# Patient Record
Sex: Female | Born: 1955 | Race: White | Hispanic: No | Marital: Married | Smoking: Former smoker
Health system: Southern US, Community
[De-identification: ages and names within clinical notes are randomized; demographics above are authoritative.]

## PROBLEM LIST (undated history)

## (undated) DIAGNOSIS — N184 Chronic kidney disease, stage 4 (severe): Secondary | ICD-10-CM

## (undated) DIAGNOSIS — J449 Chronic obstructive pulmonary disease, unspecified: Secondary | ICD-10-CM

## (undated) DIAGNOSIS — K7460 Unspecified cirrhosis of liver: Secondary | ICD-10-CM

## (undated) DIAGNOSIS — F32A Depression, unspecified: Secondary | ICD-10-CM

## (undated) DIAGNOSIS — K7581 Nonalcoholic steatohepatitis (NASH): Secondary | ICD-10-CM

## (undated) DIAGNOSIS — I10 Essential (primary) hypertension: Secondary | ICD-10-CM

## (undated) DIAGNOSIS — F329 Major depressive disorder, single episode, unspecified: Secondary | ICD-10-CM

## (undated) DIAGNOSIS — D696 Thrombocytopenia, unspecified: Secondary | ICD-10-CM

## (undated) DIAGNOSIS — N200 Calculus of kidney: Secondary | ICD-10-CM

## (undated) HISTORY — PX: CHOLECYSTECTOMY: SHX55

## (undated) HISTORY — PX: GASTRIC BYPASS: SHX52

---

## 2004-09-15 ENCOUNTER — Ambulatory Visit: Payer: Self-pay | Admitting: Oncology

## 2015-02-23 DIAGNOSIS — R918 Other nonspecific abnormal finding of lung field: Secondary | ICD-10-CM | POA: Diagnosis not present

## 2015-02-23 DIAGNOSIS — R252 Cramp and spasm: Secondary | ICD-10-CM | POA: Diagnosis not present

## 2015-02-23 DIAGNOSIS — R05 Cough: Secondary | ICD-10-CM | POA: Diagnosis not present

## 2015-02-23 DIAGNOSIS — K766 Portal hypertension: Secondary | ICD-10-CM | POA: Diagnosis not present

## 2015-02-23 DIAGNOSIS — R0789 Other chest pain: Secondary | ICD-10-CM | POA: Diagnosis not present

## 2015-02-23 DIAGNOSIS — K769 Liver disease, unspecified: Secondary | ICD-10-CM | POA: Diagnosis not present

## 2015-02-23 DIAGNOSIS — K7290 Hepatic failure, unspecified without coma: Secondary | ICD-10-CM | POA: Diagnosis not present

## 2015-02-23 DIAGNOSIS — Z23 Encounter for immunization: Secondary | ICD-10-CM | POA: Diagnosis not present

## 2015-02-23 DIAGNOSIS — D508 Other iron deficiency anemias: Secondary | ICD-10-CM | POA: Diagnosis not present

## 2015-02-23 DIAGNOSIS — Z87891 Personal history of nicotine dependence: Secondary | ICD-10-CM | POA: Diagnosis not present

## 2015-02-23 DIAGNOSIS — K7460 Unspecified cirrhosis of liver: Secondary | ICD-10-CM | POA: Diagnosis not present

## 2015-02-23 DIAGNOSIS — G4733 Obstructive sleep apnea (adult) (pediatric): Secondary | ICD-10-CM | POA: Diagnosis not present

## 2015-02-23 DIAGNOSIS — K7581 Nonalcoholic steatohepatitis (NASH): Secondary | ICD-10-CM | POA: Diagnosis not present

## 2015-02-23 DIAGNOSIS — R042 Hemoptysis: Secondary | ICD-10-CM | POA: Diagnosis not present

## 2015-02-23 DIAGNOSIS — I8510 Secondary esophageal varices without bleeding: Secondary | ICD-10-CM | POA: Diagnosis not present

## 2015-02-23 DIAGNOSIS — E8881 Metabolic syndrome: Secondary | ICD-10-CM | POA: Diagnosis not present

## 2015-03-07 DIAGNOSIS — D500 Iron deficiency anemia secondary to blood loss (chronic): Secondary | ICD-10-CM | POA: Diagnosis not present

## 2015-03-11 DIAGNOSIS — R938 Abnormal findings on diagnostic imaging of other specified body structures: Secondary | ICD-10-CM | POA: Diagnosis not present

## 2015-03-11 DIAGNOSIS — R05 Cough: Secondary | ICD-10-CM | POA: Diagnosis not present

## 2015-03-11 DIAGNOSIS — R042 Hemoptysis: Secondary | ICD-10-CM | POA: Diagnosis not present

## 2015-04-07 DIAGNOSIS — N3000 Acute cystitis without hematuria: Secondary | ICD-10-CM | POA: Diagnosis not present

## 2015-04-07 DIAGNOSIS — N3001 Acute cystitis with hematuria: Secondary | ICD-10-CM | POA: Diagnosis not present

## 2015-04-07 DIAGNOSIS — M159 Polyosteoarthritis, unspecified: Secondary | ICD-10-CM | POA: Diagnosis not present

## 2015-04-11 DIAGNOSIS — K7460 Unspecified cirrhosis of liver: Secondary | ICD-10-CM | POA: Diagnosis not present

## 2015-04-11 DIAGNOSIS — K7581 Nonalcoholic steatohepatitis (NASH): Secondary | ICD-10-CM | POA: Diagnosis not present

## 2015-04-12 DIAGNOSIS — K7460 Unspecified cirrhosis of liver: Secondary | ICD-10-CM | POA: Diagnosis not present

## 2015-04-12 DIAGNOSIS — R3129 Other microscopic hematuria: Secondary | ICD-10-CM | POA: Diagnosis not present

## 2015-04-12 DIAGNOSIS — M159 Polyosteoarthritis, unspecified: Secondary | ICD-10-CM | POA: Diagnosis not present

## 2015-04-12 DIAGNOSIS — E8881 Metabolic syndrome: Secondary | ICD-10-CM | POA: Diagnosis not present

## 2015-04-13 DIAGNOSIS — M5136 Other intervertebral disc degeneration, lumbar region: Secondary | ICD-10-CM | POA: Diagnosis not present

## 2015-04-13 DIAGNOSIS — M5442 Lumbago with sciatica, left side: Secondary | ICD-10-CM | POA: Diagnosis not present

## 2015-04-13 DIAGNOSIS — M5441 Lumbago with sciatica, right side: Secondary | ICD-10-CM | POA: Diagnosis not present

## 2015-04-13 DIAGNOSIS — G8929 Other chronic pain: Secondary | ICD-10-CM | POA: Diagnosis not present

## 2015-04-18 DIAGNOSIS — R079 Chest pain, unspecified: Secondary | ICD-10-CM | POA: Diagnosis not present

## 2015-04-18 DIAGNOSIS — R531 Weakness: Secondary | ICD-10-CM | POA: Diagnosis not present

## 2015-04-20 DIAGNOSIS — M5442 Lumbago with sciatica, left side: Secondary | ICD-10-CM | POA: Diagnosis not present

## 2015-04-20 DIAGNOSIS — M6281 Muscle weakness (generalized): Secondary | ICD-10-CM | POA: Diagnosis not present

## 2015-04-20 DIAGNOSIS — M5136 Other intervertebral disc degeneration, lumbar region: Secondary | ICD-10-CM | POA: Diagnosis not present

## 2015-05-10 DIAGNOSIS — M5442 Lumbago with sciatica, left side: Secondary | ICD-10-CM | POA: Diagnosis not present

## 2015-05-10 DIAGNOSIS — M5136 Other intervertebral disc degeneration, lumbar region: Secondary | ICD-10-CM | POA: Diagnosis not present

## 2015-05-11 DIAGNOSIS — M5442 Lumbago with sciatica, left side: Secondary | ICD-10-CM | POA: Diagnosis not present

## 2015-05-11 DIAGNOSIS — G8929 Other chronic pain: Secondary | ICD-10-CM | POA: Diagnosis not present

## 2015-05-11 DIAGNOSIS — M5441 Lumbago with sciatica, right side: Secondary | ICD-10-CM | POA: Diagnosis not present

## 2015-05-11 DIAGNOSIS — M5136 Other intervertebral disc degeneration, lumbar region: Secondary | ICD-10-CM | POA: Diagnosis not present

## 2015-05-13 DIAGNOSIS — M5136 Other intervertebral disc degeneration, lumbar region: Secondary | ICD-10-CM | POA: Diagnosis not present

## 2015-05-13 DIAGNOSIS — M5442 Lumbago with sciatica, left side: Secondary | ICD-10-CM | POA: Diagnosis not present

## 2015-05-23 DIAGNOSIS — R531 Weakness: Secondary | ICD-10-CM | POA: Diagnosis not present

## 2015-05-23 DIAGNOSIS — K7581 Nonalcoholic steatohepatitis (NASH): Secondary | ICD-10-CM | POA: Diagnosis not present

## 2015-05-23 DIAGNOSIS — E7220 Disorder of urea cycle metabolism, unspecified: Secondary | ICD-10-CM | POA: Diagnosis not present

## 2015-05-31 DIAGNOSIS — I10 Essential (primary) hypertension: Secondary | ICD-10-CM | POA: Diagnosis not present

## 2015-05-31 DIAGNOSIS — G4733 Obstructive sleep apnea (adult) (pediatric): Secondary | ICD-10-CM | POA: Diagnosis not present

## 2015-05-31 DIAGNOSIS — K7469 Other cirrhosis of liver: Secondary | ICD-10-CM | POA: Diagnosis not present

## 2015-05-31 DIAGNOSIS — K7581 Nonalcoholic steatohepatitis (NASH): Secondary | ICD-10-CM | POA: Diagnosis not present

## 2015-05-31 DIAGNOSIS — E1069 Type 1 diabetes mellitus with other specified complication: Secondary | ICD-10-CM | POA: Diagnosis not present

## 2015-05-31 DIAGNOSIS — Z87891 Personal history of nicotine dependence: Secondary | ICD-10-CM | POA: Diagnosis not present

## 2015-05-31 DIAGNOSIS — Z8639 Personal history of other endocrine, nutritional and metabolic disease: Secondary | ICD-10-CM | POA: Diagnosis not present

## 2015-05-31 DIAGNOSIS — K219 Gastro-esophageal reflux disease without esophagitis: Secondary | ICD-10-CM | POA: Diagnosis not present

## 2015-05-31 DIAGNOSIS — K7460 Unspecified cirrhosis of liver: Secondary | ICD-10-CM | POA: Diagnosis not present

## 2015-05-31 DIAGNOSIS — G2581 Restless legs syndrome: Secondary | ICD-10-CM | POA: Diagnosis not present

## 2015-06-26 DIAGNOSIS — D696 Thrombocytopenia, unspecified: Secondary | ICD-10-CM | POA: Diagnosis not present

## 2015-06-26 DIAGNOSIS — N179 Acute kidney failure, unspecified: Secondary | ICD-10-CM | POA: Diagnosis not present

## 2015-06-26 DIAGNOSIS — M1990 Unspecified osteoarthritis, unspecified site: Secondary | ICD-10-CM | POA: Diagnosis not present

## 2015-06-26 DIAGNOSIS — K7581 Nonalcoholic steatohepatitis (NASH): Secondary | ICD-10-CM | POA: Diagnosis not present

## 2015-06-26 DIAGNOSIS — G9341 Metabolic encephalopathy: Secondary | ICD-10-CM | POA: Diagnosis not present

## 2015-06-26 DIAGNOSIS — Z79899 Other long term (current) drug therapy: Secondary | ICD-10-CM | POA: Diagnosis not present

## 2015-06-26 DIAGNOSIS — I10 Essential (primary) hypertension: Secondary | ICD-10-CM | POA: Diagnosis not present

## 2015-06-26 DIAGNOSIS — K219 Gastro-esophageal reflux disease without esophagitis: Secondary | ICD-10-CM | POA: Diagnosis not present

## 2015-06-26 DIAGNOSIS — R079 Chest pain, unspecified: Secondary | ICD-10-CM | POA: Diagnosis not present

## 2015-06-26 DIAGNOSIS — K7290 Hepatic failure, unspecified without coma: Secondary | ICD-10-CM | POA: Diagnosis not present

## 2015-06-26 DIAGNOSIS — Z881 Allergy status to other antibiotic agents status: Secondary | ICD-10-CM | POA: Diagnosis not present

## 2015-06-26 DIAGNOSIS — Z888 Allergy status to other drugs, medicaments and biological substances status: Secondary | ICD-10-CM | POA: Diagnosis not present

## 2015-06-27 DIAGNOSIS — K7290 Hepatic failure, unspecified without coma: Secondary | ICD-10-CM | POA: Diagnosis not present

## 2015-06-27 DIAGNOSIS — K7581 Nonalcoholic steatohepatitis (NASH): Secondary | ICD-10-CM | POA: Diagnosis not present

## 2015-06-27 DIAGNOSIS — I10 Essential (primary) hypertension: Secondary | ICD-10-CM | POA: Diagnosis not present

## 2015-06-27 DIAGNOSIS — D696 Thrombocytopenia, unspecified: Secondary | ICD-10-CM | POA: Diagnosis not present

## 2015-06-28 DIAGNOSIS — K7581 Nonalcoholic steatohepatitis (NASH): Secondary | ICD-10-CM | POA: Diagnosis not present

## 2015-06-28 DIAGNOSIS — K7290 Hepatic failure, unspecified without coma: Secondary | ICD-10-CM | POA: Diagnosis not present

## 2015-06-28 DIAGNOSIS — I10 Essential (primary) hypertension: Secondary | ICD-10-CM | POA: Diagnosis not present

## 2015-06-28 DIAGNOSIS — D696 Thrombocytopenia, unspecified: Secondary | ICD-10-CM | POA: Diagnosis not present

## 2015-07-05 DIAGNOSIS — G4733 Obstructive sleep apnea (adult) (pediatric): Secondary | ICD-10-CM | POA: Diagnosis not present

## 2015-07-05 DIAGNOSIS — G2581 Restless legs syndrome: Secondary | ICD-10-CM | POA: Diagnosis not present

## 2015-07-05 DIAGNOSIS — R69 Illness, unspecified: Secondary | ICD-10-CM | POA: Diagnosis not present

## 2015-07-06 DIAGNOSIS — M159 Polyosteoarthritis, unspecified: Secondary | ICD-10-CM | POA: Diagnosis not present

## 2015-07-06 DIAGNOSIS — J301 Allergic rhinitis due to pollen: Secondary | ICD-10-CM | POA: Diagnosis not present

## 2015-07-06 DIAGNOSIS — E8881 Metabolic syndrome: Secondary | ICD-10-CM | POA: Diagnosis not present

## 2015-07-06 DIAGNOSIS — K7290 Hepatic failure, unspecified without coma: Secondary | ICD-10-CM | POA: Diagnosis not present

## 2015-07-06 DIAGNOSIS — M5136 Other intervertebral disc degeneration, lumbar region: Secondary | ICD-10-CM | POA: Diagnosis not present

## 2015-07-06 DIAGNOSIS — K7460 Unspecified cirrhosis of liver: Secondary | ICD-10-CM | POA: Diagnosis not present

## 2015-07-06 DIAGNOSIS — M5442 Lumbago with sciatica, left side: Secondary | ICD-10-CM | POA: Diagnosis not present

## 2015-07-06 DIAGNOSIS — G8929 Other chronic pain: Secondary | ICD-10-CM | POA: Diagnosis not present

## 2015-07-06 DIAGNOSIS — I119 Hypertensive heart disease without heart failure: Secondary | ICD-10-CM | POA: Diagnosis not present

## 2015-07-06 DIAGNOSIS — M5441 Lumbago with sciatica, right side: Secondary | ICD-10-CM | POA: Diagnosis not present

## 2015-07-06 DIAGNOSIS — K760 Fatty (change of) liver, not elsewhere classified: Secondary | ICD-10-CM | POA: Diagnosis not present

## 2015-07-06 DIAGNOSIS — N179 Acute kidney failure, unspecified: Secondary | ICD-10-CM | POA: Diagnosis not present

## 2015-07-15 DIAGNOSIS — M545 Low back pain: Secondary | ICD-10-CM | POA: Diagnosis not present

## 2015-07-19 DIAGNOSIS — K625 Hemorrhage of anus and rectum: Secondary | ICD-10-CM | POA: Diagnosis not present

## 2015-07-22 DIAGNOSIS — M5442 Lumbago with sciatica, left side: Secondary | ICD-10-CM | POA: Diagnosis not present

## 2015-07-22 DIAGNOSIS — M5441 Lumbago with sciatica, right side: Secondary | ICD-10-CM | POA: Diagnosis not present

## 2015-07-22 DIAGNOSIS — G8929 Other chronic pain: Secondary | ICD-10-CM | POA: Diagnosis not present

## 2015-07-22 DIAGNOSIS — M5136 Other intervertebral disc degeneration, lumbar region: Secondary | ICD-10-CM | POA: Diagnosis not present

## 2015-08-12 DIAGNOSIS — M4726 Other spondylosis with radiculopathy, lumbar region: Secondary | ICD-10-CM | POA: Diagnosis not present

## 2015-08-12 DIAGNOSIS — M5136 Other intervertebral disc degeneration, lumbar region: Secondary | ICD-10-CM | POA: Diagnosis not present

## 2015-08-12 DIAGNOSIS — M5116 Intervertebral disc disorders with radiculopathy, lumbar region: Secondary | ICD-10-CM | POA: Diagnosis not present

## 2015-08-25 DIAGNOSIS — G4761 Periodic limb movement disorder: Secondary | ICD-10-CM | POA: Diagnosis not present

## 2015-09-05 DIAGNOSIS — M5441 Lumbago with sciatica, right side: Secondary | ICD-10-CM | POA: Diagnosis not present

## 2015-09-05 DIAGNOSIS — M5136 Other intervertebral disc degeneration, lumbar region: Secondary | ICD-10-CM | POA: Diagnosis not present

## 2015-09-05 DIAGNOSIS — G8929 Other chronic pain: Secondary | ICD-10-CM | POA: Diagnosis not present

## 2015-09-05 DIAGNOSIS — M5442 Lumbago with sciatica, left side: Secondary | ICD-10-CM | POA: Diagnosis not present

## 2015-09-12 DIAGNOSIS — E7220 Disorder of urea cycle metabolism, unspecified: Secondary | ICD-10-CM | POA: Diagnosis not present

## 2015-09-12 DIAGNOSIS — R0600 Dyspnea, unspecified: Secondary | ICD-10-CM | POA: Diagnosis not present

## 2015-09-12 DIAGNOSIS — R0602 Shortness of breath: Secondary | ICD-10-CM | POA: Diagnosis not present

## 2015-09-13 DIAGNOSIS — R0602 Shortness of breath: Secondary | ICD-10-CM | POA: Diagnosis not present

## 2015-09-13 DIAGNOSIS — R0600 Dyspnea, unspecified: Secondary | ICD-10-CM | POA: Diagnosis not present

## 2015-09-28 DIAGNOSIS — R0789 Other chest pain: Secondary | ICD-10-CM | POA: Diagnosis not present

## 2015-09-28 DIAGNOSIS — K7581 Nonalcoholic steatohepatitis (NASH): Secondary | ICD-10-CM | POA: Diagnosis not present

## 2015-09-28 DIAGNOSIS — K7460 Unspecified cirrhosis of liver: Secondary | ICD-10-CM | POA: Diagnosis not present

## 2015-09-28 DIAGNOSIS — Z8639 Personal history of other endocrine, nutritional and metabolic disease: Secondary | ICD-10-CM | POA: Diagnosis not present

## 2015-09-28 DIAGNOSIS — R5383 Other fatigue: Secondary | ICD-10-CM | POA: Diagnosis not present

## 2015-09-28 DIAGNOSIS — R0609 Other forms of dyspnea: Secondary | ICD-10-CM | POA: Diagnosis not present

## 2015-09-28 DIAGNOSIS — R600 Localized edema: Secondary | ICD-10-CM | POA: Diagnosis not present

## 2015-10-06 DIAGNOSIS — G8929 Other chronic pain: Secondary | ICD-10-CM | POA: Diagnosis not present

## 2015-10-06 DIAGNOSIS — M5441 Lumbago with sciatica, right side: Secondary | ICD-10-CM | POA: Diagnosis not present

## 2015-10-06 DIAGNOSIS — M5136 Other intervertebral disc degeneration, lumbar region: Secondary | ICD-10-CM | POA: Diagnosis not present

## 2015-10-06 DIAGNOSIS — M5442 Lumbago with sciatica, left side: Secondary | ICD-10-CM | POA: Diagnosis not present

## 2015-10-07 DIAGNOSIS — R0789 Other chest pain: Secondary | ICD-10-CM | POA: Diagnosis not present

## 2015-10-07 DIAGNOSIS — Z6841 Body Mass Index (BMI) 40.0 and over, adult: Secondary | ICD-10-CM | POA: Diagnosis not present

## 2015-10-07 DIAGNOSIS — R0602 Shortness of breath: Secondary | ICD-10-CM | POA: Diagnosis not present

## 2015-10-07 DIAGNOSIS — R079 Chest pain, unspecified: Secondary | ICD-10-CM | POA: Diagnosis not present

## 2015-10-12 DIAGNOSIS — Z0100 Encounter for examination of eyes and vision without abnormal findings: Secondary | ICD-10-CM | POA: Diagnosis not present

## 2015-10-12 DIAGNOSIS — H5213 Myopia, bilateral: Secondary | ICD-10-CM | POA: Diagnosis not present

## 2015-10-14 DIAGNOSIS — K766 Portal hypertension: Secondary | ICD-10-CM | POA: Diagnosis not present

## 2015-10-14 DIAGNOSIS — K769 Liver disease, unspecified: Secondary | ICD-10-CM | POA: Diagnosis not present

## 2015-10-14 DIAGNOSIS — K7460 Unspecified cirrhosis of liver: Secondary | ICD-10-CM | POA: Diagnosis not present

## 2015-10-14 DIAGNOSIS — R0609 Other forms of dyspnea: Secondary | ICD-10-CM | POA: Diagnosis not present

## 2015-10-14 DIAGNOSIS — L299 Pruritus, unspecified: Secondary | ICD-10-CM | POA: Diagnosis not present

## 2015-10-14 DIAGNOSIS — G2581 Restless legs syndrome: Secondary | ICD-10-CM | POA: Diagnosis not present

## 2015-10-14 DIAGNOSIS — H579 Unspecified disorder of eye and adnexa: Secondary | ICD-10-CM | POA: Diagnosis not present

## 2015-10-14 DIAGNOSIS — K7469 Other cirrhosis of liver: Secondary | ICD-10-CM | POA: Diagnosis not present

## 2015-10-14 DIAGNOSIS — R600 Localized edema: Secondary | ICD-10-CM | POA: Diagnosis not present

## 2015-10-14 DIAGNOSIS — K7290 Hepatic failure, unspecified without coma: Secondary | ICD-10-CM | POA: Diagnosis not present

## 2015-11-02 DIAGNOSIS — G4719 Other hypersomnia: Secondary | ICD-10-CM | POA: Diagnosis not present

## 2015-11-02 DIAGNOSIS — Z72821 Inadequate sleep hygiene: Secondary | ICD-10-CM | POA: Diagnosis not present

## 2015-11-02 DIAGNOSIS — G2581 Restless legs syndrome: Secondary | ICD-10-CM | POA: Diagnosis not present

## 2015-11-02 DIAGNOSIS — R69 Illness, unspecified: Secondary | ICD-10-CM | POA: Diagnosis not present

## 2015-11-08 DIAGNOSIS — E8770 Fluid overload, unspecified: Secondary | ICD-10-CM | POA: Diagnosis not present

## 2015-11-08 DIAGNOSIS — N3000 Acute cystitis without hematuria: Secondary | ICD-10-CM | POA: Diagnosis not present

## 2015-11-08 DIAGNOSIS — N390 Urinary tract infection, site not specified: Secondary | ICD-10-CM | POA: Diagnosis not present

## 2015-11-08 DIAGNOSIS — I10 Essential (primary) hypertension: Secondary | ICD-10-CM | POA: Diagnosis not present

## 2015-11-08 DIAGNOSIS — A419 Sepsis, unspecified organism: Secondary | ICD-10-CM | POA: Diagnosis not present

## 2015-11-08 DIAGNOSIS — N179 Acute kidney failure, unspecified: Secondary | ICD-10-CM | POA: Diagnosis not present

## 2015-11-08 DIAGNOSIS — R531 Weakness: Secondary | ICD-10-CM | POA: Diagnosis not present

## 2015-11-08 DIAGNOSIS — K7581 Nonalcoholic steatohepatitis (NASH): Secondary | ICD-10-CM | POA: Diagnosis not present

## 2015-11-08 DIAGNOSIS — D696 Thrombocytopenia, unspecified: Secondary | ICD-10-CM | POA: Diagnosis not present

## 2015-11-08 DIAGNOSIS — R404 Transient alteration of awareness: Secondary | ICD-10-CM | POA: Diagnosis not present

## 2015-11-08 DIAGNOSIS — I8500 Esophageal varices without bleeding: Secondary | ICD-10-CM | POA: Diagnosis not present

## 2015-11-08 DIAGNOSIS — B9620 Unspecified Escherichia coli [E. coli] as the cause of diseases classified elsewhere: Secondary | ICD-10-CM | POA: Diagnosis not present

## 2015-11-08 DIAGNOSIS — I361 Nonrheumatic tricuspid (valve) insufficiency: Secondary | ICD-10-CM | POA: Diagnosis not present

## 2015-11-08 DIAGNOSIS — E860 Dehydration: Secondary | ICD-10-CM | POA: Diagnosis not present

## 2015-11-08 DIAGNOSIS — Z6841 Body Mass Index (BMI) 40.0 and over, adult: Secondary | ICD-10-CM | POA: Diagnosis not present

## 2015-11-08 DIAGNOSIS — K7290 Hepatic failure, unspecified without coma: Secondary | ICD-10-CM | POA: Diagnosis not present

## 2015-11-09 DIAGNOSIS — I361 Nonrheumatic tricuspid (valve) insufficiency: Secondary | ICD-10-CM | POA: Diagnosis not present

## 2015-11-09 DIAGNOSIS — N179 Acute kidney failure, unspecified: Secondary | ICD-10-CM | POA: Diagnosis not present

## 2015-11-09 DIAGNOSIS — D696 Thrombocytopenia, unspecified: Secondary | ICD-10-CM

## 2015-11-09 DIAGNOSIS — N390 Urinary tract infection, site not specified: Secondary | ICD-10-CM | POA: Diagnosis not present

## 2015-11-09 DIAGNOSIS — A419 Sepsis, unspecified organism: Secondary | ICD-10-CM | POA: Diagnosis not present

## 2015-11-09 DIAGNOSIS — K7581 Nonalcoholic steatohepatitis (NASH): Secondary | ICD-10-CM

## 2015-11-09 DIAGNOSIS — K7290 Hepatic failure, unspecified without coma: Secondary | ICD-10-CM | POA: Diagnosis not present

## 2015-11-10 DIAGNOSIS — K7290 Hepatic failure, unspecified without coma: Secondary | ICD-10-CM | POA: Diagnosis not present

## 2015-11-10 DIAGNOSIS — A419 Sepsis, unspecified organism: Secondary | ICD-10-CM | POA: Diagnosis not present

## 2015-11-10 DIAGNOSIS — N179 Acute kidney failure, unspecified: Secondary | ICD-10-CM | POA: Diagnosis not present

## 2015-11-10 DIAGNOSIS — N390 Urinary tract infection, site not specified: Secondary | ICD-10-CM | POA: Diagnosis not present

## 2015-11-11 DIAGNOSIS — N179 Acute kidney failure, unspecified: Secondary | ICD-10-CM | POA: Diagnosis not present

## 2015-11-11 DIAGNOSIS — K7290 Hepatic failure, unspecified without coma: Secondary | ICD-10-CM | POA: Diagnosis not present

## 2015-11-11 DIAGNOSIS — A419 Sepsis, unspecified organism: Secondary | ICD-10-CM | POA: Diagnosis not present

## 2015-11-11 DIAGNOSIS — N390 Urinary tract infection, site not specified: Secondary | ICD-10-CM | POA: Diagnosis not present

## 2015-11-18 DIAGNOSIS — K644 Residual hemorrhoidal skin tags: Secondary | ICD-10-CM | POA: Diagnosis not present

## 2015-11-18 DIAGNOSIS — R69 Illness, unspecified: Secondary | ICD-10-CM | POA: Diagnosis not present

## 2015-11-18 DIAGNOSIS — K760 Fatty (change of) liver, not elsewhere classified: Secondary | ICD-10-CM | POA: Diagnosis not present

## 2015-11-18 DIAGNOSIS — G9341 Metabolic encephalopathy: Secondary | ICD-10-CM | POA: Diagnosis not present

## 2015-11-18 DIAGNOSIS — N3001 Acute cystitis with hematuria: Secondary | ICD-10-CM | POA: Diagnosis not present

## 2015-11-18 DIAGNOSIS — A4151 Sepsis due to Escherichia coli [E. coli]: Secondary | ICD-10-CM | POA: Diagnosis not present

## 2015-11-18 DIAGNOSIS — G2581 Restless legs syndrome: Secondary | ICD-10-CM | POA: Diagnosis not present

## 2015-11-18 DIAGNOSIS — I119 Hypertensive heart disease without heart failure: Secondary | ICD-10-CM | POA: Diagnosis not present

## 2015-11-18 DIAGNOSIS — K7200 Acute and subacute hepatic failure without coma: Secondary | ICD-10-CM | POA: Diagnosis not present

## 2015-11-18 DIAGNOSIS — K7460 Unspecified cirrhosis of liver: Secondary | ICD-10-CM | POA: Diagnosis not present

## 2015-12-06 DIAGNOSIS — G894 Chronic pain syndrome: Secondary | ICD-10-CM | POA: Diagnosis not present

## 2015-12-06 DIAGNOSIS — M5136 Other intervertebral disc degeneration, lumbar region: Secondary | ICD-10-CM | POA: Diagnosis not present

## 2015-12-06 DIAGNOSIS — M5442 Lumbago with sciatica, left side: Secondary | ICD-10-CM | POA: Diagnosis not present

## 2015-12-19 DIAGNOSIS — I10 Essential (primary) hypertension: Secondary | ICD-10-CM

## 2015-12-19 DIAGNOSIS — R0602 Shortness of breath: Secondary | ICD-10-CM | POA: Diagnosis not present

## 2015-12-19 DIAGNOSIS — K7581 Nonalcoholic steatohepatitis (NASH): Secondary | ICD-10-CM | POA: Diagnosis not present

## 2015-12-19 DIAGNOSIS — G2581 Restless legs syndrome: Secondary | ICD-10-CM | POA: Diagnosis not present

## 2015-12-19 DIAGNOSIS — Z79899 Other long term (current) drug therapy: Secondary | ICD-10-CM | POA: Diagnosis not present

## 2015-12-19 DIAGNOSIS — M1990 Unspecified osteoarthritis, unspecified site: Secondary | ICD-10-CM | POA: Diagnosis not present

## 2015-12-19 DIAGNOSIS — N183 Chronic kidney disease, stage 3 (moderate): Secondary | ICD-10-CM | POA: Diagnosis not present

## 2015-12-19 DIAGNOSIS — I129 Hypertensive chronic kidney disease with stage 1 through stage 4 chronic kidney disease, or unspecified chronic kidney disease: Secondary | ICD-10-CM | POA: Diagnosis not present

## 2015-12-19 DIAGNOSIS — K7290 Hepatic failure, unspecified without coma: Secondary | ICD-10-CM | POA: Diagnosis not present

## 2015-12-19 DIAGNOSIS — Z87891 Personal history of nicotine dependence: Secondary | ICD-10-CM | POA: Diagnosis not present

## 2015-12-19 DIAGNOSIS — K766 Portal hypertension: Secondary | ICD-10-CM | POA: Diagnosis not present

## 2015-12-19 DIAGNOSIS — D696 Thrombocytopenia, unspecified: Secondary | ICD-10-CM | POA: Diagnosis not present

## 2015-12-20 DIAGNOSIS — N183 Chronic kidney disease, stage 3 (moderate): Secondary | ICD-10-CM | POA: Diagnosis not present

## 2015-12-20 DIAGNOSIS — M1990 Unspecified osteoarthritis, unspecified site: Secondary | ICD-10-CM | POA: Diagnosis not present

## 2015-12-20 DIAGNOSIS — K7290 Hepatic failure, unspecified without coma: Secondary | ICD-10-CM | POA: Diagnosis not present

## 2015-12-28 DIAGNOSIS — Z79899 Other long term (current) drug therapy: Secondary | ICD-10-CM | POA: Diagnosis not present

## 2015-12-28 DIAGNOSIS — R600 Localized edema: Secondary | ICD-10-CM | POA: Diagnosis not present

## 2015-12-28 DIAGNOSIS — K7460 Unspecified cirrhosis of liver: Secondary | ICD-10-CM | POA: Diagnosis not present

## 2015-12-28 DIAGNOSIS — R350 Frequency of micturition: Secondary | ICD-10-CM | POA: Diagnosis not present

## 2015-12-28 DIAGNOSIS — E119 Type 2 diabetes mellitus without complications: Secondary | ICD-10-CM | POA: Diagnosis not present

## 2015-12-28 DIAGNOSIS — K7469 Other cirrhosis of liver: Secondary | ICD-10-CM | POA: Diagnosis not present

## 2015-12-28 DIAGNOSIS — W101XXA Fall (on)(from) sidewalk curb, initial encounter: Secondary | ICD-10-CM | POA: Diagnosis not present

## 2015-12-28 DIAGNOSIS — S8011XA Contusion of right lower leg, initial encounter: Secondary | ICD-10-CM | POA: Diagnosis not present

## 2015-12-28 DIAGNOSIS — I8510 Secondary esophageal varices without bleeding: Secondary | ICD-10-CM | POA: Diagnosis not present

## 2015-12-28 DIAGNOSIS — K7581 Nonalcoholic steatohepatitis (NASH): Secondary | ICD-10-CM | POA: Diagnosis not present

## 2015-12-28 DIAGNOSIS — K766 Portal hypertension: Secondary | ICD-10-CM | POA: Diagnosis not present

## 2016-01-02 DIAGNOSIS — M25561 Pain in right knee: Secondary | ICD-10-CM | POA: Diagnosis not present

## 2016-01-02 DIAGNOSIS — K7200 Acute and subacute hepatic failure without coma: Secondary | ICD-10-CM | POA: Diagnosis not present

## 2016-01-02 DIAGNOSIS — S80811A Abrasion, right lower leg, initial encounter: Secondary | ICD-10-CM | POA: Diagnosis not present

## 2016-01-02 DIAGNOSIS — S8011XA Contusion of right lower leg, initial encounter: Secondary | ICD-10-CM | POA: Diagnosis not present

## 2016-01-02 DIAGNOSIS — G9341 Metabolic encephalopathy: Secondary | ICD-10-CM | POA: Diagnosis not present

## 2016-01-06 DIAGNOSIS — N184 Chronic kidney disease, stage 4 (severe): Secondary | ICD-10-CM | POA: Diagnosis present

## 2016-01-06 HISTORY — DX: Chronic kidney disease, stage 4 (severe): N18.4

## 2016-01-15 ENCOUNTER — Inpatient Hospital Stay (HOSPITAL_COMMUNITY)
Admission: AD | Admit: 2016-01-15 | Discharge: 2016-01-21 | DRG: 377 | Disposition: A | Discharge status: Home / Self Care | Payer: Medicare HMO | Source: Other Acute Inpatient Hospital | Attending: Internal Medicine | Admitting: Internal Medicine

## 2016-01-15 DIAGNOSIS — E43 Unspecified severe protein-calorie malnutrition: Secondary | ICD-10-CM | POA: Diagnosis not present

## 2016-01-15 DIAGNOSIS — Z452 Encounter for adjustment and management of vascular access device: Secondary | ICD-10-CM

## 2016-01-15 DIAGNOSIS — E875 Hyperkalemia: Secondary | ICD-10-CM | POA: Diagnosis present

## 2016-01-15 DIAGNOSIS — Z79899 Other long term (current) drug therapy: Secondary | ICD-10-CM | POA: Diagnosis not present

## 2016-01-15 DIAGNOSIS — K2970 Gastritis, unspecified, without bleeding: Secondary | ICD-10-CM | POA: Diagnosis not present

## 2016-01-15 DIAGNOSIS — G9340 Encephalopathy, unspecified: Secondary | ICD-10-CM | POA: Diagnosis not present

## 2016-01-15 DIAGNOSIS — I129 Hypertensive chronic kidney disease with stage 1 through stage 4 chronic kidney disease, or unspecified chronic kidney disease: Secondary | ICD-10-CM | POA: Diagnosis present

## 2016-01-15 DIAGNOSIS — K625 Hemorrhage of anus and rectum: Secondary | ICD-10-CM | POA: Diagnosis not present

## 2016-01-15 DIAGNOSIS — R197 Diarrhea, unspecified: Secondary | ICD-10-CM | POA: Diagnosis not present

## 2016-01-15 DIAGNOSIS — N170 Acute kidney failure with tubular necrosis: Secondary | ICD-10-CM | POA: Diagnosis not present

## 2016-01-15 DIAGNOSIS — G2581 Restless legs syndrome: Secondary | ICD-10-CM | POA: Diagnosis present

## 2016-01-15 DIAGNOSIS — E872 Acidosis: Secondary | ICD-10-CM | POA: Diagnosis not present

## 2016-01-15 DIAGNOSIS — Z6841 Body Mass Index (BMI) 40.0 and over, adult: Secondary | ICD-10-CM

## 2016-01-15 DIAGNOSIS — R112 Nausea with vomiting, unspecified: Secondary | ICD-10-CM | POA: Diagnosis not present

## 2016-01-15 DIAGNOSIS — D689 Coagulation defect, unspecified: Secondary | ICD-10-CM | POA: Diagnosis not present

## 2016-01-15 DIAGNOSIS — D649 Anemia, unspecified: Secondary | ICD-10-CM | POA: Diagnosis not present

## 2016-01-15 DIAGNOSIS — Z9884 Bariatric surgery status: Secondary | ICD-10-CM

## 2016-01-15 DIAGNOSIS — R571 Hypovolemic shock: Secondary | ICD-10-CM | POA: Diagnosis present

## 2016-01-15 DIAGNOSIS — K922 Gastrointestinal hemorrhage, unspecified: Secondary | ICD-10-CM | POA: Diagnosis not present

## 2016-01-15 DIAGNOSIS — N179 Acute kidney failure, unspecified: Secondary | ICD-10-CM | POA: Diagnosis present

## 2016-01-15 DIAGNOSIS — F329 Major depressive disorder, single episode, unspecified: Secondary | ICD-10-CM | POA: Diagnosis present

## 2016-01-15 DIAGNOSIS — K284 Chronic or unspecified gastrojejunal ulcer with hemorrhage: Secondary | ICD-10-CM | POA: Diagnosis present

## 2016-01-15 DIAGNOSIS — Z87891 Personal history of nicotine dependence: Secondary | ICD-10-CM

## 2016-01-15 DIAGNOSIS — K7581 Nonalcoholic steatohepatitis (NASH): Secondary | ICD-10-CM | POA: Diagnosis not present

## 2016-01-15 DIAGNOSIS — D500 Iron deficiency anemia secondary to blood loss (chronic): Secondary | ICD-10-CM | POA: Diagnosis not present

## 2016-01-15 DIAGNOSIS — G9341 Metabolic encephalopathy: Secondary | ICD-10-CM | POA: Diagnosis not present

## 2016-01-15 DIAGNOSIS — N189 Chronic kidney disease, unspecified: Secondary | ICD-10-CM | POA: Diagnosis present

## 2016-01-15 DIAGNOSIS — D62 Acute posthemorrhagic anemia: Secondary | ICD-10-CM | POA: Diagnosis present

## 2016-01-15 DIAGNOSIS — K2961 Other gastritis with bleeding: Secondary | ICD-10-CM | POA: Diagnosis not present

## 2016-01-15 DIAGNOSIS — K254 Chronic or unspecified gastric ulcer with hemorrhage: Principal | ICD-10-CM | POA: Diagnosis present

## 2016-01-15 DIAGNOSIS — N289 Disorder of kidney and ureter, unspecified: Secondary | ICD-10-CM | POA: Diagnosis not present

## 2016-01-15 DIAGNOSIS — G4733 Obstructive sleep apnea (adult) (pediatric): Secondary | ICD-10-CM | POA: Diagnosis present

## 2016-01-15 DIAGNOSIS — R578 Other shock: Secondary | ICD-10-CM | POA: Diagnosis present

## 2016-01-15 DIAGNOSIS — E669 Obesity, unspecified: Secondary | ICD-10-CM | POA: Diagnosis present

## 2016-01-15 DIAGNOSIS — K7469 Other cirrhosis of liver: Secondary | ICD-10-CM | POA: Diagnosis present

## 2016-01-15 DIAGNOSIS — D6959 Other secondary thrombocytopenia: Secondary | ICD-10-CM | POA: Diagnosis present

## 2016-01-15 DIAGNOSIS — K289 Gastrojejunal ulcer, unspecified as acute or chronic, without hemorrhage or perforation: Secondary | ICD-10-CM | POA: Diagnosis not present

## 2016-01-15 DIAGNOSIS — R579 Shock, unspecified: Secondary | ICD-10-CM | POA: Diagnosis not present

## 2016-01-15 DIAGNOSIS — K259 Gastric ulcer, unspecified as acute or chronic, without hemorrhage or perforation: Secondary | ICD-10-CM | POA: Diagnosis not present

## 2016-01-15 DIAGNOSIS — I8510 Secondary esophageal varices without bleeding: Secondary | ICD-10-CM | POA: Diagnosis present

## 2016-01-15 DIAGNOSIS — K921 Melena: Secondary | ICD-10-CM | POA: Diagnosis not present

## 2016-01-15 DIAGNOSIS — K7460 Unspecified cirrhosis of liver: Secondary | ICD-10-CM | POA: Diagnosis not present

## 2016-01-15 HISTORY — DX: Unspecified cirrhosis of liver: K74.60

## 2016-01-15 HISTORY — DX: Unspecified cirrhosis of liver: K75.81

## 2016-01-15 LAB — GLUCOSE, CAPILLARY: Glucose-Capillary: 100 mg/dL — ABNORMAL HIGH (ref 65–99)

## 2016-01-15 MED ORDER — ALBUMIN HUMAN 25 % IV SOLN
12.5000 g | Freq: Once | INTRAVENOUS | Status: DC
  Filled 2016-01-15: qty 50

## 2016-01-15 MED ORDER — SODIUM CHLORIDE 0.9 % IV BOLUS (SEPSIS)
1000.0000 mL | Freq: Once | INTRAVENOUS | Status: AC
  Administered 2016-01-15: 1000 mL via INTRAVENOUS

## 2016-01-15 MED ORDER — SODIUM CHLORIDE 0.9 % IV SOLN
Freq: Once | INTRAVENOUS | Status: AC
  Administered 2016-01-16: 01:00:00 via INTRAVENOUS

## 2016-01-15 MED ORDER — HYDROCORTISONE NA SUCCINATE PF 100 MG IJ SOLR
100.0000 mg | Freq: Once | INTRAMUSCULAR | Status: AC
  Administered 2016-01-15: 100 mg via INTRAVENOUS
  Filled 2016-01-15: qty 2

## 2016-01-15 MED ORDER — ONDANSETRON HCL 4 MG/2ML IJ SOLN
4.0000 mg | Freq: Once | INTRAMUSCULAR | Status: AC
  Administered 2016-01-15: 4 mg via INTRAVENOUS
  Filled 2016-01-15: qty 2

## 2016-01-16 ENCOUNTER — Encounter (HOSPITAL_COMMUNITY): Payer: Self-pay | Admitting: Internal Medicine

## 2016-01-16 ENCOUNTER — Inpatient Hospital Stay (HOSPITAL_COMMUNITY): Payer: Medicare HMO

## 2016-01-16 ENCOUNTER — Encounter (HOSPITAL_COMMUNITY)
Admission: AD | Disposition: A | Discharge status: Home / Self Care | Payer: Self-pay | Source: Other Acute Inpatient Hospital | Attending: Pulmonary Disease

## 2016-01-16 DIAGNOSIS — E875 Hyperkalemia: Secondary | ICD-10-CM

## 2016-01-16 DIAGNOSIS — K922 Gastrointestinal hemorrhage, unspecified: Secondary | ICD-10-CM

## 2016-01-16 DIAGNOSIS — Z452 Encounter for adjustment and management of vascular access device: Secondary | ICD-10-CM

## 2016-01-16 DIAGNOSIS — R571 Hypovolemic shock: Secondary | ICD-10-CM

## 2016-01-16 DIAGNOSIS — I8510 Secondary esophageal varices without bleeding: Secondary | ICD-10-CM

## 2016-01-16 DIAGNOSIS — D62 Acute posthemorrhagic anemia: Secondary | ICD-10-CM

## 2016-01-16 DIAGNOSIS — G9340 Encephalopathy, unspecified: Secondary | ICD-10-CM

## 2016-01-16 DIAGNOSIS — K7581 Nonalcoholic steatohepatitis (NASH): Secondary | ICD-10-CM

## 2016-01-16 DIAGNOSIS — K2961 Other gastritis with bleeding: Secondary | ICD-10-CM | POA: Diagnosis present

## 2016-01-16 DIAGNOSIS — R579 Shock, unspecified: Secondary | ICD-10-CM

## 2016-01-16 DIAGNOSIS — K7460 Unspecified cirrhosis of liver: Secondary | ICD-10-CM

## 2016-01-16 HISTORY — PX: ESOPHAGOGASTRODUODENOSCOPY: SHX5428

## 2016-01-16 LAB — POCT I-STAT 3, ART BLOOD GAS (G3+)
BICARBONATE: 23.2 mmol/L (ref 20.0–28.0)
O2 Saturation: 100 %
PCO2 ART: 31.2 mmHg — AB (ref 32.0–48.0)
PH ART: 7.479 — AB (ref 7.350–7.450)
PO2 ART: 210 mmHg — AB (ref 83.0–108.0)
Patient temperature: 98.6
TCO2: 24 mmol/L (ref 0–100)

## 2016-01-16 LAB — BASIC METABOLIC PANEL
ANION GAP: 9 (ref 5–15)
ANION GAP: 9 (ref 5–15)
ANION GAP: 7 (ref 5–15)
BUN: 39 mg/dL — AB (ref 6–20)
BUN: 40 mg/dL — AB (ref 6–20)
BUN: 39 mg/dL — AB (ref 6–20)
CALCIUM: 8.7 mg/dL — AB (ref 8.9–10.3)
CALCIUM: 8.5 mg/dL — AB (ref 8.9–10.3)
CALCIUM: 8.4 mg/dL — AB (ref 8.9–10.3)
CO2: 20 mmol/L — AB (ref 22–32)
CO2: 22 mmol/L (ref 22–32)
CO2: 23 mmol/L (ref 22–32)
CREATININE: 2.64 mg/dL — AB (ref 0.44–1.00)
Chloride: 115 mmol/L — ABNORMAL HIGH (ref 101–111)
Chloride: 111 mmol/L (ref 101–111)
Chloride: 111 mmol/L (ref 101–111)
Creatinine, Ser: 2.7 mg/dL — ABNORMAL HIGH (ref 0.44–1.00)
Creatinine, Ser: 2.76 mg/dL — ABNORMAL HIGH (ref 0.44–1.00)
GFR calc Af Amer: 22 mL/min — ABNORMAL LOW (ref >60)
GFR calc Af Amer: 21 mL/min — ABNORMAL LOW (ref >60)
GFR calc Af Amer: 21 mL/min — ABNORMAL LOW (ref >60)
GFR calc non Af Amer: 18 mL/min — ABNORMAL LOW (ref >60)
GFR calc non Af Amer: 18 mL/min — ABNORMAL LOW (ref >60)
GFR, EST NON AFRICAN AMERICAN: 19 mL/min — AB (ref >60)
GLUCOSE: 174 mg/dL — AB (ref 65–99)
GLUCOSE: 162 mg/dL — AB (ref 65–99)
GLUCOSE: 187 mg/dL — AB (ref 65–99)
Potassium: 5.7 mmol/L — ABNORMAL HIGH (ref 3.5–5.1)
Potassium: 5.1 mmol/L (ref 3.5–5.1)
Potassium: 5 mmol/L (ref 3.5–5.1)
Sodium: 144 mmol/L (ref 135–145)
Sodium: 142 mmol/L (ref 135–145)
Sodium: 141 mmol/L (ref 135–145)

## 2016-01-16 LAB — CBC
HCT: 30.3 % — ABNORMAL LOW (ref 36.0–46.0)
HEMATOCRIT: 29.7 % — AB (ref 36.0–46.0)
HEMATOCRIT: 31.5 % — AB (ref 36.0–46.0)
HEMOGLOBIN: 10.3 g/dL — AB (ref 12.0–15.0)
HEMOGLOBIN: 10.3 g/dL — AB (ref 12.0–15.0)
HEMOGLOBIN: 10.7 g/dL — AB (ref 12.0–15.0)
MCH: 30.3 pg (ref 26.0–34.0)
MCH: 30.4 pg (ref 26.0–34.0)
MCH: 30.2 pg (ref 26.0–34.0)
MCHC: 34.7 g/dL (ref 30.0–36.0)
MCHC: 34 g/dL (ref 30.0–36.0)
MCHC: 34 g/dL (ref 30.0–36.0)
MCV: 87.6 fL (ref 78.0–100.0)
MCV: 89 fL (ref 78.0–100.0)
MCV: 89.1 fL (ref 78.0–100.0)
PLATELETS: 68 10*3/uL — AB (ref 150–400)
Platelets: 61 10*3/uL — ABNORMAL LOW (ref 150–400)
Platelets: 68 10*3/uL — ABNORMAL LOW (ref 150–400)
RBC: 3.39 MIL/uL — AB (ref 3.87–5.11)
RBC: 3.4 MIL/uL — AB (ref 3.87–5.11)
RBC: 3.54 MIL/uL — ABNORMAL LOW (ref 3.87–5.11)
RDW: 15.5 % (ref 11.5–15.5)
RDW: 15.6 % — ABNORMAL HIGH (ref 11.5–15.5)
RDW: 15.8 % — ABNORMAL HIGH (ref 11.5–15.5)
WBC: 13.4 10*3/uL — AB (ref 4.0–10.5)
WBC: 14.6 10*3/uL — AB (ref 4.0–10.5)
WBC: 14.9 10*3/uL — ABNORMAL HIGH (ref 4.0–10.5)

## 2016-01-16 LAB — CBC WITH DIFFERENTIAL/PLATELET
Basophils Absolute: 0.1 10*3/uL (ref 0.0–0.1)
Basophils Relative: 1 %
EOS ABS: 0.1 10*3/uL (ref 0.0–0.7)
EOS PCT: 1 %
HCT: 17.7 % — ABNORMAL LOW (ref 36.0–46.0)
Hemoglobin: 6 g/dL — CL (ref 12.0–15.0)
LYMPHS ABS: 2 10*3/uL (ref 0.7–4.0)
Lymphocytes Relative: 20 %
MCH: 33 pg (ref 26.0–34.0)
MCHC: 33.9 g/dL (ref 30.0–36.0)
MCV: 97.3 fL (ref 78.0–100.0)
MONO ABS: 0.4 10*3/uL (ref 0.1–1.0)
Monocytes Relative: 4 %
Neutro Abs: 7.3 10*3/uL (ref 1.7–7.7)
Neutrophils Relative %: 74 %
PLATELETS: 120 10*3/uL — AB (ref 150–400)
RBC: 1.82 MIL/uL — AB (ref 3.87–5.11)
RDW: 16.1 % — AB (ref 11.5–15.5)
WBC: 9.9 10*3/uL (ref 4.0–10.5)

## 2016-01-16 LAB — COMPREHENSIVE METABOLIC PANEL
ALT: 21 U/L (ref 14–54)
ANION GAP: 4 — AB (ref 5–15)
AST: 39 U/L (ref 15–41)
Albumin: 1.7 g/dL — ABNORMAL LOW (ref 3.5–5.0)
Alkaline Phosphatase: 69 U/L (ref 38–126)
BUN: 35 mg/dL — ABNORMAL HIGH (ref 6–20)
CHLORIDE: 116 mmol/L — AB (ref 101–111)
CO2: 19 mmol/L — AB (ref 22–32)
CREATININE: 2.55 mg/dL — AB (ref 0.44–1.00)
Calcium: 7.4 mg/dL — ABNORMAL LOW (ref 8.9–10.3)
GFR, EST AFRICAN AMERICAN: 23 mL/min — AB (ref >60)
GFR, EST NON AFRICAN AMERICAN: 20 mL/min — AB (ref >60)
Glucose, Bld: 107 mg/dL — ABNORMAL HIGH (ref 65–99)
SODIUM: 139 mmol/L (ref 135–145)
Total Bilirubin: 1.2 mg/dL (ref 0.3–1.2)
Total Protein: 3.7 g/dL — ABNORMAL LOW (ref 6.5–8.1)

## 2016-01-16 LAB — POCT I-STAT 4, (NA,K, GLUC, HGB,HCT)
GLUCOSE: 300 mg/dL — AB (ref 65–99)
HEMATOCRIT: 19 % — AB (ref 36.0–46.0)
HEMOGLOBIN: 6.5 g/dL — AB (ref 12.0–15.0)
POTASSIUM: 6.3 mmol/L — AB (ref 3.5–5.1)
SODIUM: 141 mmol/L (ref 135–145)

## 2016-01-16 LAB — BLOOD GAS, ARTERIAL
Acid-base deficit: 6.8 mmol/L — ABNORMAL HIGH (ref 0.0–2.0)
Bicarbonate: 17.2 mmol/L — ABNORMAL LOW (ref 20.0–28.0)
DRAWN BY: 418751
FIO2: 0.21
O2 Saturation: 98.4 %
PATIENT TEMPERATURE: 97.4
PH ART: 7.396 (ref 7.350–7.450)
pCO2 arterial: 28.4 mmHg — ABNORMAL LOW (ref 32.0–48.0)
pO2, Arterial: 101 mmHg (ref 83.0–108.0)

## 2016-01-16 LAB — POTASSIUM: Potassium: 7.1 mmol/L (ref 3.5–5.1)

## 2016-01-16 LAB — GLUCOSE, CAPILLARY
GLUCOSE-CAPILLARY: 110 mg/dL — AB (ref 65–99)
GLUCOSE-CAPILLARY: 153 mg/dL — AB (ref 65–99)
GLUCOSE-CAPILLARY: 123 mg/dL — AB (ref 65–99)

## 2016-01-16 LAB — MRSA PCR SCREENING: MRSA BY PCR: NEGATIVE

## 2016-01-16 LAB — LACTIC ACID, PLASMA
LACTIC ACID, VENOUS: 3.1 mmol/L — AB (ref 0.5–1.9)
LACTIC ACID, VENOUS: 2.6 mmol/L — AB (ref 0.5–1.9)
Lactic Acid, Venous: 3.4 mmol/L (ref 0.5–1.9)

## 2016-01-16 LAB — PROTIME-INR
INR: 1.74
INR: 2.02
PROTHROMBIN TIME: 23.1 s — AB (ref 11.4–15.2)
Prothrombin Time: 20.6 seconds — ABNORMAL HIGH (ref 11.4–15.2)

## 2016-01-16 LAB — AMMONIA: AMMONIA: 50 umol/L — AB (ref 9–35)

## 2016-01-16 LAB — TROPONIN I
TROPONIN I: 0.05 ng/mL — AB (ref <0.03)
Troponin I: 0.05 ng/mL (ref <0.03)
Troponin I: 0.04 ng/mL (ref <0.03)

## 2016-01-16 LAB — PREPARE RBC (CROSSMATCH)

## 2016-01-16 LAB — BLOOD PRODUCT ORDER (VERBAL) VERIFICATION

## 2016-01-16 LAB — ABO/RH: ABO/RH(D): AB POS

## 2016-01-16 LAB — APTT: aPTT: 35 seconds (ref 24–36)

## 2016-01-16 SURGERY — EGD (ESOPHAGOGASTRODUODENOSCOPY)
Anesthesia: Moderate Sedation

## 2016-01-16 MED ORDER — MORPHINE SULFATE (PF) 2 MG/ML IV SOLN
2.0000 mg | INTRAVENOUS | Status: DC | PRN
  Administered 2016-01-16 – 2016-01-18 (×2): 2 mg via INTRAVENOUS
  Filled 2016-01-16 (×2): qty 1

## 2016-01-16 MED ORDER — FUROSEMIDE 10 MG/ML IJ SOLN
60.0000 mg | Freq: Once | INTRAMUSCULAR | Status: AC
  Administered 2016-01-16: 60 mg via INTRAVENOUS
  Filled 2016-01-16: qty 6

## 2016-01-16 MED ORDER — SODIUM CHLORIDE 0.9 % IV SOLN: Freq: Once | INTRAVENOUS | Status: DC

## 2016-01-16 MED ORDER — SODIUM BICARBONATE 8.4 % IV SOLN: INTRAVENOUS | Status: DC

## 2016-01-16 MED ORDER — SODIUM CHLORIDE 0.9 % IV SOLN: INTRAVENOUS | Status: DC

## 2016-01-16 MED ORDER — INSULIN ASPART 100 UNIT/ML ~~LOC~~ SOLN
10.0000 [IU] | Freq: Once | SUBCUTANEOUS | Status: AC
  Administered 2016-01-16: 10 [IU] via INTRAVENOUS

## 2016-01-16 MED ORDER — NOREPINEPHRINE BITARTRATE 1 MG/ML IV SOLN
2.0000 ug/min | INTRAVENOUS | Status: DC
  Filled 2016-01-16: qty 4

## 2016-01-16 MED ORDER — SODIUM CHLORIDE 0.9 % IV SOLN
INTRAVENOUS | Status: DC
  Administered 2016-01-16: via INTRAVENOUS

## 2016-01-16 MED ORDER — DEXTROSE 50 % IV SOLN
50.0000 mL | Freq: Once | INTRAVENOUS | Status: AC
  Administered 2016-01-16: 50 mL via INTRAVENOUS
  Filled 2016-01-16: qty 50

## 2016-01-16 MED ORDER — FENTANYL CITRATE (PF) 100 MCG/2ML IJ SOLN
INTRAMUSCULAR | Status: DC | PRN
  Administered 2016-01-16: 25 ug via INTRAVENOUS

## 2016-01-16 MED ORDER — DIPHENHYDRAMINE HCL 50 MG/ML IJ SOLN
INTRAMUSCULAR | Status: DC | PRN
  Administered 2016-01-16: 25 mg via INTRAVENOUS

## 2016-01-16 MED ORDER — SODIUM CHLORIDE 0.9 % IV SOLN
Freq: Once | INTRAVENOUS | Status: AC
  Administered 2016-01-16: 01:00:00 via INTRAVENOUS

## 2016-01-16 MED ORDER — EPINEPHRINE HCL 0.1 MG/ML IJ SOSY
PREFILLED_SYRINGE | INTRAMUSCULAR | Status: AC
  Filled 2016-01-16: qty 10

## 2016-01-16 MED ORDER — SODIUM BICARBONATE 8.4 % IV SOLN
50.0000 meq | Freq: Once | INTRAVENOUS | Status: AC
  Administered 2016-01-16: 50 meq via INTRAVENOUS
  Filled 2016-01-16: qty 50

## 2016-01-16 MED ORDER — ALBUTEROL SULFATE (2.5 MG/3ML) 0.083% IN NEBU: 10.0000 mg | INHALATION_SOLUTION | Freq: Once | RESPIRATORY_TRACT | Status: DC

## 2016-01-16 MED ORDER — ONDANSETRON HCL 4 MG/2ML IJ SOLN
4.0000 mg | Freq: Four times a day (QID) | INTRAMUSCULAR | Status: DC | PRN
  Administered 2016-01-16: 4 mg via INTRAVENOUS
  Filled 2016-01-16: qty 2

## 2016-01-16 MED ORDER — CALCIUM CHLORIDE 10 % IV SOLN
INTRAVENOUS | Status: AC
  Administered 2016-01-16: 1000 mg
  Filled 2016-01-16: qty 10

## 2016-01-16 MED ORDER — VITAMIN K1 10 MG/ML IJ SOLN
10.0000 mg | Freq: Once | INTRAVENOUS | Status: AC
  Administered 2016-01-16: 10 mg via INTRAVENOUS
  Filled 2016-01-16: qty 1

## 2016-01-16 MED ORDER — DIPHENHYDRAMINE HCL 50 MG/ML IJ SOLN
INTRAMUSCULAR | Status: AC
Start: 2016-01-16 — End: 2016-01-16
  Filled 2016-01-16: qty 1

## 2016-01-16 MED ORDER — MIDAZOLAM HCL 10 MG/2ML IJ SOLN
INTRAMUSCULAR | Status: DC | PRN
Start: 2016-01-16 — End: 2016-01-16
  Administered 2016-01-16: 1 mg via INTRAVENOUS
  Administered 2016-01-16: 2 mg via INTRAVENOUS

## 2016-01-16 MED ORDER — BUTAMBEN-TETRACAINE-BENZOCAINE 2-2-14 % EX AERO
INHALATION_SPRAY | CUTANEOUS | Status: DC | PRN
Start: 2016-01-16 — End: 2016-01-16
  Administered 2016-01-16: 2 via TOPICAL

## 2016-01-16 MED ORDER — INSULIN ASPART 100 UNIT/ML IV SOLN: 10.0000 [IU] | Freq: Once | INTRAVENOUS | Status: DC

## 2016-01-16 MED ORDER — DEXTROSE 50 % IV SOLN
INTRAVENOUS | Status: AC
  Administered 2016-01-16: 50 mL
  Filled 2016-01-16: qty 50

## 2016-01-16 MED ORDER — DEXTROSE 10 % IV SOLN: Freq: Once | INTRAVENOUS | Status: DC

## 2016-01-16 MED ORDER — MIDAZOLAM HCL 5 MG/ML IJ SOLN
INTRAMUSCULAR | Status: AC
  Filled 2016-01-16: qty 2

## 2016-01-16 MED ORDER — SODIUM CHLORIDE 0.9 % IV SOLN
1.0000 g | Freq: Once | INTRAVENOUS | Status: DC
  Filled 2016-01-16: qty 10

## 2016-01-16 MED ORDER — SODIUM CHLORIDE 0.9 % IV SOLN
1.0000 g | Freq: Once | INTRAVENOUS | Status: AC
  Administered 2016-01-16: 1 g via INTRAVENOUS
  Filled 2016-01-16: qty 10

## 2016-01-16 MED ORDER — DEXTROSE 50 % IV SOLN: 1.0000 | Freq: Once | INTRAVENOUS | Status: DC

## 2016-01-16 MED ORDER — SODIUM POLYSTYRENE SULFONATE 15 GM/60ML PO SUSP
30.0000 g | Freq: Once | ORAL | Status: AC
  Administered 2016-01-16: 30 g via RECTAL
  Filled 2016-01-16: qty 120

## 2016-01-16 MED ORDER — SODIUM BICARBONATE 8.4 % IV SOLN
50.0000 meq | Freq: Once | INTRAVENOUS | Status: AC
  Administered 2016-01-16: 50 meq via INTRAVENOUS

## 2016-01-16 MED ORDER — SODIUM CHLORIDE 0.9 % IV SOLN
8.0000 mg/h | INTRAVENOUS | Status: DC
  Administered 2016-01-16 (×3): 8 mg/h via INTRAVENOUS
  Filled 2016-01-16 (×6): qty 80

## 2016-01-16 MED ORDER — SODIUM CHLORIDE 0.9 % IV SOLN: 250.0000 mL | INTRAVENOUS | Status: DC | PRN

## 2016-01-16 MED ORDER — SODIUM BICARBONATE 8.4 % IV SOLN
INTRAVENOUS | Status: AC
  Administered 2016-01-16: 50 meq
  Filled 2016-01-16: qty 100

## 2016-01-16 MED ORDER — FENTANYL CITRATE (PF) 100 MCG/2ML IJ SOLN
INTRAMUSCULAR | Status: AC
  Filled 2016-01-16: qty 2

## 2016-01-16 MED ORDER — SODIUM CHLORIDE 0.9 % IV SOLN
50.0000 ug/h | INTRAVENOUS | Status: DC
  Administered 2016-01-16 (×2): 50 ug/h via INTRAVENOUS
  Filled 2016-01-16 (×6): qty 1

## 2016-01-16 MED ORDER — DEXTROSE 5 % IV SOLN
Freq: Once | INTRAVENOUS | Status: AC
  Administered 2016-01-16: 11:00:00 via INTRAVENOUS
  Filled 2016-01-16: qty 100

## 2016-01-16 NOTE — Progress Notes (Signed)
CRITICAL VALUE ALERT  Critical value received:  Troponin  Date of notification:  01/16/16  Time of notification:  0116  Critical value read back: yes  Nurse who received alert:  Atilano Ina, RN  MD notified (1st page):  Eulas Post  Time of first page:  0117  Responding MD:  Eulas Post  Time MD responded:  [phone]

## 2016-01-16 NOTE — Progress Notes (Signed)
CRITICAL VALUE ALERT  Critical value received:  K  Date of notification:  01/16/16  Time of notification:  0110  Critical value read back: yes  Nurse who received alert:  Atilano Ina, RN  MD notified (1st page):  Eulas Post  Time of first page:  0112  Responding MD:  Eulas Post  Time MD responded:  [phone]

## 2016-01-16 NOTE — Progress Notes (Signed)
Critical EC4 results hand delivered to Dr Titus Mould. RN aware. RT will continue to monitor.

## 2016-01-16 NOTE — Progress Notes (Signed)
CRITICAL VALUE ALERT  Critical value received:  Lactic Acid 2.6  Date of notification:  01/16/16  Time of notification:  0115  Critical value read back: Yes  Nurse who received alert:  Atilano Ina, RN  MD notified (1st page):  Eulas Post  Time of first page:  0116  Responding MD:  Eulas Post  Time MD responded:  [phone]

## 2016-01-16 NOTE — Progress Notes (Signed)
I was notified of critical labs just prior to transfer of this patient to the ICU.  Troponin 0.05.  Potassium 7.5.  Lactic acid level 2.6.  Of note, RN also reports persistent hypotension (last systolic blood pressure in the 70's), despite volume resuscitation with 2L of NS since arrival to [facility] as well as active transfusion of first unit of PRBCs.  These values were discussed with the Critical Care PA Georgann Housekeeper, who will facilitate appropriate follow up in the ICU upon arrival.

## 2016-01-16 NOTE — Progress Notes (Signed)
CRITICAL VALUE ALERT  Critical value received:  K 7.1  Date of notification:  01/16/2016  Time of notification:  4628  Critical value read back:Yes.    Nurse who received alert:  Benjaman Pott  MD notified (1st page):  Titus Mould  Time of first page:  [phone]  MD notified (2nd page):  Time of second page:  Responding MD:  Titus Mould  Time MD responded:  [phone]

## 2016-01-16 NOTE — Procedures (Signed)
Central Venous Catheter Insertion Procedure Note Loretta [name] [id number] 1955-05-18  Procedure: Insertion of Central Venous Catheter Indications: Assessment of intravascular volume, Drug and/or fluid administration, Frequent blood sampling and loss access, bleeding, hyper k   Procedure Details Consent: Risks of procedure as well as the alternatives and risks of each were explained to the (patient/caregiver).  Consent for procedure obtained. Time Out: Verified patient identification, verified procedure, site/side was marked, verified correct patient position, special equipment/implants available, medications/allergies/relevent history reviewed, required imaging and test results available.  Performed  Maximum sterile technique was used including antiseptics, cap, gloves, gown, hand hygiene, mask and sheet. Skin prep: Chlorhexidine; local anesthetic administered A antimicrobial bonded/coated triple lumen catheter was placed in the right internal jugular vein using the Seldinger technique.  Evaluation Blood flow good Complications: No apparent complications Patient did tolerate procedure well. Chest X-ray ordered to verify placement.  CXR: pending.  Loretta [name] 01/16/2016, 1:29 PM  [place]  Loretta [name]. Titus Mould, MD, [place] Pgr: [facility]

## 2016-01-16 NOTE — Consult Note (Signed)
Reason for Consult: GI bleed Referring Physician: Hospital team  Loretta [name] is an 60 y.o. female.  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 Liver cirrhosis secondary to NASH     Past Surgical History:  Procedure Laterality Date  . CHOLECYSTECTOMY    . GASTRIC BYPASS      Family History  Problem Relation Age of Onset  . Throat cancer Father   . Lung cancer Brother     Social History:  reports that she has quit smoking. She has never used smokeless tobacco. She reports that she does not drink alcohol or use drugs.  Allergies:  Allergies  Allergen Reactions  . Codeine   . Cymbalta [Duloxetine Hcl]   . Levaquin [Levofloxacin In D5w]     Medications: I have reviewed the patient's current medications.  Results for orders placed or performed during the hospital encounter of 01/15/16 (from the past 48 hour(s))  Blood gas, arterial     Status: Abnormal   Collection Time: 01/15/16 12:09 AM  Result Value Ref Range   FIO2 0.21    pH, Arterial 7.396 7.350 - 7.450   pCO2 arterial 28.4 (L) 32.0 - 48.0 mmHg   pO2, Arterial 101 83.0 - 108.0 mmHg   Bicarbonate 17.2 (L) 20.0 - 28.0 mmol/L   Acid-base deficit 6.8 (H) 0.0 - 2.0 mmol/L   O2 Saturation 98.4 %   Patient temperature 97.4    Collection site RIGHT RADIAL    Drawn by [id number]    Sample type ARTERIAL DRAW    Allens test (pass/fail) PASS PASS  Glucose, capillary     Status: Abnormal   Collection Time: 01/15/16 11:26 PM  Result Value Ref Range   Glucose-Capillary 100 (H) 65 - 99 mg/dL  MRSA PCR Screening     Status: None   Collection Time: 01/15/16 11:37 PM  Result Value Ref Range   MRSA by PCR NEGATIVE NEGATIVE    Comment:        The GeneXpert MRSA Assay (FDA approved for NASAL specimens only), is one component of a comprehensive MRSA colonization surveillance program. It is not intended to diagnose MRSA infection nor to guide or monitor treatment for MRSA infections.   Type and screen [place]     Status: None (Preliminary result)   Collection Time: 01/16/16 12:02 AM  Result Value Ref Range   ABO/RH(D) AB POS    Antibody Screen NEG    Sample Expiration 01/18/2016    Unit Number [id number]    Blood Component Type RED CELLS,LR    Unit division 00    Status of Unit ISSUED    Unit tag comment VERBAL ORDERS PER DR CARTER    Transfusion Status OK TO TRANSFUSE    Crossmatch Result COMPATIBLE    Unit Number [id number]    Blood Component Type RED CELLS,LR    Unit division 00    Status of Unit ISSUED  Unit tag comment VERBAL ORDERS PER DR CARTER    Transfusion Status OK TO TRANSFUSE    Crossmatch Result COMPATIBLE    Unit Number [id number]    Blood Component Type RED CELLS,LR    Unit division 00    Status of Unit REL FROM [facility]    Transfusion Status OK TO TRANSFUSE    Crossmatch Result Compatible    Unit Number [id number]    Blood Component Type RED CELLS,LR    Unit division 00    Status of Unit ALLOCATED    Transfusion Status OK TO TRANSFUSE    Crossmatch Result Compatible    Unit Number [id number]    Blood Component Type RED CELLS,LR    Unit division 00    Status of Unit REL FROM [facility]    Transfusion Status OK TO TRANSFUSE    Crossmatch Result Compatible    Unit Number [id number]    Blood Component Type RED CELLS,LR    Unit division 00    Status of Unit ALLOCATED    Transfusion Status OK TO TRANSFUSE     Crossmatch Result Compatible    Unit Number [id number]    Blood Component Type RED CELLS,LR    Unit division 00    Status of Unit ISSUED    Transfusion Status OK TO TRANSFUSE    Crossmatch Result Compatible   CBC with Differential/Platelet     Status: Abnormal   Collection Time: 01/16/16 12:02 AM  Result Value Ref Range   WBC 9.9 4.0 - 10.5 K/uL   RBC 1.82 (L) 3.87 - 5.11 MIL/uL   Hemoglobin 6.0 (LL) 12.0 - 15.0 g/dL    Comment: CRITICAL RESULT CALLED TO, READ BACK BY AND VERIFIED WITH: ENNIS,M RN 01/16/2016 0029 JORDANS REPEATED TO VERIFY    HCT 17.7 (L) 36.0 - 46.0 %   MCV 97.3 78.0 - 100.0 fL   MCH 33.0 26.0 - 34.0 pg   MCHC 33.9 30.0 - 36.0 g/dL   RDW 16.1 (H) 11.5 - 15.5 %   Platelets 120 (L) 150 - 400 K/uL   Neutrophils Relative % 74 %   Neutro Abs 7.3 1.7 - 7.7 K/uL   Lymphocytes Relative 20 %   Lymphs Abs 2.0 0.7 - 4.0 K/uL   Monocytes Relative 4 %   Monocytes Absolute 0.4 0.1 - 1.0 K/uL   Eosinophils Relative 1 %   Eosinophils Absolute 0.1 0.0 - 0.7 K/uL   Basophils Relative 1 %   Basophils Absolute 0.1 0.0 - 0.1 K/uL  Comprehensive metabolic panel     Status: Abnormal   Collection Time: 01/16/16 12:02 AM  Result Value Ref Range   Sodium 139 135 - 145 mmol/L   Potassium >7.5 (HH) 3.5 - 5.1 mmol/L    Comment: NO VISIBLE HEMOLYSIS CRITICAL RESULT CALLED TO, READ BACK BY AND VERIFIED WITH: ABDUSSALAAM K,RN 01/16/16 0109 WAYK    Chloride 116 (H) 101 - 111 mmol/L   CO2 19 (L) 22 - 32 mmol/L   Glucose, Bld 107 (H) 65 - 99 mg/dL   BUN 35 (H) 6 - 20 mg/dL   Creatinine, Ser 2.55 (H) 0.44 - 1.00 mg/dL   Calcium 7.4 (L) 8.9 - 10.3 mg/dL   Total Protein 3.7 (L) 6.5 - 8.1 g/dL   Albumin 1.7 (L) 3.5 - 5.0 g/dL   AST 39 15 - 41 U/L   ALT 21 14 - 54 U/L   Alkaline Phosphatase 69 38 - 126 U/L   Total Bilirubin 1.2 0.3 -  1.2 mg/dL   GFR calc non Af Amer 20 (L) >60 mL/min   GFR calc Af Amer 23 (L) >60 mL/min    Comment: (NOTE) The eGFR has been calculated using the  CKD EPI equation. This calculation has not been validated in all clinical situations. eGFR's persistently <60 mL/min signify possible Chronic Kidney Disease.    Anion gap 4 (L) 5 - 15  Ammonia     Status: Abnormal   Collection Time: 01/16/16 12:02 AM  Result Value Ref Range   Ammonia 50 (H) 9 - 35 umol/L  Lactic acid, plasma     Status: Abnormal   Collection Time: 01/16/16 12:02 AM  Result Value Ref Range   Lactic Acid, Venous 2.6 (HH) 0.5 - 1.9 mmol/L    Comment: CRITICAL RESULT CALLED TO, READ BACK BY AND VERIFIED WITH: ABDUSSALAAM K,RN 01/16/16 0114 WAYK   Protime-INR     Status: Abnormal   Collection Time: 01/16/16 12:02 AM  Result Value Ref Range   Prothrombin Time 23.1 (H) 11.4 - 15.2 seconds   INR 2.02   Troponin I (q 6hr x 3)     Status: Abnormal   Collection Time: 01/16/16 12:02 AM  Result Value Ref Range   Troponin I 0.05 (HH) <0.03 ng/mL    Comment: CRITICAL RESULT CALLED TO, READ BACK BY AND VERIFIED WITH: ABDUSSALAAM K,RN 01/16/16 0117 WAYK   ABO/Rh     Status: None (Preliminary result)   Collection Time: 01/16/16 12:02 AM  Result Value Ref Range   ABO/RH(D) AB POS   Prepare RBC     Status: None   Collection Time: 01/16/16 12:35 AM  Result Value Ref Range   Order Confirmation ORDER PROCESSED BY BLOOD BANK   Prepare fresh frozen plasma     Status: None (Preliminary result)   Collection Time: 01/16/16 12:43 AM  Result Value Ref Range   Unit Number [id number]    Blood Component Type THAWED PLASMA    Unit division 00    Status of Unit ISSUED    Transfusion Status OK TO TRANSFUSE     No results found.  ROS negative except above Blood pressure (!) 107/32, pulse 69, temperature 97.8 F (36.6 C), temperature source Oral, resp. rate 16, height 5' 4" (1.626 m), weight 125.6 kg (276 lb 14.4 oz), SpO2 90 %. Physical Exam vital signs stable afebrile no acute distress exam please see preassessment evaluation hemoglobin decreased BUN and creatinine increased  increased INR  Assessment/Plan: GI bleeding in a patient with cirrhosis Plan: We discussed endoscopy and will proceed this morning with further workup and plans pending those findings and we discussed no aspirin or nonsteroidals long-term  Marypat Kimmet E 01/16/2016, 8:35 AM

## 2016-01-16 NOTE — Consult Note (Signed)
PULMONARY / CRITICAL CARE MEDICINE   Name: Loretta [name] MRN: [id number] DOB: 09/03/1955    ADMISSION DATE:  01/15/2016 CONSULTATION DATE:  01/16/2016  REFERRING MD:  Dr. Eulas Post [facility]  CHIEF COMPLAINT:  Hematochezia   HISTORY OF PRESENT ILLNESS:   60 year old female with PMH of HTN and NASH cirrhosis presented to [facility] ED 9/10 with complaints of hematochezia x 4 days. She had 3 episodes at home prior to arrival, and 2 additional episodes in [place] ED. She also suffered a syncopal event while attempting to use the bedside commode in the ED. She remained hypotensive despite IVF resuscitation and was transferred to [facility] for further evaluation. Upon arrival to [facility] SDU she was a little lethargic and encephalopathic with SBP in the 80s.   PAST MEDICAL HISTORY :  She  has no past medical history on file.  PAST SURGICAL HISTORY: She  has no past surgical history on file.  Allergies not on file  No current facility-administered medications on file prior to encounter.    No current outpatient prescriptions on file prior to encounter.    FAMILY HISTORY:  Her has no family status information on file.    SOCIAL HISTORY: She    REVIEW OF SYSTEMS:   Bolds are positive  Constitutional: weight loss, gain, night sweats, Fevers, chills, fatigue .  HEENT: headaches, Sore throat, sneezing, nasal congestion, post nasal drip, Difficulty swallowing, Tooth/dental problems, visual complaints visual changes, ear ache CV:  chest pain, radiates: ,Orthopnea, PND, swelling in lower extremities, dizziness, palpitations, syncope.  GI  heartburn, indigestion, abdominal pain now resolved, nausea, vomiting, diarrhea, change in bowel habits, loss of appetite, bloody stools.  Resp: cough, productive: , hemoptysis, dyspnea, chest pain, pleuritic.  Skin: rash or itching or icterus GU: dysuria, change in color of urine, urgency or frequency. flank pain, hematuria  MS: joint pain or  swelling. decreased range of motion  Psych: change in mood or affect. depression or anxiety.  Neuro: difficulty with speech, weakness, numbness, ataxia    SUBJECTIVE:    VITAL SIGNS: BP (!) 85/33 (BP Location: Right Arm)   Pulse 68   Temp 97.4 F (36.3 C) (Oral)   Resp 16   Ht '5\' 4"'$  (1.626 m)   Wt 119.8 kg (264 lb 1.8 oz)   SpO2 100%   BMI 45.33 kg/m   HEMODYNAMICS:    VENTILATOR SETTINGS:    INTAKE / OUTPUT: No intake/output data recorded.  PHYSICAL EXAMINATION: General:  Middle aged female in NAD Neuro:  Alert, oriented, non-focal HEENT:  [place]/AT, PERRL, no JVD Cardiovascular:  RRR, no MRG Lungs:  Clear Abdomen:  Soft, non-tender, non-distended Musculoskeletal:  No acute deformity Skin:  Pallor, jaundice, grossly intact  LABS:  BMET No results for input(s): NA, K, CL, CO2, BUN, CREATININE, GLUCOSE in the last 168 hours.  Electrolytes No results for input(s): CALCIUM, MG, PHOS in the last 168 hours.  CBC No results for input(s): WBC, HGB, HCT, PLT in the last 168 hours.  Coag's No results for input(s): APTT, INR in the last 168 hours.  Sepsis Markers No results for input(s): LATICACIDVEN, PROCALCITON, O2SATVEN in the last 168 hours.  ABG No results for input(s): PHART, PCO2ART, PO2ART in the last 168 hours.  Liver Enzymes No results for input(s): AST, ALT, ALKPHOS, BILITOT, ALBUMIN in the last 168 hours.  Cardiac Enzymes No results for input(s): TROPONINI, PROBNP in the last 168 hours.  Glucose  Recent Labs Lab 01/15/16 2326  GLUCAP 100*  Imaging No results found.   STUDIES:  CT abd > no obvious GIB source. 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   CULTURES:   ANTIBIOTICS:   SIGNIFICANT EVENTS: 7/10 admit for GIB  LINES/TUBES:   DISCUSSION: 60 year old female with PMH of cryptogenic cirrhosis now presenting with GI bleeding and hemodynamic instability. Will admit to ICU for blood transfusion and close  monitoring.   ASSESSMENT / PLAN:  PULMONARY A: No acute issues  P:   Supplemental O2 as needed to keep SpO2 > 92% Pulmonary hygiene   CARDIOVASCULAR A:  Shock, hemorrhagic in setting GIB P:  Tele monitoring Blood products for MAP goal 65 May need pressors, CVL Hold nadolol Follow LFTs Holding home lasix, spironolactone  RENAL A:   AKI Hyperkalemia (treated in ED with insulin, Ca)  P:   Gentle hydration, needs blood Follow BMP  GASTROINTESTINAL A:   Acute GI bleeding NASH Cirrhosis followed at [place] by EGD 2013  P:   NPO Protonix infusion (bolus at [place]) Octreotide infusion GI on board, will call if any additional bleeding. Planning to EGD in AM  HEMATOLOGIC A:   Acute blood loss anemia secondary to GI bleed (baseline hgb 12) Coagulopathy  P:  Continue transfusions per [facility] orders (3 units) Transfuse one unit FFP Follow CBC q6 hours  INFECTIOUS A:   No acute issues  P:    ENDOCRINE A:   No acute issues    P:   Follow glucose on BMP  NEUROLOGIC A:   Acute encephalopathy, mild  P:   RASS goal: 0  Holding lactulose rifaximin for now   FAMILY  - Updates: Patient and husband updated at bedside  - Inter-disciplinary family meet or Palliative Care meeting due by:  9/17   Georgann Housekeeper, AGACNP-BC [place] [facility] Pager [phone] or [phone]  01/16/2016 12:30 AM  Attending Note:  60 year old female with history of cryptogenic cirrhosis with varices who presents with UGI bleed, low Hg and high INR.  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  On exam, no ascites and lungs are clear.  I reviewed CXR myself, no acute disease noted.  Will d/c lactulose.  Transfuse 3 units pRBC and 1 unit FFP.  EGD called, NPO for planned EGD in AM.  BP was originally marginal and levophed ordered but did not have to be started after volume resuscitation.  PCCM will hold in the ICU for  today.  The patient is critically ill with multiple organ systems failure and requires high complexity decision making for assessment and support, frequent evaluation and titration of therapies, application of advanced monitoring technologies and extensive interpretation of multiple databases.   Critical Care Time devoted to patient care services described in this note is  45  Minutes. This time reflects time of care of this signee Dr Jennet Maduro. This critical care time does not reflect procedure time, or teaching time or supervisory time of PA/NP/Med student/Med Resident etc but could involve care discussion time.  Rush Farmer, M.D. [facility] Pulmonary/Critical Care Medicine. Pager: [phone]. After hours pager: [phone].

## 2016-01-16 NOTE — H&P (Signed)
History and Physical    Loretta [name] ASN:[id number] DOB: [birth year] DOA: 01/15/2016  PCP: She has [facility] and in [place].  Patient coming from: [facility]  Chief Complaint: Bloody stools for 3-4 days  HPI: Loretta [name] is a 60 y.o. woman with a history of cirrhosis secondary to NASH, followed at [facility] but she not actively being evaluated for transplant at this time, who presented to the ED at [facility] for evaluation of melena with some BRBPR for 3-4 days.  She has a history of esophageal varices, and home medications include nadolol.  She has had epigastric abdominal pain and nausea.  No significant hematemesis.  She has had increased weakness but no LOC.    ED Course: Evaluation at the outside ED concerning for hemoglobin of 8.9 (baseline 12), potassium of 6.2, Creatinine 2.3 (baseline around 1.5), ammonia level of 47 (though the patient is not acutely encephalopathic), and INR of 1.4.  Platelet count 115.  U/A was not consistent with infection.  At [facility], the patient was reportedly afebrile and hemodynamically stable with a systolic blood pressure in the 140s.  HR 68.  She was accepted for admission to the stepdown unit.  Prior to transfer here, she received IV insulin and dextrose and calcium gluconate for her hyperkalemia.  She received a one time dose of lactulose 20g (presumably for the elevated ammonia level).  She received protonix '80mg'$  IV and 1L NS bolus.  Blood transfusion was not ordered by the ED attending because she appeared hemodynamically stable.  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    Upon arrival to [facility], I was asked to provide emergent assessment of the patient for increased lethargy and relative hypotension (systolic blood pressures in the 80's, MAPs in the 50's).  The  patient's husband was at bedside and reported that she had a large melanic stool associated with vasovagal syncope prior to transfer.  Blood sugar upon arrival 100.  Systolic blood pressures refractory to an additional 2L of NS, empiric hydrocortisone '100mg'$  x 1, and first unit of emergent release PRBCs (STAT labs were in process when emergent blood was requested due to patient's hemodynamic instability).  PCCM was consulted.  Consult was also placed to unassigned GI attending (Dr. Watt Climes, [place]).  Octreotide and protonix infusions were ordered.  NPO status.  GI anticipating endoscopy in the AM.  Patient accepted to ICU due to her hemodynamic compromise.  Review of Systems: As per HPI otherwise 10 point review of systems negative.    Past Medical History:  Diagnosis Date  . Liver cirrhosis secondary to NASH   Restless leg syndrome Depression  Past Surgical History:  Procedure Laterality Date  . CHOLECYSTECTOMY    . GASTRIC BYPASS       reports that she has quit smoking. She has never used smokeless tobacco. She reports that she does not drink alcohol or use drugs.  She is married.  She has three children.  She is on disability.  Allergies  Allergen Reactions  . Codeine   . Cymbalta [Duloxetine Hcl]   . Levaquin [Levofloxacin In D5w]     Family History  Problem Relation Age of Onset  . Throat cancer Father   . Lung cancer Brother      Prior to Admission medications   Not on File  Consult placed to  pharmacy for admission medication reconciliation.  Home medication list reviewed, and includes Rifaximin, lactulose, nadolol, lasix, spironolactone, mirapex, and effexor among others.  Physical Exam: Vitals:   01/16/16 0115 01/16/16 0130 01/16/16 0145 01/16/16 0200  BP: (!) 76/37 (!) 73/22 (!) 106/47 (!) 96/42  Pulse: 69 68 63 71  Resp: '14 20 13 16  '$ Temp:  97.5 F (36.4 C) 97.4 F (36.3 C)   TempSrc:  Oral Oral   SpO2: 98% 99% 100% 98%  Weight:      Height:           Constitutional: NAD, calm, lethargic but arousable, alert and oriented x 4, making jokes intermittently Vitals:   01/16/16 0115 01/16/16 0130 01/16/16 0145 01/16/16 0200  BP: (!) 76/37 (!) 73/22 (!) 106/47 (!) 96/42  Pulse: 69 68 63 71  Resp: '14 20 13 16  '$ Temp:  97.5 F (36.4 C) 97.4 F (36.3 C)   TempSrc:  Oral Oral   SpO2: 98% 99% 100% 98%  Weight:      Height:       Eyes: PERRL, lids and conjunctivae PALE ENMT: Mucous membranes are DRY. Posterior pharynx clear of any exudate or lesions. Normal dentition.  Neck: normal appearance, supple Respiratory: clear to auscultation bilaterally, no wheezing, no crackles. Normal respiratory effort. No accessory muscle use.  Cardiovascular: Normal rate, regular rhythm.  No extremity edema. 2+ pedal pulses. GI: abdomen is soft and compressible.  No distention.  Diffuse tenderness.  Mild guarding.  No rebound.  Bowel sounds are present. Musculoskeletal:  No joint deformity in upper and lower extremities. Good ROM, no contractures. Normal muscle tone.  Skin: PALE and COOL Neurologic: No focal deficits. Psychiatric: Normal judgment and insight. Alert and oriented x 3. Flat affect.    Labs on Admission: I have personally reviewed following labs and imaging studies  CBC:  Recent Labs Lab 01/16/16 0002  WBC 9.9  NEUTROABS 7.3  HGB 6.0*  HCT 17.7*  MCV 97.3  PLT 161*   Basic Metabolic Panel:  Recent Labs Lab 01/16/16 0002  NA 139  K >7.5*  CL 116*  CO2 19*  GLUCOSE 107*  BUN 35*  CREATININE 2.55*  CALCIUM 7.4*   GFR: Estimated Creatinine Clearance: 30.6 mL/min (by C-G formula based on SCr of 2.55 mg/dL). Liver Function Tests:  Recent Labs Lab 01/16/16 0002  AST 39  ALT 21  ALKPHOS 69  BILITOT 1.2  PROT 3.7*  ALBUMIN 1.7*    Recent Labs Lab 01/16/16 0002  AMMONIA 50*   Coagulation Profile:  Recent Labs Lab 01/16/16 0002  INR 2.02   Cardiac Enzymes:  Recent Labs Lab 01/16/16 0002  TROPONINI  0.05*   CBG:  Recent Labs Lab 01/15/16 2326  GLUCAP 100*   Sepsis Labs:  Lactic acid level 2.6  Radiological Exams on Admission: No results found.  EKG: Independently reviewed. NSR.  No acute ST segment changes  Assessment/Plan Active Problems:   GI bleed      Active GI bleed causing acute blood loss anemia and hypovolemic shock, presumed secondary to esophageal varices secondary to cirrhosis --PCCM assistance greatly appreciated; the patient will transfer to the ICU in case she needs pressor support --Dr. Watt Climes accepted GI consult for [facility] --Will transfuse three units of PRBCs STAT --HOLD diuretics and nadolol for now due to relative hypotension --Octreotide and protonix infusions --Empiric hydrocortisone '100mg'$  IV given x one for shock, while awaiting PRBCs --Anticipate EGD in the AM, NPO status now  Cirrhosis, restless leg  syndrome, depression --Home medications on hold until hemodynamics improved, NPO  Severe protein calorie malnutrition --She will need nutrition consult when cleared for PO intake  AKI with hyperkalemia, metabolic acidosis --Avoid nephrotoxic medications --Suspect she has some degree of ATN secondary to hypotension --Supportive care, IV fluid resuscitation, low threshold for nephrology consult if creatinine continues to rise --Acute hyperkalemia management per PCCM   DVT prophylaxis: SCDs Code Status: FULL Family Communication: Patient's husband present at bedside at time of admission. Disposition Plan: To be determined. Consults called: PCCM, GI Admission status: Inpatient, ICU   TIME SPENT: 95 minutes   Eber Jones MD [facility] Pager [phone]  If 7PM-7AM, please contact night-coverage [email or web address] Password [email or web address]  01/16/2016, 2:17 AM

## 2016-01-16 NOTE — Consult Note (Signed)
PULMONARY / CRITICAL CARE MEDICINE   Name: Loretta [name] MRN: [id number] DOB: 1955-10-19    ADMISSION DATE:  01/15/2016 CONSULTATION DATE:  01/16/2016  REFERRING MD:  Dr. Eulas Post [facility]  CHIEF COMPLAINT:  Hematochezia   HISTORY OF PRESENT ILLNESS:   60 year old female with PMH of HTN and NASH cirrhosis presented to [facility] ED 9/10 with complaints of hematochezia x 4 days. She had 3 episodes at home prior to arrival, and 2 additional episodes in [place] ED. She also suffered a syncopal event while attempting to use the bedside commode in the ED. She remained hypotensive despite IVF resuscitation and was transferred to [facility] for further evaluation. Upon arrival to [facility] SDU she was a little lethargic and encephalopathic with SBP in the 80s.   SUBJECTIVE:  EGD done, anastomotic site bleeding site Just back hyperK   VITAL SIGNS: BP (!) 103/35   Pulse 74   Temp 97.8 F (36.6 C) (Oral)   Resp 15   Ht '5\' 4"'$  (1.626 m)   Wt 125.6 kg (276 lb 14.4 oz)   SpO2 99%   BMI 47.53 kg/m   HEMODYNAMICS:    VENTILATOR SETTINGS:    INTAKE / OUTPUT: I/O last 3 completed shifts: In: 1061.3 [I.V.:293.3; Blood:768] Out: -   PHYSICAL EXAMINATION: General:  Middle aged female in NAD Neuro:  Alert, O x 3, lethargy mild post sedation for scope HEENT:  [place]/AT, PERRL, no JVD Cardiovascular:  RRR, no MRG Lungs:  Clear Abdomen:  Soft, non-tender, non-distended, no r/g Musculoskeletal:  No acute deformity Skin:  Pallor, jaundice, grossly intact  LABS:  BMET  Recent Labs Lab 01/16/16 0002 01/16/16 0820  NA 139  --   K >7.5* 7.1*  CL 116*  --   CO2 19*  --   BUN 35*  --   CREATININE 2.55*  --   GLUCOSE 107*  --     Electrolytes  Recent Labs Lab 01/16/16 0002  CALCIUM 7.4*    CBC  Recent Labs Lab 01/16/16 0002  WBC 9.9  HGB 6.0*  HCT 17.7*  PLT 120*    Coag's  Recent Labs Lab 01/16/16 0002  INR 2.02    Sepsis Markers  Recent Labs Lab  01/16/16 0002 01/16/16 0820  LATICACIDVEN 2.6* 3.4*    ABG  Recent Labs Lab 01/15/16 0009  PHART 7.396  PCO2ART 28.4*  PO2ART 101    Liver Enzymes  Recent Labs Lab 01/16/16 0002  AST 39  ALT 21  ALKPHOS 69  BILITOT 1.2  ALBUMIN 1.7*    Cardiac Enzymes  Recent Labs Lab 01/16/16 0002 01/16/16 0820  TROPONINI 0.05* 0.04*    Glucose  Recent Labs Lab 01/15/16 2326  GLUCAP 100*    Imaging No results found.   STUDIES:  CT abd > no obvious GIB source. 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   CULTURES:   ANTIBIOTICS:   SIGNIFICANT EVENTS: 7/10 admit for GIB  LINES/TUBES:   DISCUSSION: 60 year old female with PMH of cryptogenic cirrhosis now presenting with GI bleeding and hemodynamic instability. Will admit to ICU for blood transfusion and close monitoring.   ASSESSMENT / PLAN:  PULMONARY A: R/o acidosios  P:   Supplemental O2 as needed to keep SpO2 > 92% Pulmonary hygiene  ABg stat with K noted  CARDIOVASCULAR A:  Shock, hemorrhagic in setting GIB P:  Tele monitoring Cbc frequent Low threshold for line  RENAL A:   AKI Hyperkalemia (treated in ED  with insulin, Ca) Now to 7.1  P:   bmet q4h Stat calcium, bicarb then drip, insulin Lasix  abg repeat   GASTROINTESTINAL A:   Acute GI bleeding NASH Cirrhosis followed at [place] by EGD 2013 anastmotic bleeding source P:   NPO Protonix infusion (bolus at [place]) Octreotide infusion- consider dc npo  HEMATOLOGIC A:   Acute blood loss anemia secondary to GI bleed (baseline hgb 12) Coagulopathy  P:  Cbc stat on abg and q4h Repeat coags ensure vit k   INFECTIOUS A:   No acute issues  P:   No variseal bleeding source, no need ABX  ENDOCRINE A:   At risk hypoglycemia    P:   Follow glucose on BMP Glu now   NEUROLOGIC A:   Acute encephalopathy, mild  P:   RASS goal: 0  Holding lactulose rifaximin until able to give   FAMILY   - Updates: I updated pt  - Inter-disciplinary family meet or Palliative Care meeting due by:  9/17   Ccm time 45 min   Lavon Paganini. Titus Mould, MD, [place] Pgr: [place] [facility]

## 2016-01-16 NOTE — Op Note (Signed)
[facility] Patient Name: Loretta [name] Procedure Date : 01/16/2016 MRN: [id number] Attending MD: Clarene Essex , MD Date of Birth: 25-Aug-1955 CSN: [id number] Age: 60 Admit Type: Inpatient Procedure:                Upper GI endoscopy Indications:              Acute post hemorrhagic anemia, Melena Providers:                Clarene Essex, MD, Dortha Schwalbe RN, RN, Ralene Bathe, Technician Referring MD:              Medicines:                Fentanyl 25 micrograms IV, Midazolam 3 mg IV,                            Diphenhydramine 25 mg IV, Cetacaine spray Complications:            No immediate complications. Estimated Blood Loss:     Estimated blood loss: none. Procedure:                Pre-Anesthesia Assessment:                           - Prior to the procedure, a History and Physical                            was performed, and patient medications and                            allergies were reviewed. The patient's tolerance of                            previous anesthesia was also reviewed. The risks                            and benefits of the procedure and the sedation                            options and risks were discussed with the patient.                            All questions were answered, and informed consent                            was obtained. Prior Anticoagulants: The patient has                            taken ibuprofen, last dose was 2 days prior to                            procedure. ASA Grade Assessment: III - A patient  with severe systemic disease. After reviewing the                            risks and benefits, the patient was deemed in                            satisfactory condition to undergo the procedure.                           After obtaining informed consent, the endoscope was                            passed under direct vision. Throughout the                             procedure, the patient's blood pressure, pulse, and                            oxygen saturations were monitored continuously. The                            EG-[id number] ([id number]) scope was introduced through the                            mouth, and advanced to the second part of duodenum.                            The upper GI endoscopy was accomplished without                            difficulty. The patient tolerated the procedure                            well. Scope In: Scope Out: Findings:      The larynx was normal.      The examined esophagus was normal.      One non-bleeding superficial gastric ulcer with no stigmata of bleeding       was found in the gastric fundus.      Evidence of a gastric bypass was found. A gastric pouch was found. The       gastrojejunal anastomosis was characterized by ulceration. This was       traversed. The pouch-to-jejunum limb measured 60 cm from incisors and       was characterized by healthy appearing mucosa. The jejunojejunal       anastomosis was characterized by healthy appearing mucosa. The       duodenum-to-jejunum limb was examined 10 cm from anastomosis and was       characterized by healthy appearing mucosa.      The exam was otherwise without abnormality. Impression:               - Normal larynx.                           - Normal esophagus.                           -  Non-bleeding gastric ulcer with no stigmata of                            bleeding.                           - Gastric bypass. Gastrojejunal anastomosis                            characterized by ulceration.                           - The examination was otherwise normal.                           - No specimens collected. Moderate Sedation:      Moderate (conscious) sedation was administered by the endoscopy nurse       and supervised by the endoscopist. The following parameters were       monitored: oxygen saturation, heart rate, blood pressure, respiratory        rate, EKG, adequacy of pulmonary ventilation, and response to care. Recommendation:           - Patient has a contact number available for                            emergencies. The signs and symptoms of potential                            delayed complications were discussed with the                            patient. Return to normal activities tomorrow.                            Written discharge instructions were provided to the                            patient.                           - Clear liquid diet today. No aspirin or                            nonsteroidals long-termand no more than 4 Tylenol                            over-the-counter a day and will change to oral pump                            inhibitor and Carafate                           - Continue present medications.                           - Return to liver clinic in 1 week.                           -  Telephone GI clinic if symptomatic PRN. Procedure Code(s):        --- Professional ---                           [phone], Esophagogastroduodenoscopy, flexible,                            transoral; diagnostic, including collection of                            specimen(s) by brushing or washing, when performed                            (separate procedure) Diagnosis Code(s):        --- Professional ---                           K25.9, Gastric ulcer, unspecified as acute or                            chronic, without hemorrhage or perforation                           K28.9, Gastrojejunal ulcer, unspecified as acute or                            chronic, without hemorrhage or perforation                           D62, Acute posthemorrhagic anemia                           K92.1, Melena (includes Hematochezia) CPT copyright 2016 American Medical Association. All rights reserved. The codes documented in this report are preliminary and upon coder review may  be revised to meet current compliance  requirements. Clarene Essex, MD 01/16/2016 10:02:18 AM This report has been signed electronically. Number of Addenda: 0

## 2016-01-17 ENCOUNTER — Encounter (HOSPITAL_COMMUNITY): Payer: Self-pay | Admitting: Gastroenterology

## 2016-01-17 LAB — BASIC METABOLIC PANEL
ANION GAP: 5 (ref 5–15)
Anion gap: 6 (ref 5–15)
Anion gap: 7 (ref 5–15)
BUN: 38 mg/dL — ABNORMAL HIGH (ref 6–20)
BUN: 38 mg/dL — AB (ref 6–20)
BUN: 37 mg/dL — ABNORMAL HIGH (ref 6–20)
CALCIUM: 7.8 mg/dL — AB (ref 8.9–10.3)
CALCIUM: 7.8 mg/dL — AB (ref 8.9–10.3)
CHLORIDE: 108 mmol/L (ref 101–111)
CHLORIDE: 110 mmol/L (ref 101–111)
CO2: 24 mmol/L (ref 22–32)
CO2: 25 mmol/L (ref 22–32)
CO2: 24 mmol/L (ref 22–32)
CREATININE: 2.72 mg/dL — AB (ref 0.44–1.00)
CREATININE: 2.62 mg/dL — AB (ref 0.44–1.00)
Calcium: 8 mg/dL — ABNORMAL LOW (ref 8.9–10.3)
Chloride: 107 mmol/L (ref 101–111)
Creatinine, Ser: 2.77 mg/dL — ABNORMAL HIGH (ref 0.44–1.00)
GFR calc Af Amer: 21 mL/min — ABNORMAL LOW (ref >60)
GFR calc Af Amer: 21 mL/min — ABNORMAL LOW (ref >60)
GFR calc non Af Amer: 18 mL/min — ABNORMAL LOW (ref >60)
GFR calc non Af Amer: 18 mL/min — ABNORMAL LOW (ref >60)
GFR calc non Af Amer: 19 mL/min — ABNORMAL LOW (ref >60)
GFR, EST AFRICAN AMERICAN: 22 mL/min — AB (ref >60)
GLUCOSE: 159 mg/dL — AB (ref 65–99)
Glucose, Bld: 105 mg/dL — ABNORMAL HIGH (ref 65–99)
Glucose, Bld: 94 mg/dL (ref 65–99)
POTASSIUM: 4.5 mmol/L (ref 3.5–5.1)
Potassium: 4.3 mmol/L (ref 3.5–5.1)
Potassium: 4.9 mmol/L (ref 3.5–5.1)
SODIUM: 140 mmol/L (ref 135–145)
SODIUM: 138 mmol/L (ref 135–145)
Sodium: 138 mmol/L (ref 135–145)

## 2016-01-17 LAB — PREPARE FRESH FROZEN PLASMA: Unit division: 0

## 2016-01-17 LAB — TYPE AND SCREEN
ABO/RH(D): AB POS
ANTIBODY SCREEN: NEGATIVE
UNIT DIVISION: 0
UNIT DIVISION: 0
UNIT DIVISION: 0
Unit division: 0
Unit division: 0
Unit division: 0
Unit division: 0

## 2016-01-17 LAB — CBC
HEMATOCRIT: 27.4 % — AB (ref 36.0–46.0)
HEMOGLOBIN: 9.4 g/dL — AB (ref 12.0–15.0)
MCH: 31 pg (ref 26.0–34.0)
MCHC: 34.3 g/dL (ref 30.0–36.0)
MCV: 90.4 fL (ref 78.0–100.0)
Platelets: 66 10*3/uL — ABNORMAL LOW (ref 150–400)
RBC: 3.03 MIL/uL — ABNORMAL LOW (ref 3.87–5.11)
RDW: 16.3 % — AB (ref 11.5–15.5)
WBC: 12.4 10*3/uL — ABNORMAL HIGH (ref 4.0–10.5)

## 2016-01-17 LAB — LACTIC ACID, PLASMA: Lactic Acid, Venous: 2.4 mmol/L (ref 0.5–1.9)

## 2016-01-17 LAB — MAGNESIUM: Magnesium: 1.4 mg/dL — ABNORMAL LOW (ref 1.7–2.4)

## 2016-01-17 LAB — PHOSPHORUS: PHOSPHORUS: 4.1 mg/dL (ref 2.5–4.6)

## 2016-01-17 MED ORDER — WITCH HAZEL-GLYCERIN EX PADS
MEDICATED_PAD | CUTANEOUS | Status: DC | PRN
  Filled 2016-01-17: qty 100

## 2016-01-17 MED ORDER — PANTOPRAZOLE SODIUM 40 MG PO TBEC
40.0000 mg | DELAYED_RELEASE_TABLET | Freq: Two times a day (BID) | ORAL | Status: DC
  Administered 2016-01-17 – 2016-01-21 (×10): 40 mg via ORAL
  Filled 2016-01-17 (×11): qty 1

## 2016-01-17 MED ORDER — RIFAXIMIN 550 MG PO TABS
550.0000 mg | ORAL_TABLET | Freq: Two times a day (BID) | ORAL | Status: DC
  Administered 2016-01-17 – 2016-01-21 (×9): 550 mg via ORAL
  Filled 2016-01-17 (×10): qty 1

## 2016-01-17 MED ORDER — FUROSEMIDE 10 MG/ML IJ SOLN
60.0000 mg | Freq: Two times a day (BID) | INTRAMUSCULAR | Status: DC
  Administered 2016-01-17 – 2016-01-18 (×3): 60 mg via INTRAVENOUS
  Filled 2016-01-17 (×3): qty 6

## 2016-01-17 MED ORDER — SUCRALFATE 1 GM/10ML PO SUSP
1.0000 g | Freq: Three times a day (TID) | ORAL | Status: DC
  Administered 2016-01-17 – 2016-01-21 (×19): 1 g via ORAL
  Filled 2016-01-17 (×21): qty 10

## 2016-01-17 MED ORDER — SODIUM CHLORIDE 0.9 % IV SOLN
50.0000 ug/h | INTRAVENOUS | Status: DC
  Administered 2016-01-17: 50 ug/h via INTRAVENOUS
  Filled 2016-01-17 (×3): qty 1

## 2016-01-17 MED ORDER — MAGNESIUM SULFATE 4 GM/100ML IV SOLN
4.0000 g | Freq: Once | INTRAVENOUS | Status: AC
  Administered 2016-01-17: 4 g via INTRAVENOUS
  Filled 2016-01-17: qty 100

## 2016-01-17 MED ORDER — LACTULOSE 10 GM/15ML PO SOLN
20.0000 g | Freq: Three times a day (TID) | ORAL | Status: DC
  Administered 2016-01-17 – 2016-01-20 (×10): 20 g via ORAL
  Filled 2016-01-17 (×11): qty 30

## 2016-01-17 NOTE — Progress Notes (Signed)
Loretta [name] 10:27 AM  Subjective: Patient doing well without signs of further bleeding and we discussed no aspirin and nonsteroidals with her and her husband and she has no other complaints  Objective: Vital signs stable afebrile no acute distress abdomen is soft nontender hemoglobin stable BUN and creatinine still increased  Assessment: Anastomotic ulcer in patient with cirrhosis I am still concerned about her BUN and creatinine  Plan: Okay to advance to soft diet .I do not believe she needs a colonoscopy at this time. happy to see back when necessary otherwise close follow-up with [name] and consider renal consult if needed  [facility] E  Pager [phone] After 5PM or if no answer call [phone]

## 2016-01-17 NOTE — Consult Note (Addendum)
PULMONARY / CRITICAL CARE MEDICINE   Name: Loretta [name] MRN: [id number] DOB: [birth year]    ADMISSION DATE:  01/15/2016 CONSULTATION DATE:  01/16/2016  REFERRING MD:  Dr. Eulas Post [facility]  CHIEF COMPLAINT:  Hematochezia   HISTORY OF PRESENT ILLNESS:   60 year old female with PMH of HTN and NASH cirrhosis presented to [facility] ED 9/10 with complaints of hematochezia x 4 days. She had 3 episodes at home prior to arrival, and 2 additional episodes in [place] ED. She also suffered a syncopal event while attempting to use the bedside commode in the ED. She remained hypotensive despite IVF resuscitation and was transferred to [facility] for further evaluation. Upon arrival to [facility] SDU she was a little lethargic and encephalopathic with SBP in the 80s.   SUBJECTIVE:  K resolved  VITAL SIGNS: BP (!) 117/53   Pulse 75   Temp 98.3 F (36.8 C) (Oral)   Resp (!) 9   Ht '5\' 4"'$  (1.626 m)   Wt 129.9 kg (286 lb 6 oz)   SpO2 100%   BMI 49.16 kg/m   HEMODYNAMICS: CVP:  [6 mmHg-8 mmHg] 6 mmHg  VENTILATOR SETTINGS:    INTAKE / OUTPUT: I/O last 3 completed shifts: In: 4588.3 [P.O.:480; I.V.:2292.3; Blood:1816] Out: 1875 [[id number]]  PHYSICAL EXAMINATION: General:  Middle aged female in NAD Neuro:  Alert, O x 3, in good spiritis HEENT:  Line rt ij wnl Cardiovascular:  RRR, no MRG Lungs:  Clear Abdomen:  Soft, non-tender, non-distended, no r/g Musculoskeletal:  No acute deformity Skin:  Pallor, jaundice, grossly intact  LABS:  BMET  Recent Labs Lab 01/16/16 1859 01/16/16 2347 01/17/16 0451  NA 141 140 138  K 5.0 4.3 4.9  CL 111 110 107  CO2 '23 24 24  '$ BUN 39* 38* 38*  CREATININE 2.76* 2.72* 2.62*  GLUCOSE 187* 105* 94    Electrolytes  Recent Labs Lab 01/16/16 1859 01/16/16 2347 01/17/16 0451  CALCIUM 8.4* 8.0* 7.8*  MG  --   --  1.4*  PHOS  --   --  4.1    CBC  Recent Labs Lab 01/16/16 1357 01/16/16 1859 01/16/16 2347  WBC 14.6* 13.4* 14.9*   HGB 10.3* 10.7* 10.3*  HCT 30.3* 31.5* 29.7*  PLT 68* 61* 68*    Coag's  Recent Labs Lab 01/16/16 0002 01/16/16 1028  APTT  --  35  INR 2.02 1.74    Sepsis Markers  Recent Labs Lab 01/16/16 0820 01/16/16 1900 01/16/16 2352  LATICACIDVEN 3.4* 3.1* 2.4*    ABG  Recent Labs Lab 01/15/16 0009 01/16/16 1007  PHART 7.396 7.479*  PCO2ART 28.4* 31.2*  PO2ART 101 210.0*    Liver Enzymes  Recent Labs Lab 01/16/16 0002  AST 39  ALT 21  ALKPHOS 69  BILITOT 1.2  ALBUMIN 1.7*    Cardiac Enzymes  Recent Labs Lab 01/16/16 0002 01/16/16 0820 01/16/16 1530  TROPONINI 0.05* 0.04* 0.05*    Glucose  Recent Labs Lab 01/15/16 2326 01/16/16 0950 01/16/16 1206 01/16/16 1429  GLUCAP 100* 110* 153* 123*    Imaging Dg Chest Port 1 View  Result Date: 01/16/2016 CLINICAL DATA:  60 year old female with central line placement. Initial encounter. EXAM: PORTABLE CHEST 1 VIEW COMPARISON:  None. FINDINGS: Right internal jugular catheter tip proximal superior vena cava level. No infiltrate, congestive heart failure or pneumothorax. Central pulmonary vascular prominence may be related to the AP medication. Heart size within normal limits. IMPRESSION: Right internal jugular catheter tip proximal superior  vena cava level. Electronically Signed   By: Genia Del M.D.   On: 01/16/2016 14:56   EGD 9/11>>>Non-bleeding gastric ulcer with no stigmata of                            bleeding.                           - Gastric bypass. Gastrojejunal anastomosis                            characterized by ulceration.  STUDIES:  CT abd > no obvious GIB source. 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   CULTURES:   ANTIBIOTICS:   SIGNIFICANT EVENTS: 7/10 admit for GIB, hyperK  LINES/TUBES: Rt ij 9/11>>>  DISCUSSION: 60 year old female with PMH of cryptogenic cirrhosis now presenting with GI bleeding and hemodynamic instability. Will admit to ICU for blood  transfusion and close monitoring.   ASSESSMENT / PLAN:  PULMONARY A: At risk edema  P:   Supplemental O2 to RA Even balance to neg IS  CARDIOVASCULAR A:  Shock, hemorrhagic in setting GIB - resolved  P:  Tele monitoring Cbc in am   RENAL A:   AKI Hyperkalemia resolved Hypo mag  P:   bmet q12h Lasix consider to even balance, was pos 2 liters Mag supp   GASTROINTESTINAL A:   Acute GI bleeding - Non-bleeding gastric ulcer with no stigmata of                            bleeding.                           - Gastric bypass. Gastrojejunal anastomosis                            characterized by ulceration. NASH Cirrhosis followed at [place] by EGD 2013 P:   NPO Protonix per GI carafat Octreotide dc, no role Diet per GI Cbc in am   HEMATOLOGIC A:   Acute blood loss anemia secondary to GI bleed (baseline hgb 12) Coagulopathy  P:  Cbc am ensure vit k  Then dc further  INFECTIOUS A:   No acute issues  P:   No variseal bleeding source, no need ABX  ENDOCRINE A:   At risk hypoglycemia    P:   Follow glucose on BMP Glu now   NEUROLOGIC A:   Acute encephalopathy, mild Metabolic Encephalopathy secondary to Hypovolemic/Hemorrhagic Shock    P:   RASS goal: 0  Restart lactulose rifaximin    FAMILY  - Updates: I updated pt and husband  - Inter-disciplinary family meet or Palliative Care meeting due by:  9/17  To [facility] line if able   Lavon Paganini. Titus Mould, MD, [place] Pgr: [place] [facility]

## 2016-01-18 DIAGNOSIS — N170 Acute kidney failure with tubular necrosis: Secondary | ICD-10-CM

## 2016-01-18 LAB — BASIC METABOLIC PANEL
Anion gap: 6 (ref 5–15)
BUN: 36 mg/dL — AB (ref 6–20)
CO2: 26 mmol/L (ref 22–32)
CREATININE: 2.75 mg/dL — AB (ref 0.44–1.00)
Calcium: 7.9 mg/dL — ABNORMAL LOW (ref 8.9–10.3)
Chloride: 105 mmol/L (ref 101–111)
GFR, EST AFRICAN AMERICAN: 21 mL/min — AB (ref >60)
GFR, EST NON AFRICAN AMERICAN: 18 mL/min — AB (ref >60)
Glucose, Bld: 125 mg/dL — ABNORMAL HIGH (ref 65–99)
POTASSIUM: 4.2 mmol/L (ref 3.5–5.1)
SODIUM: 137 mmol/L (ref 135–145)

## 2016-01-18 MED ORDER — LACTULOSE 20 G PO PACK: 20.0000 g | PACK | Freq: Two times a day (BID) | ORAL | Status: DC

## 2016-01-18 MED ORDER — OXYCODONE HCL 5 MG PO TABS
5.0000 mg | ORAL_TABLET | ORAL | Status: DC | PRN
  Administered 2016-01-18 – 2016-01-21 (×3): 10 mg via ORAL
  Filled 2016-01-18 (×5): qty 2

## 2016-01-18 MED ORDER — FOLIC ACID 1 MG PO TABS
1.0000 mg | ORAL_TABLET | Freq: Every day | ORAL | Status: DC
  Administered 2016-01-18 – 2016-01-21 (×4): 1 mg via ORAL
  Filled 2016-01-18 (×5): qty 1

## 2016-01-18 MED ORDER — ADULT MULTIVITAMIN W/MINERALS CH
ORAL_TABLET | Freq: Every day | ORAL | Status: DC
  Administered 2016-01-18 – 2016-01-21 (×4): 1 via ORAL
  Filled 2016-01-18 (×5): qty 1

## 2016-01-18 MED ORDER — TRAMADOL HCL 50 MG PO TABS: 50.0000 mg | ORAL_TABLET | Freq: Four times a day (QID) | ORAL | Status: DC | PRN

## 2016-01-18 MED ORDER — NADOLOL 20 MG PO TABS
20.0000 mg | ORAL_TABLET | Freq: Every evening | ORAL | Status: DC
  Administered 2016-01-18 – 2016-01-21 (×4): 20 mg via ORAL
  Filled 2016-01-18 (×5): qty 1

## 2016-01-18 MED ORDER — SPIRONOLACTONE 25 MG PO TABS
50.0000 mg | ORAL_TABLET | Freq: Every day | ORAL | Status: DC
  Administered 2016-01-18 – 2016-01-20 (×3): 50 mg via ORAL
  Filled 2016-01-18 (×5): qty 2

## 2016-01-18 MED ORDER — DOCUSATE SODIUM 100 MG PO CAPS
100.0000 mg | ORAL_CAPSULE | Freq: Every day | ORAL | Status: DC
  Administered 2016-01-18 – 2016-01-20 (×3): 100 mg via ORAL
  Filled 2016-01-18 (×3): qty 1

## 2016-01-18 MED ORDER — VENLAFAXINE HCL ER 75 MG PO CP24
75.0000 mg | ORAL_CAPSULE | Freq: Every day | ORAL | Status: DC
  Administered 2016-01-18 – 2016-01-21 (×4): 75 mg via ORAL
  Filled 2016-01-18 (×4): qty 1

## 2016-01-18 MED ORDER — PRAMIPEXOLE DIHYDROCHLORIDE 0.25 MG PO TABS
0.2500 mg | ORAL_TABLET | Freq: Two times a day (BID) | ORAL | Status: DC
  Administered 2016-01-18 – 2016-01-21 (×7): 0.25 mg via ORAL
  Filled 2016-01-18 (×9): qty 1

## 2016-01-18 MED ORDER — VITAMIN D (ERGOCALCIFEROL) 1.25 MG (50000 UNIT) PO CAPS
50000.0000 [IU] | ORAL_CAPSULE | ORAL | Status: DC
  Administered 2016-01-19: 50000 [IU] via ORAL
  Filled 2016-01-18: qty 1

## 2016-01-18 NOTE — Care Management Important Message (Signed)
Important Message  Patient Details  Name: Loretta [name] MRN: [id number] Date of Birth: Jan 21, 1956   Medicare Important Message Given:  Yes    Nathen May 01/18/2016, 10:57 AM

## 2016-01-18 NOTE — Plan of Care (Signed)
Problem: Bowel/Gastric: Goal: Will show no signs and symptoms of gastrointestinal bleeding Outcome: Progressing Small amount of blood in stool. Appears to be from pt's hemroids.  Problem: Fluid Volume: Goal: Will show no signs and symptoms of excessive bleeding Outcome: Progressing H/H stable

## 2016-01-18 NOTE — Evaluation (Signed)
Physical Therapy Evaluation Patient Details Name: Loretta [name] MRN: [id number] DOB: 1956/04/23 Today's Date: 01/18/2016   History of Present Illness  60 year old female with Hx of HTN and NASH cirrhosis who presented to [facility] ED 9/10 with complaints of hematochezia x 4 days and had syncopal event in ED getting up to [facility].   Clinical Impression  Patient presents with decreased independence with mobility due to deficits listed in PT problem list.  She will benefit from skilled PT in the acute setting to allow d/c home with family support.  No current follow up PT needs as close to baseline.  Will also benefit from nursing staff assist for ambulation in the hallway.      Follow Up Recommendations No PT follow up    Equipment Recommendations  None recommended by PT    Recommendations for Other Services       Precautions / Restrictions Precautions Precautions: Fall Precaution Comments: fell three weeks ago stepping off curb at [employer] [facility] Overal bed mobility: Modified Independent                Transfers Overall transfer level: Modified independent Equipment used: None                Ambulation/Gait Ambulation/Gait assistance: Supervision Ambulation Distance (Feet): 300 Feet Assistive device: Rolling walker (2 wheeled) Gait Pattern/deviations: Step-through pattern     General Gait Details: no noted deficits with ambulation with walker, able to walk and talk and manage walker without cues  Stairs            Wheelchair Mobility    Modified Rankin (Stroke Patients Only)       Balance Overall balance assessment: Needs assistance   Sitting balance-Leahy Scale: Good     Standing balance support: No upper extremity supported Standing balance-Leahy Scale: Good Standing balance comment: walked to bathroom with minguard no device, then stood to wash hands unaided                             Pertinent  Vitals/Pain Pain Assessment: No/denies pain    Home Living Family/patient expects to be discharged to:: Private residence Living Arrangements: Spouse/significant other Available Help at Discharge: Family;Available 24 hours/day Type of Home: House Home Access: Stairs to enter Entrance Stairs-Rails: Psychiatric nurse of Steps: 5 Home Layout: One level Home Equipment: Hand held shower head;Grab bars - tub/shower;Walker - 4 wheels;Shower seat      Prior Function Level of Independence: Independent with assistive device(s)               Hand Dominance        Extremity/Trunk Assessment   Upper Extremity Assessment: Generalized weakness           Lower Extremity Assessment: Overall WFL for tasks assessed      Cervical / Trunk Assessment: Kyphotic  Communication   Communication: No difficulties  Cognition Arousal/Alertness: Awake/alert Behavior During Therapy: WFL for tasks assessed/performed Overall Cognitive Status: Within Functional Limits for tasks assessed                      General Comments General comments (skin integrity, edema, etc.): was going to water aerobics prior to this episode    Exercises        Assessment/Plan    PT Assessment Patient needs continued PT services  PT Diagnosis Generalized weakness  PT Problem List Decreased strength;Decreased balance;Decreased knowledge of use of DME;Decreased mobility;Decreased activity tolerance  PT Treatment Interventions Therapeutic activities;DME instruction;Stair training;Gait training;Functional mobility training;Therapeutic exercise;Balance training   PT Goals (Current goals can be found in the Care Plan section) Acute Rehab PT Goals Patient Stated Goal: To return to independent PT Goal Formulation: With patient Time For Goal Achievement: 01/21/16 Potential to Achieve Goals: Good    Frequency Min 3X/week   Barriers to discharge        Co-evaluation                End of Session Equipment Utilized During Treatment: Gait belt Activity Tolerance: Patient tolerated treatment well Patient left: in bed           Time: 1426-1455 PT Time Calculation (min) (ACUTE ONLY): 29 min   Charges:   PT Evaluation $PT Eval Moderate Complexity: 1 Procedure PT Treatments $Gait Training: 8-22 mins   PT G CodesReginia Naas 04-Feb-2016, 3:24 PM  Magda Kiel, [place] 02/04/2016

## 2016-01-18 NOTE — Care Management Note (Signed)
Case Management Note  Patient Details  Name: Loretta [name] MRN: [id number] Date of Birth: 04/25/1956  Subjective/Objective:       EGD 9/11 - non-bleeding gastric ulcer - ulceration at gastrojejunal anastomosis (s/p gastric bypass)              Action/Plan:  PTA from home independent with husband - uses cane for assistance with mobility.  CM will continue to follow for discharge needs   Expected Discharge Date:  01/18/16               Expected Discharge Plan:  Home/Self Care  In-House Referral:     Discharge planning Services  CM Consult  Post Acute Care Choice:    Choice offered to:     DME Arranged:    DME Agency:     HH Arranged:    HH Agency:     Status of Service:  In process, will continue to follow  If discussed at Long Length of Stay Meetings, dates discussed:    Additional Comments:  Maryclare Labrador, RN 01/18/2016, 10:26 AM

## 2016-01-18 NOTE — Progress Notes (Signed)
[place] TEAM 1 - Stepdown/ICU TEAM  Loretta [name]  VOJ:[id number] DOB: 1955/09/25 DOA: 01/15/2016 PCP: No primary care provider on file.    Brief Narrative:  60 year old female with Hx of HTN and NASH cirrhosis who presented to [facility] ED 9/10 with complaints of hematochezia x 4 days. She had 3 episodes at home prior to arrival, and 2 additional episodes in [place] ED. She also suffered a syncopal event while attempting to use the bedside commode in the ED. She remained hypotensive despite IVF resuscitation and was transferred to [facility] for further evaluation. Upon arrival to [facility] SDU she was lethargic and encephalopathic with SBP in the 80s.   Subjective: The patient is resting comfortably in a bedside chair.  She reports no nausea or vomiting shortness of breath or chest pain.  She does relate some intermittent crampy lower abdominal pain.  She is moving her bowels and is passing dark black looking stools.  She denies dizziness or lightheadedness when sitting but she has not yet been up.  Assessment & Plan:  GI bleed - anastomotic ulceration Bleeding appears to have ceased - continue PPI - avoid NSAIDs  Hemorrhagic shock - acute blood loss anemia Shock resolved - hemoglobin stabilizing - follow trend  NASH Cirrhosis w/ varices  Followed at [facility]  Severe protein calorie malnutrition Advancing diet  Acute kidney failure on CKD crt 1.08 December 2015 at [facility] - likely ATN due to hypovolemic shock - assure creatinine improving prior to discharge  Hyperkalemia Due to above - resolved  OSA  Obesity - Body mass index is 46.92 kg/m.  S/p Roux-en-Y gastric bypass surgery 2009   DVT prophylaxis: SCDs Code Status: FULL CODE Family Communication: Spoke with husband at bedside Disposition Plan: Transfer to medical bed - PT/OT - trend creatinine - anticipate discharge 48 hours or more depending upon strength  Consultants:  PCCM GI  Procedures: EGD 9/11 - non-bleeding  gastric ulcer - ulceration at gastrojejunal anastomosis (s/p gastric bypass)  Antimicrobials:  none  Objective: Blood pressure 140/75, pulse 79, temperature 97.4 F (36.3 C), temperature source Oral, resp. rate 12, height '5\' 4"'$  (1.626 m), weight 124 kg (273 lb 5.9 oz), SpO2 100 %.  Intake/Output Summary (Last 24 hours) at 01/18/16 0932 Last data filed at 01/18/16 0900  Gross per 24 hour  Intake              990 ml  Output             5076 ml  Net            -4086 ml   Filed Weights   01/16/16 0500 01/17/16 0500 01/18/16 0500  Weight: 125.6 kg (276 lb 14.4 oz) 129.9 kg (286 lb 6 oz) 124 kg (273 lb 5.9 oz)    Examination: General: No acute respiratory distress Lungs: Clear to auscultation bilaterally without wheezes or crackles Cardiovascular: Regular rate and rhythm without murmur gallop or rub normal S1 and S2 Abdomen: Nontender, obese, soft, bowel sounds positive, no rebound, no ascites, no appreciable mass Extremities: No significant cyanosis, clubbing, or edema bilateral lower extremities  CBC:  Recent Labs Lab 01/16/16 0002 01/16/16 1011 01/16/16 1028 01/16/16 1357 01/16/16 1859 01/16/16 2347  WBC 9.9  --  12.4* 14.6* 13.4* 14.9*  NEUTROABS 7.3  --   --   --   --   --   HGB 6.0* 6.5* 9.4* 10.3* 10.7* 10.3*  HCT 17.7* 19.0* 27.4* 30.3* 31.5* 29.7*  MCV 97.3  --  90.4 89.1 89.0 87.6  PLT 120*  --  66* 68* 61* 68*   Basic Metabolic Panel:  Recent Labs Lab 01/16/16 1859 01/16/16 2347 01/17/16 0451 01/17/16 1624 01/18/16 0414  NA 141 140 138 138 137  K 5.0 4.3 4.9 4.5 4.2  CL 111 110 107 108 105  CO2 '23 24 24 25 26  '$ GLUCOSE 187* 105* 94 159* 125*  BUN 39* 38* 38* 37* 36*  CREATININE 2.76* 2.72* 2.62* 2.77* 2.75*  CALCIUM 8.4* 8.0* 7.8* 7.8* 7.9*  MG  --   --  1.4*  --   --   PHOS  --   --  4.1  --   --    GFR: Estimated Creatinine Clearance: 29 mL/min (by C-G formula based on SCr of 2.75 mg/dL (H)).  Liver Function Tests:  Recent Labs Lab  01/16/16 0002  AST 39  ALT 21  ALKPHOS 69  BILITOT 1.2  PROT 3.7*  ALBUMIN 1.7*    Recent Labs Lab 01/16/16 0002  AMMONIA 50*    Coagulation Profile:  Recent Labs Lab 01/16/16 0002 01/16/16 1028  INR 2.02 1.74    Cardiac Enzymes:  Recent Labs Lab 01/16/16 0002 01/16/16 0820 01/16/16 1530  TROPONINI 0.05* 0.04* 0.05*    CBG:  Recent Labs Lab 01/15/16 2326 01/16/16 0950 01/16/16 1206 01/16/16 1429  GLUCAP 100* 110* 153* 123*    Recent Results (from the past 240 hour(s))  MRSA PCR Screening     Status: None   Collection Time: 01/15/16 11:37 PM  Result Value Ref Range Status   MRSA by PCR NEGATIVE NEGATIVE Final    Comment:        The GeneXpert MRSA Assay (FDA approved for NASAL specimens only), is one component of a comprehensive MRSA colonization surveillance program. It is not intended to diagnose MRSA infection nor to guide or monitor treatment for MRSA infections.      Scheduled Meds: . sodium chloride   Intravenous Once  . albuterol  10 mg Nebulization Once  . furosemide  60 mg Intravenous BID  . insulin aspart  10 Units Intravenous Once  . lactulose  20 g Oral TID  . pantoprazole  40 mg Oral BID AC  . rifaximin  550 mg Oral BID  . sucralfate  1 g Oral TID WC & HS     LOS: 3 days   Cherene Altes, MD [facility] Office  [phone] Pager - Text Page per Amion as per below:  On-Call/Text Page:      [email or web address]      password [email or web address]  If 7PM-7AM, please contact night-coverage [email or web address] Password [facility] 01/18/2016, 9:32 AM

## 2016-01-19 DIAGNOSIS — R578 Other shock: Secondary | ICD-10-CM | POA: Diagnosis present

## 2016-01-19 DIAGNOSIS — K7469 Other cirrhosis of liver: Secondary | ICD-10-CM | POA: Diagnosis present

## 2016-01-19 DIAGNOSIS — D62 Acute posthemorrhagic anemia: Secondary | ICD-10-CM | POA: Diagnosis present

## 2016-01-19 DIAGNOSIS — E43 Unspecified severe protein-calorie malnutrition: Secondary | ICD-10-CM | POA: Diagnosis present

## 2016-01-19 DIAGNOSIS — N179 Acute kidney failure, unspecified: Secondary | ICD-10-CM | POA: Diagnosis present

## 2016-01-19 DIAGNOSIS — N189 Chronic kidney disease, unspecified: Secondary | ICD-10-CM

## 2016-01-19 DIAGNOSIS — G4733 Obstructive sleep apnea (adult) (pediatric): Secondary | ICD-10-CM

## 2016-01-19 LAB — RENAL FUNCTION PANEL
ANION GAP: 7 (ref 5–15)
Albumin: 2.4 g/dL — ABNORMAL LOW (ref 3.5–5.0)
BUN: 29 mg/dL — ABNORMAL HIGH (ref 6–20)
CALCIUM: 8.2 mg/dL — AB (ref 8.9–10.3)
CO2: 27 mmol/L (ref 22–32)
Chloride: 105 mmol/L (ref 101–111)
Creatinine, Ser: 2.41 mg/dL — ABNORMAL HIGH (ref 0.44–1.00)
GFR calc non Af Amer: 21 mL/min — ABNORMAL LOW (ref >60)
GFR, EST AFRICAN AMERICAN: 24 mL/min — AB (ref >60)
Glucose, Bld: 112 mg/dL — ABNORMAL HIGH (ref 65–99)
Phosphorus: 3.9 mg/dL (ref 2.5–4.6)
Potassium: 4.2 mmol/L (ref 3.5–5.1)
Sodium: 139 mmol/L (ref 135–145)

## 2016-01-19 LAB — CBC
HCT: 34 % — ABNORMAL LOW (ref 36.0–46.0)
HEMOGLOBIN: 11.4 g/dL — AB (ref 12.0–15.0)
MCH: 30.4 pg (ref 26.0–34.0)
MCHC: 33.5 g/dL (ref 30.0–36.0)
MCV: 90.7 fL (ref 78.0–100.0)
PLATELETS: 90 10*3/uL — AB (ref 150–400)
RBC: 3.75 MIL/uL — AB (ref 3.87–5.11)
RDW: 15.7 % — AB (ref 11.5–15.5)
WBC: 9.1 10*3/uL (ref 4.0–10.5)

## 2016-01-19 MED ORDER — SODIUM CHLORIDE 0.9 % IV SOLN
INTRAVENOUS | Status: DC
  Administered 2016-01-20 (×3): via INTRAVENOUS

## 2016-01-19 MED ORDER — DIPHENHYDRAMINE-ZINC ACETATE 2-0.1 % EX CREA
TOPICAL_CREAM | Freq: Three times a day (TID) | CUTANEOUS | Status: DC | PRN
  Administered 2016-01-19 – 2016-01-20 (×2): via TOPICAL
  Filled 2016-01-19 (×2): qty 28

## 2016-01-19 NOTE — Progress Notes (Addendum)
PROGRESS NOTE    Loretta [name]  JAS:[id number] DOB: [birth year] DOA: 01/15/2016 PCP: No primary care provider on file.   Brief Narrative:  60 y.o. WF PMHx Depression, Liver Cirrhosis secondary to NASH, followed at [facility] but she not actively being evaluated for transplant at this time,Restless leg syndrome, OSA/OHS, CKD  Who presented to the ED at [facility] for evaluation of melena with some BRBPR for 3-4 days.  She has a history of esophageal varices, and home medications include nadolol.  She has had epigastric abdominal pain and nausea.  No significant hematemesis.  She has had increased weakness but no LOC.      Subjective: 9/14 A/O 4, only complaint is puritus RLE. Patient states fell and the same cold and injured her RLE/RU E   Assessment & Plan:   Active Problems:   Gastrointestinal hemorrhage associated with other gastritis   Encounter for central line placement   Hemorrhagic shock   Acute blood loss anemia   Liver cirrhosis secondary to NASH   Severe protein-calorie malnutrition (HCC)   Acute on chronic renal failure (HCC)   OSA (obstructive sleep apnea)   GI bleed - anastomotic ulceration -9/12 Transfused 3 PRBC -9/12 transfused 1 unit FFP Recent Labs     01/16/16  2347  01/19/16  0751  HGB  10.3*  11.4*  -Bleeding appears to have ceased  - Protonix 40 mg BID  - avoid NSAIDs  Hemorrhagic shock - acute blood loss anemia -Shock resolved - hemoglobin stabilizing - follow trend  NASH Cirrhosis w/ varices  -Followed at [facility]: Per patient not on transplant list but was/is in a study  Severe protein calorie malnutrition -Advancing diet to heart healthy  Acute kidney failure on CKD(Cr 1.08 December 2015 at [facility])  - likely ATN due to hypovolemic shock -Normal saline 100 ml/hr  - Ensure creatinine improving prior to discharge Lab Results  Component Value Date   CREATININE 2.41 (H) 01/19/2016   CREATININE 2.75 (H) 01/18/2016   CREATININE 2.77 (H) 01/17/2016      Hyperkalemia -Resolved  OSA -CPAP per respiratory  Obesity - Body mass index is 46.92 kg/m.  -S/p Roux-en-Y gastric bypass surgery 2009   DVT prophylaxis: SCD Code Status: Full Family Communication: None Disposition Plan: Next 24-48 hours   Consultants:  PCCM GI  Procedures/Significant Events:  9/12 Transfused 3 PRBC 9/12 transfused 1 unit FFP  Cultures 9/10 MRSA by PCR negative  Antimicrobials: None   Devices    LINES / TUBES:      Continuous Infusions:    Objective: Vitals:   01/19/16 0702 01/19/16 1005 01/19/16 1425 01/19/16 1530  BP: 133/64 (!) 130/56 (!) 142/58 (!) 131/54  Pulse: 76 73 74 74  Resp: '18 18 20 18  '$ Temp: 97.7 F (36.5 C) 98.1 F (36.7 C) 97.7 F (36.5 C) 98 F (36.7 C)  TempSrc: Oral Oral Oral Oral  SpO2: 96% 98% 100% 97%  Weight:      Height:        Intake/Output Summary (Last 24 hours) at 01/19/16 1937 Last data filed at 01/19/16 4193  Gross per 24 hour  Intake                0 ml  Output              450 ml  Net             -450 ml   Filed Weights   01/16/16 0500 01/17/16 0500 01/18/16 0500  Weight:  125.6 kg (276 lb 14.4 oz) 129.9 kg (286 lb 6 oz) 124 kg (273 lb 5.9 oz)    Examination:  General: A/O 4, only complaint is puritus RLE., No acute respiratory distress Eyes: negative scleral hemorrhage, negative anisocoria, negative icterus ENT: Negative Runny nose, negative gingival bleeding, Neck:  Negative scars, masses, torticollis, lymphadenopathy, JVD, large bruise right neck area where CVL was placed Lungs: Clear to auscultation bilaterally without wheezes or crackles Cardiovascular: Regular rate and rhythm without murmur gallop or rub normal S1 and S2 Abdomen: negative abdominal pain, nondistended, positive soft, bowel sounds, no rebound, no ascites, no appreciable mass Extremities: No significant cyanosis, clubbing, or edema bilateral lower extremities Skin: Positive abrasions on R LL secondary to fall  negative sign of infection  Psychiatric:  Negative depression, negative anxiety, negative fatigue, negative mania  Central nervous system:  Cranial nerves II through XII intact, tongue/uvula midline, all extremities muscle strength 5/5, sensation intact throughout,  negative dysarthria, negative expressive aphasia, negative receptive aphasia.  .     Data Reviewed: Care during the described time interval was provided by me .  I have reviewed this patient's available data, including medical history, events of note, physical examination, and all test results as part of my evaluation. I have personally reviewed and interpreted all radiology studies.  CBC:  Recent Labs Lab 01/16/16 0002  01/16/16 1028 01/16/16 1357 01/16/16 1859 01/16/16 2347 01/19/16 0751  WBC 9.9  --  12.4* 14.6* 13.4* 14.9* 9.1  NEUTROABS 7.3  --   --   --   --   --   --   HGB 6.0*  < > 9.4* 10.3* 10.7* 10.3* 11.4*  HCT 17.7*  < > 27.4* 30.3* 31.5* 29.7* 34.0*  MCV 97.3  --  90.4 89.1 89.0 87.6 90.7  PLT 120*  --  66* 68* 61* 68* 90*  < > = values in this interval not displayed. Basic Metabolic Panel:  Recent Labs Lab 01/16/16 2347 01/17/16 0451 01/17/16 1624 01/18/16 0414 01/19/16 0751  NA 140 138 138 137 139  K 4.3 4.9 4.5 4.2 4.2  CL 110 107 108 105 105  CO2 '24 24 25 26 27  '$ GLUCOSE 105* 94 159* 125* 112*  BUN 38* 38* 37* 36* 29*  CREATININE 2.72* 2.62* 2.77* 2.75* 2.41*  CALCIUM 8.0* 7.8* 7.8* 7.9* 8.2*  MG  --  1.4*  --   --   --   PHOS  --  4.1  --   --  3.9   GFR: Estimated Creatinine Clearance: 33.1 mL/min (by C-G formula based on SCr of 2.41 mg/dL (H)). Liver Function Tests:  Recent Labs Lab 01/16/16 0002 01/19/16 0751  AST 39  --   ALT 21  --   ALKPHOS 69  --   BILITOT 1.2  --   PROT 3.7*  --   ALBUMIN 1.7* 2.4*   No results for input(s): LIPASE, AMYLASE in the last 168 hours.  Recent Labs Lab 01/16/16 0002  AMMONIA 50*   Coagulation Profile:  Recent Labs Lab  01/16/16 0002 01/16/16 1028  INR 2.02 1.74   Cardiac Enzymes:  Recent Labs Lab 01/16/16 0002 01/16/16 0820 01/16/16 1530  TROPONINI 0.05* 0.04* 0.05*   BNP (last 3 results) No results for input(s): PROBNP in the last 8760 hours. HbA1C: No results for input(s): HGBA1C in the last 72 hours. CBG:  Recent Labs Lab 01/15/16 2326 01/16/16 0950 01/16/16 1206 01/16/16 1429  GLUCAP 100* 110* 153* 123*   Lipid  Profile: No results for input(s): CHOL, HDL, LDLCALC, TRIG, CHOLHDL, LDLDIRECT in the last 72 hours. Thyroid Function Tests: No results for input(s): TSH, T4TOTAL, FREET4, T3FREE, THYROIDAB in the last 72 hours. Anemia Panel: No results for input(s): VITAMINB12, FOLATE, FERRITIN, TIBC, IRON, RETICCTPCT in the last 72 hours. Urine analysis: No results found for: COLORURINE, APPEARANCEUR, LABSPEC, PHURINE, GLUCOSEU, HGBUR, BILIRUBINUR, KETONESUR, PROTEINUR, UROBILINOGEN, NITRITE, LEUKOCYTESUR Sepsis Labs: '@LABRCNTIP'$ (procalcitonin:4,lacticidven:4)  ) Recent Results (from the past 240 hour(s))  MRSA PCR Screening     Status: None   Collection Time: 01/15/16 11:37 PM  Result Value Ref Range Status   MRSA by PCR NEGATIVE NEGATIVE Final    Comment:        The GeneXpert MRSA Assay (FDA approved for NASAL specimens only), is one component of a comprehensive MRSA colonization surveillance program. It is not intended to diagnose MRSA infection nor to guide or monitor treatment for MRSA infections.          Radiology Studies: No results found.      Scheduled Meds: . docusate sodium  100 mg Oral QHS  . folic acid  1 mg Oral Daily  . lactulose  20 g Oral TID  . multivitamin with minerals   Oral Daily  . nadolol  20 mg Oral QPM  . pantoprazole  40 mg Oral BID AC  . pramipexole  0.25 mg Oral BID  . rifaximin  550 mg Oral BID  . spironolactone  50 mg Oral Daily  . sucralfate  1 g Oral TID WC & HS  . venlafaxine XR  75 mg Oral Daily  . Vitamin D  (Ergocalciferol)  50,000 Units Oral Once per day on Mon Thu   Continuous Infusions:    LOS: 4 days    Time spent: 40 minutes    Rolla Servidio, Geraldo Docker, MD [facility] Pager [phone]   If 7PM-7AM, please contact night-coverage [email or web address] Password [facility] 01/19/2016, 7:37 PM

## 2016-01-19 NOTE — Progress Notes (Signed)
Physical Therapy Treatment Patient Details Name: Loretta [name] MRN: [id number] DOB: [name] 13, 1957 Today's Date: 01/19/2016    History of Present Illness 60 year old female with Hx of HTN and NASH cirrhosis who presented to [facility] ED 9/10 with complaints of hematochezia x 4 days and had syncopal event in ED getting up to [facility].     PT Comments    Patient continues to demo decreased activity tolerance and SOB/fatigue with stair management. Overall supervision/min guard for all mobility. HEP next session. Current plan remains appropriate.   Follow Up Recommendations  No PT follow up     Equipment Recommendations  None recommended by PT    Recommendations for Other Services       Precautions / Restrictions Precautions Precautions: Fall Precaution Comments: fell three weeks ago stepping off curb at [employer] Bearing Restrictions: No    Mobility  Bed Mobility Overal bed mobility: Modified Independent                Transfers Overall transfer level: Modified independent                  Ambulation/Gait Ambulation/Gait assistance: Supervision Ambulation Distance (Feet): 250 Feet Assistive device: Rolling walker (2 wheeled) Gait Pattern/deviations: Step-through pattern;Decreased stride length     General Gait Details: cues for posture   Stairs Stairs: Yes Stairs assistance: Min guard Stair Management: One rail Right;Sideways;Step to pattern Number of Stairs: 5 General stair comments: cues for sequencing/technique and safety; pt with SOB and fatigued with stair management  Wheelchair Mobility    Modified Rankin (Stroke Patients Only)       Balance     Sitting balance-Leahy Scale: Good       Standing balance-Leahy Scale: Good                      Cognition Arousal/Alertness: Awake/alert Behavior During Therapy: WFL for tasks assessed/performed Overall Cognitive Status: Within Functional Limits for tasks  assessed                      Exercises      General Comments        Pertinent Vitals/Pain Pain Assessment: No/denies pain    Home Living                      Prior Function            PT Goals (current goals can now be found in the care plan section) Acute Rehab PT Goals Patient Stated Goal: To return to independent Progress towards PT goals: Progressing toward goals    Frequency  Min 3X/week    PT Plan Current plan remains appropriate    Co-evaluation             End of Session Equipment Utilized During Treatment: Gait belt Activity Tolerance: Patient tolerated treatment well Patient left: in chair;with call bell/phone within reach     Time: 1218-1246 PT Time Calculation (min) (ACUTE ONLY): 28 min  Charges:  $Gait Training: 8-22 mins $Therapeutic Activity: 8-22 mins                    G Codes:      Loretta [name], PTA Pager: [phone]   01/19/2016, 1:33 PM

## 2016-01-20 DIAGNOSIS — N189 Chronic kidney disease, unspecified: Secondary | ICD-10-CM

## 2016-01-20 DIAGNOSIS — R579 Shock, unspecified: Secondary | ICD-10-CM

## 2016-01-20 DIAGNOSIS — E43 Unspecified severe protein-calorie malnutrition: Secondary | ICD-10-CM

## 2016-01-20 DIAGNOSIS — N179 Acute kidney failure, unspecified: Secondary | ICD-10-CM

## 2016-01-20 LAB — CBC WITH DIFFERENTIAL/PLATELET
Basophils Absolute: 0 10*3/uL (ref 0.0–0.1)
Basophils Relative: 1 %
EOS PCT: 9 %
Eosinophils Absolute: 0.8 10*3/uL — ABNORMAL HIGH (ref 0.0–0.7)
HCT: 31.3 % — ABNORMAL LOW (ref 36.0–46.0)
Hemoglobin: 10.4 g/dL — ABNORMAL LOW (ref 12.0–15.0)
LYMPHS ABS: 2.3 10*3/uL (ref 0.7–4.0)
LYMPHS PCT: 27 %
MCH: 30.2 pg (ref 26.0–34.0)
MCHC: 33.2 g/dL (ref 30.0–36.0)
MCV: 91 fL (ref 78.0–100.0)
Monocytes Absolute: 1 10*3/uL (ref 0.1–1.0)
Monocytes Relative: 12 %
Neutro Abs: 4.2 10*3/uL (ref 1.7–7.7)
Neutrophils Relative %: 51 %
PLATELETS: 97 10*3/uL — AB (ref 150–400)
RBC: 3.44 MIL/uL — AB (ref 3.87–5.11)
RDW: 15.6 % — ABNORMAL HIGH (ref 11.5–15.5)
WBC: 8.4 10*3/uL (ref 4.0–10.5)

## 2016-01-20 LAB — COMPREHENSIVE METABOLIC PANEL
ALBUMIN: 2.2 g/dL — AB (ref 3.5–5.0)
ALT: 32 U/L (ref 14–54)
AST: 69 U/L — AB (ref 15–41)
Alkaline Phosphatase: 76 U/L (ref 38–126)
Anion gap: 4 — ABNORMAL LOW (ref 5–15)
BUN: 24 mg/dL — AB (ref 6–20)
CHLORIDE: 107 mmol/L (ref 101–111)
CO2: 25 mmol/L (ref 22–32)
Calcium: 8 mg/dL — ABNORMAL LOW (ref 8.9–10.3)
Creatinine, Ser: 1.96 mg/dL — ABNORMAL HIGH (ref 0.44–1.00)
GFR calc Af Amer: 31 mL/min — ABNORMAL LOW (ref >60)
GFR, EST NON AFRICAN AMERICAN: 27 mL/min — AB (ref >60)
GLUCOSE: 87 mg/dL (ref 65–99)
POTASSIUM: 4.4 mmol/L (ref 3.5–5.1)
SODIUM: 136 mmol/L (ref 135–145)
Total Bilirubin: 1.5 mg/dL — ABNORMAL HIGH (ref 0.3–1.2)
Total Protein: 4.4 g/dL — ABNORMAL LOW (ref 6.5–8.1)

## 2016-01-20 LAB — PROTIME-INR
INR: 1.45
Prothrombin Time: 17.8 seconds — ABNORMAL HIGH (ref 11.4–15.2)

## 2016-01-20 LAB — MAGNESIUM: Magnesium: 2 mg/dL (ref 1.7–2.4)

## 2016-01-20 LAB — LACTIC ACID, PLASMA: Lactic Acid, Venous: 1.5 mmol/L (ref 0.5–1.9)

## 2016-01-20 MED ORDER — LACTULOSE 10 GM/15ML PO SOLN
20.0000 g | Freq: Two times a day (BID) | ORAL | Status: DC
  Administered 2016-01-20 – 2016-01-21 (×2): 20 g via ORAL
  Filled 2016-01-20 (×2): qty 30

## 2016-01-20 NOTE — Care Management Important Message (Signed)
Important Message  Patient Details  Name: Loretta [name] MRN: [id number] Date of Birth: 06/18/1955   Medicare Important Message Given:  Yes    Nathen May 01/20/2016, 11:45 AM

## 2016-01-20 NOTE — Evaluation (Signed)
Occupational Therapy Evaluation/Discharge Patient Details Name: Loretta [name] MRN: [id number] DOB: 1956-03-07 Today's Date: 01/20/2016    History of Present Illness 60 year old female with Hx of HTN and NASH cirrhosis who presented to [facility] ED 9/10 with complaints of hematochezia x 4 days and had syncopal event in ED getting up to [facility].    Clinical Impression   Patient has been evaluated by Occupational Therapy with no acute OT needs identified. Pt was able to complete all basic ADLs and transfers with modified independence using RW and without AD. Educated pt on fall prevention strategies. All education has been completed and pt has no further questions. Pt with no further acute OT needs. OT signing off. Thank you for this referral.    Follow Up Recommendations  No OT follow up    Equipment Recommendations  None recommended by OT    Recommendations for Other Services       Precautions / Restrictions Precautions Precautions: Fall Precaution Comments: fell three weeks ago stepping off curb at [employer] Bearing Restrictions: No      Mobility Bed Mobility Overal bed mobility: Modified Independent                Transfers Overall transfer level: Modified independent Equipment used: Rolling walker (2 wheeled);None                  Balance Overall balance assessment: No apparent balance deficits (not formally assessed)   Sitting balance-Leahy Scale: Good       Standing balance-Leahy Scale: Good                              ADL Overall ADL's : Modified independent                                             Vision Vision Assessment?: No apparent visual deficits   Perception     Praxis      Pertinent Vitals/Pain Pain Assessment: No/denies pain     Hand Dominance Right   Extremity/Trunk Assessment Upper Extremity Assessment Upper Extremity Assessment: Overall WFL for tasks assessed    Lower Extremity Assessment Lower Extremity Assessment: Defer to PT evaluation   Cervical / Trunk Assessment Cervical / Trunk Assessment: Kyphotic   Communication Communication Communication: No difficulties   Cognition Arousal/Alertness: Awake/alert Behavior During Therapy: WFL for tasks assessed/performed Overall Cognitive Status: Within Functional Limits for tasks assessed                     General Comments       Exercises       Shoulder Instructions      Home Living Family/patient expects to be discharged to:: Private residence Living Arrangements: Spouse/significant other Available Help at Discharge: Family;Available 24 hours/day Type of Home: House Home Access: Stairs to enter [employer] of Steps: 5 Entrance Stairs-Rails: Right;Left Home Layout: One level     Bathroom Shower/Tub: Tub/shower unit Shower/tub characteristics: Curtain Biochemist, clinical: Standard     Home Equipment: Environmental consultant - 4 wheels;Shower seat;Grab bars - tub/shower;Hand held shower head          Prior Functioning/Environment Level of Independence: Independent with assistive device(s)        Comments: Uses 4ww    OT Diagnosis:   Encounter for central line  placement - Plan: DG CHEST PORT 1 VIEW, DG CHEST PORT 1 VIEW, CANCELED: DG CHEST PORT 1 VIEW, CANCELED: DG CHEST PORT 1 VIEW    OT Problem List: Decreased safety awareness;Obesity   OT Treatment/Interventions:      OT Goals(Current goals can be found in the care plan section) Acute Rehab OT Goals Patient Stated Goal: To return to independent OT Goal Formulation: With patient Time For Goal Achievement: 02/03/16 Potential to Achieve Goals: Good  OT Frequency:     Barriers to D/C:            Co-evaluation              End of Session Equipment Utilized During Treatment: Gait belt;Rolling walker Nurse Communication: Mobility status  Activity Tolerance: Patient tolerated treatment well Patient left:  in bed;with call bell/phone within reach;with bed alarm set;with family/visitor present   Time: 4604-7998 OT Time Calculation (min): 9 min Charges:  OT General Charges $OT Visit: 1 Procedure OT Evaluation $OT Eval Low Complexity: 1 Procedure G-Codes:    Redmond Baseman, OTR/L Pager: [phone] 01/20/2016, 11:51 AM

## 2016-01-20 NOTE — Progress Notes (Signed)
Physical Therapy Treatment Patient Details Name: Loretta [name] MRN: [id number] DOB: [birth year] Today's Date: 01/20/2016    History of Present Illness 60 year old female with Hx of HTN and NASH cirrhosis who presented to [facility] ED 9/10 with complaints of hematochezia x 4 days and had syncopal event in ED getting up to [facility].     PT Comments    Patient tolerated gait/stairs with less SOB this session. Reviewed HEP and given handout. Current plan remains appropriate.   Follow Up Recommendations  No PT follow up     Equipment Recommendations  None recommended by PT    Recommendations for Other Services       Precautions / Restrictions Precautions Precautions: Fall Precaution Comments: fell three weeks ago stepping off curb at [employer] Bearing Restrictions: No    Mobility  Bed Mobility Overal bed mobility: Modified Independent                Transfers Overall transfer level: Modified independent                  Ambulation/Gait Ambulation/Gait assistance: Supervision Ambulation Distance (Feet): 300 Feet Assistive device: Rolling walker (2 wheeled) Gait Pattern/deviations: Step-through pattern;Decreased stride length     General Gait Details: cues for posture and proximity of RW; unsteady with single UE support but no LOB and recovered without assist   Stairs   Stairs assistance: Min guard Stair Management: One rail Right;Sideways;Step to pattern Number of Stairs:  (4X4) General stair comments: cues for sequencing/technique; min guard for safety; a little winded/fatigued but less so than yesterday's session  Wheelchair Mobility    Modified Rankin (Stroke Patients Only)       Balance     Sitting balance-Leahy Scale: Good       Standing balance-Leahy Scale: Good                      Cognition Arousal/Alertness: Awake/alert Behavior During Therapy: WFL for tasks assessed/performed Overall Cognitive  Status: Within Functional Limits for tasks assessed                      Exercises      General Comments General comments (skin integrity, edema, etc.): HEP reviewed with pt and given handout      Pertinent Vitals/Pain Pain Assessment: No/denies pain    Home Living                      Prior Function            PT Goals (current goals can now be found in the care plan section) Acute Rehab PT Goals Patient Stated Goal: To return to independent Progress towards PT goals: Progressing toward goals    Frequency  Min 3X/week        PT Plan Current plan remains appropriate    Co-evaluation             End of Session Equipment Utilized During Treatment: Gait belt Activity Tolerance: Patient tolerated treatment well Patient left: with call bell/phone within reach;in bed;with bed alarm set     Time: 2836-6294 PT Time Calculation (min) (ACUTE ONLY): 26 min  Charges:  $Gait Training: 8-22 mins $Therapeutic Activity: 8-22 mins                    G Codes:      Salina April, PTA Pager: [phone]  01/20/2016, 10:26 AM

## 2016-01-20 NOTE — Progress Notes (Signed)
Loretta [name]  ZWC:[id number] DOB: January 22, 1956 DOA: 01/15/2016 PCP: No primary care provider on file.    Brief Narrative:  39 ? HTN and  NASH cirrhosis f/u [facility] Formally diagnosed with obstructive sleep apnea but turns out has severe restless leg syndrome   presented to [facility] ED 9/10 with complaints of hematochezia x 4 days.  She had 3 episodes at home prior to arrival, and 2 additional episodes in [place] ED.   suffered a syncopal event while attempting to use the bedside commode in the ED.   remained hypotensive despite IVF resuscitation and was transferred to [facility] for further evaluation. Initial hemoglobin was 6 platelets were in the 60 range Upon arrival to [place] she was lethargic and encephalopathic with SBP in the 80s.   Subjective: Doing fair sitting up in the bed eating breakfast Has not had any further bleeding stool seems [employer] this morning Had some nausea last night Has not had any paracentesis or other issues in the past States has itching of her right lower extremity and is using a cream with that Does not feel short of breath has no chest pain  Assessment/Plan  GI bleed - anastomotic ulceration Bleeding appears to have ceased - continue PPI Protonix 40 po bd - avoid NSAIDs Cont Carafate 1 gm tid ac Will need follow-up as an outpatient with gastroenterology who helps manage her versus surgeon  Hemorrhagic shock - acute blood loss anemia Shock resolved - hemoglobin stabilizing - follow trend  NASH Cirrhosis w/ varices  Mild thrombocytopenia related to the NASH Meld score this admission Followed at [facility] to resume nadolol 20 mg daily Given creatinine worsening subacute B Will hold off on Aldactone 50 mg daily, Lasix 20 mg already on hold Continue Xifaxan 550 twice a day,   lactulose causing 1111 severe diarrhea so we will lower the dose a little bit to 2 times a day 20 mg  Severe protein calorie malnutrition Advancing diet  Acute kidney  failure on CKD crt 1.08 December 2015 at [facility] - likely ATN due to hypovolemic shock - assure creatinine improving prior to discharge -on NS 100 cc/hr -See above discussion  Hyperkalemia Due to above - resolved  OSA/RLS Follow at [facility] for this issue Doesn't use cpap  Obesity - Body mass index is 46.92 kg/m.  S/p Roux-en-Y gastric bypass surgery 2009   DVT prophylaxis: SCDs Code Status: FULL CODE Family Communication: no family + at the bedside currently Disposition Plan:  PT/OT - trend creatinine - anticipate discharge 48 hours or more depending upon resolution of labs from admission as her baseline creatinines in the 1.2-1.3 range  Consultants:  PCCM GI  Procedures: EGD 9/11 - non-bleeding gastric ulcer - ulceration at gastrojejunal anastomosis (s/p gastric bypass)  Antimicrobials:  none  Objective: Blood pressure (!) 120/48, pulse 71, temperature 97.9 F (36.6 C), temperature source Oral, resp. rate 20, height '5\' 4"'$  (1.626 m), weight 124 kg (273 lb 5.9 oz), SpO2 99 %.  Intake/Output Summary (Last 24 hours) at 01/20/16 0847 Last data filed at 01/19/16 2353  Gross per 24 hour  Intake                0 ml  Output              100 ml  Net             -100 ml   Filed Weights   01/16/16 0500 01/17/16 0500 01/18/16 0500  Weight: 125.6 kg (  276 lb 14.4 oz) 129.9 kg (286 lb 6 oz) 124 kg (273 lb 5.9 oz)    Examination: General: No acute respiratory distress Lungs: Clear to auscultation bilaterally without wheezes or crackles Cardiovascular: Regular rate and rhythm without murmur gallop or rub normal S1 and S2 Abdomen: Nontender, obese, soft, bowel sounds positive, no rebound, no ascites, no appreciable mass Extremities: No significant cyanosis,She does have a rash on the right lower extremity  CBC:  Recent Labs Lab 01/16/16 0002  01/16/16 1028 01/16/16 1357 01/16/16 1859 01/16/16 2347 01/19/16 0751  WBC 9.9  --  12.4* 14.6* 13.4* 14.9* 9.1  NEUTROABS 7.3  --   --    --   --   --   --   HGB 6.0*  < > 9.4* 10.3* 10.7* 10.3* 11.4*  HCT 17.7*  < > 27.4* 30.3* 31.5* 29.7* 34.0*  MCV 97.3  --  90.4 89.1 89.0 87.6 90.7  PLT 120*  --  66* 68* 61* 68* 90*  < > = values in this interval not displayed. Basic Metabolic Panel:  Recent Labs Lab 01/16/16 2347 01/17/16 0451 01/17/16 1624 01/18/16 0414 01/19/16 0751  NA 140 138 138 137 139  K 4.3 4.9 4.5 4.2 4.2  CL 110 107 108 105 105  CO2 '24 24 25 26 27  '$ GLUCOSE 105* 94 159* 125* 112*  BUN 38* 38* 37* 36* 29*  CREATININE 2.72* 2.62* 2.77* 2.75* 2.41*  CALCIUM 8.0* 7.8* 7.8* 7.9* 8.2*  MG  --  1.4*  --   --   --   PHOS  --  4.1  --   --  3.9   GFR: Estimated Creatinine Clearance: 33.1 mL/min (by C-G formula based on SCr of 2.41 mg/dL (H)).  Liver Function Tests:  Recent Labs Lab 01/16/16 0002 01/19/16 0751  AST 39  --   ALT 21  --   ALKPHOS 69  --   BILITOT 1.2  --   PROT 3.7*  --   ALBUMIN 1.7* 2.4*    Recent Labs Lab 01/16/16 0002  AMMONIA 50*    Coagulation Profile:  Recent Labs Lab 01/16/16 0002 01/16/16 1028  INR 2.02 1.74    Cardiac Enzymes:  Recent Labs Lab 01/16/16 0002 01/16/16 0820 01/16/16 1530  TROPONINI 0.05* 0.04* 0.05*    CBG:  Recent Labs Lab 01/15/16 2326 01/16/16 0950 01/16/16 1206 01/16/16 1429  GLUCAP 100* 110* 153* 123*    Recent Results (from the past 240 hour(s))  MRSA PCR Screening     Status: None   Collection Time: 01/15/16 11:37 PM  Result Value Ref Range Status   MRSA by PCR NEGATIVE NEGATIVE Final    Comment:        The GeneXpert MRSA Assay (FDA approved for NASAL specimens only), is one component of a comprehensive MRSA colonization surveillance program. It is not intended to diagnose MRSA infection nor to guide or monitor treatment for MRSA infections.      Scheduled Meds: . docusate sodium  100 mg Oral QHS  . folic acid  1 mg Oral Daily  . lactulose  20 g Oral TID  . multivitamin with minerals   Oral Daily  .  nadolol  20 mg Oral QPM  . pantoprazole  40 mg Oral BID AC  . pramipexole  0.25 mg Oral BID  . rifaximin  550 mg Oral BID  . spironolactone  50 mg Oral Daily  . sucralfate  1 g Oral TID WC & HS  .  venlafaxine XR  75 mg Oral Daily  . Vitamin D (Ergocalciferol)  50,000 Units Oral Once per day on Mon Thu     LOS: 5 days   Verneita Griffes, MD [facility] ([phone]   If 7PM-7AM, please contact night-coverage [email or web address] Password [facility] 01/20/2016, 8:47 AM

## 2016-01-20 NOTE — Progress Notes (Signed)
Patient refuses CPAP for the night. Patient states she has restless leg and not OSA.

## 2016-01-21 LAB — CBC WITH DIFFERENTIAL/PLATELET
Basophils Absolute: 0.1 10*3/uL (ref 0.0–0.1)
Basophils Relative: 1 %
EOS ABS: 0.9 10*3/uL — AB (ref 0.0–0.7)
Eosinophils Relative: 11 %
HCT: 31.7 % — ABNORMAL LOW (ref 36.0–46.0)
HEMOGLOBIN: 10.5 g/dL — AB (ref 12.0–15.0)
Lymphocytes Relative: 27 %
Lymphs Abs: 2.2 10*3/uL (ref 0.7–4.0)
MCH: 30.3 pg (ref 26.0–34.0)
MCHC: 33.1 g/dL (ref 30.0–36.0)
MCV: 91.6 fL (ref 78.0–100.0)
MONOS PCT: 16 %
Monocytes Absolute: 1.3 10*3/uL — ABNORMAL HIGH (ref 0.1–1.0)
NEUTROS PCT: 46 %
Neutro Abs: 3.8 10*3/uL (ref 1.7–7.7)
Platelets: 102 10*3/uL — ABNORMAL LOW (ref 150–400)
RBC: 3.46 MIL/uL — ABNORMAL LOW (ref 3.87–5.11)
RDW: 16.2 % — ABNORMAL HIGH (ref 11.5–15.5)
WBC: 8.2 10*3/uL (ref 4.0–10.5)

## 2016-01-21 LAB — COMPREHENSIVE METABOLIC PANEL
ALK PHOS: 82 U/L (ref 38–126)
ALT: 32 U/L (ref 14–54)
AST: 62 U/L — ABNORMAL HIGH (ref 15–41)
Albumin: 2.3 g/dL — ABNORMAL LOW (ref 3.5–5.0)
Anion gap: 4 — ABNORMAL LOW (ref 5–15)
BILIRUBIN TOTAL: 1.9 mg/dL — AB (ref 0.3–1.2)
BUN: 20 mg/dL (ref 6–20)
CALCIUM: 7.9 mg/dL — AB (ref 8.9–10.3)
CO2: 24 mmol/L (ref 22–32)
CREATININE: 1.85 mg/dL — AB (ref 0.44–1.00)
Chloride: 110 mmol/L (ref 101–111)
GFR, EST AFRICAN AMERICAN: 34 mL/min — AB (ref >60)
GFR, EST NON AFRICAN AMERICAN: 29 mL/min — AB (ref >60)
Glucose, Bld: 98 mg/dL (ref 65–99)
Potassium: 4.3 mmol/L (ref 3.5–5.1)
Sodium: 138 mmol/L (ref 135–145)
TOTAL PROTEIN: 4.7 g/dL — AB (ref 6.5–8.1)

## 2016-01-21 MED ORDER — PANTOPRAZOLE SODIUM 40 MG PO TBEC: 40.0000 mg | DELAYED_RELEASE_TABLET | Freq: Two times a day (BID) | ORAL | 0 refills | Status: AC

## 2016-01-21 MED ORDER — SPIRONOLACTONE 50 MG PO TABS: 50.0000 mg | ORAL_TABLET | Freq: Every day | ORAL | Status: DC

## 2016-01-21 MED ORDER — FUROSEMIDE 20 MG PO TABS: 20.0000 mg | ORAL_TABLET | Freq: Two times a day (BID) | ORAL | Status: DC

## 2016-01-21 NOTE — Progress Notes (Signed)
Pt discharged home in care of husband with destination of home. No distress noted. Vitals stable. No pain. Alert, oriented x4, appropriate. IV access removed. Discharge instructions and prescription for Protonix given to patient/husband and discussed thoroughly, after having her permission to speak in front of him. Emphasis placed on need for patients PCP to obtain BMP within the next 4 days, so PCP can evaluate Creatinine and determine if it is safe for her to restart Lasix and Aldactone. Pt instructed not to take Lasix or Aldactone without her PCP's verbal orders. Prescription given with discharge papers. Taken via wheelchair to POV.

## 2016-01-21 NOTE — Discharge Summary (Signed)
Physician Discharge Summary  Loretta [name] HWE:[id number] DOB: 03/27/1956 DOA: 01/15/2016  PCP: Smitty Cords  Admit date: 01/15/2016 Discharge date: 01/21/2016  Time spent: 45 minutes  Recommendations for Outpatient Follow-up:  1. PCP Dr/Berkhart in [place] in 1 week, please check labs at [place] and resume lasix/aldactone upon FU if appropriate 2. FU with Gi at [place]   Discharge Diagnoses:  Active Problems:   Gastrointestinal hemorrhage associated with other gastritis   Encounter for central line placement   Hemorrhagic shock   Acute blood loss anemia   Liver cirrhosis secondary to NASH   Severe protein-calorie malnutrition (HCC)   Acute on chronic renal failure (HCC)   OSA (obstructive sleep apnea)   Discharge Condition: stable  Diet recommendation: Low sodium  Filed Weights   01/16/16 0500 01/17/16 0500 01/18/16 0500  Weight: 125.6 kg (276 lb 14.4 oz) 129.9 kg (286 lb 6 oz) 124 kg (273 lb 5.9 oz)    History of present illness:   Loretta [name] is a 60 y.o. woman with a history of cirrhosis secondary to NASH, followed at [facility] but she not actively being evaluated for transplant at this time, who presented to the ED at [facility] for evaluation of melena with some BRBPR for 3-4 days.  She has a history of esophageal varices, and home medications include nadolol  Hospital Course:   GI bleed -  -admitted with hemorrhagic shock, required pressors in ICU initially with blood -GI consulted, underwent EGD with non bleeding gastric ulcer and anastomotic ulceration at Bypass site, no varices noted -s/p 5units PRBC on admission and hemorrhagic shock with AKI/ATN -Bleeding resolved, Hb stable - continue PPI Protonix 40 po bd - avoid NSAIDs -FU with GI at [name]  Hemorrhagic shock - acute blood loss anemia Shock resolved  NASH Cirrhosis w/ varices  -Followed at [facility] -Resumed nadolol 20 mg daily -Given AKI, we held Aldactone 50 mg daily, Lasix 20 mg-resume in 1 week per discussion  below Continue Xifaxan 550 twice a day, and lactulose  Severe protein calorie malnutrition -in context of acute illness -supplements advised by RD  Acute kidney failure on CKD crt 1.08 December 2015 at [facility] - likely ATN due to hypovolemic shock  -improved with hydration and blood transfusion from 2.5 on admission to 1.8 at discharge today, holding lasix and aldactone for few3 more days, resume this after PCP FU after checking labs  Hyperkalemia Due to above - resolved  OSA/RLS Follow at [facility] for this issue Doesn't use cpap  Obesity - Body mass index is 46.92 kg/m.  S/p Roux-en-Y gastric bypass surgery 2009   Procedures: EGD 9/11 - non-bleeding gastric ulcer - ulceration at gastrojejunal anastomosis (s/p gastric bypass)  Consultations:  [facility]  Discharge Exam: Vitals:   01/20/16 2112 01/21/16 0519  BP: (!) 135/53 (!) 114/54  Pulse: 71 67  Resp: 18 20  Temp: 98.2 F (36.8 C) 98.2 F (36.8 C)    General: AAOx3 Cardiovascular: S1S2/RRR Respiratory: CTAB  Discharge Instructions   Discharge Instructions    Diet - low sodium heart healthy    Complete by:  As directed    Increase activity slowly    Complete by:  As directed      Current Discharge Medication List    START taking these medications   Details  pantoprazole (PROTONIX) 40 MG tablet Take 1 tablet (40 mg total) by mouth 2 (two) times daily before a meal. Qty: 60 tablet, Refills: 0      CONTINUE these medications which have  CHANGED   Details  furosemide (LASIX) 20 MG tablet Take 1 tablet (20 mg total) by mouth 2 (two) times daily. Restart in 5-6days after FU with PCP Qty: 30 tablet    spironolactone (ALDACTONE) 50 MG tablet Take 1 tablet (50 mg total) by mouth daily. Restart in 5-6days after FU with PCP      CONTINUE these medications which have NOT CHANGED   Details  docusate sodium (COLACE) 100 MG capsule Take 100 mg by mouth at bedtime.    folic acid (FOLVITE) 1 MG tablet Take 1 mg by  mouth daily.    KRISTALOSE 20 g packet Take 20 g by mouth 2 (two) times daily.     Melatonin 3 MG TABS Take 3 mg by mouth at bedtime.    milk thistle 175 MG tablet Take 175 mg by mouth 2 (two) times daily.    Multiple Vitamin (MULTI-VITAMIN PO) Take 1 tablet by mouth daily.    nadolol (CORGARD) 20 MG tablet Take 20 mg by mouth every evening.     pramipexole (MIRAPEX) 0.25 MG tablet Take 0.25 mg by mouth 2 (two) times daily.    Probiotic Product (PROBIOTIC PO) Take 1 tablet by mouth daily.    venlafaxine (EFFEXOR) 25 MG tablet Take 75 mg by mouth daily.    Vitamin D, Ergocalciferol, (DRISDOL) 50000 units CAPS capsule Take 50,000 Units by mouth 2 (two) times a week. Tues / Thurs    XIFAXAN 550 MG TABS tablet Take 550 mg by mouth 2 (two) times daily.      STOP taking these medications     KLOR-CON M10 10 MEQ tablet        Allergies  Allergen Reactions  . Codeine   . Cymbalta [Duloxetine Hcl]   . Levaquin [Levofloxacin In D5w]   . Tomato Itching   Follow-up Information    PCP .   Why:  in [place] in 4-5days, needs labs (Bmet) checked at Follow up           The results of significant diagnostics from this hospitalization (including imaging, microbiology, ancillary and laboratory) are listed below for reference.    Significant Diagnostic Studies: Dg Chest Port 1 View  Result Date: 01/16/2016 CLINICAL DATA:  60 year old female with central line placement. Initial encounter. EXAM: PORTABLE CHEST 1 VIEW COMPARISON:  None. FINDINGS: Right internal jugular catheter tip proximal superior vena cava level. No infiltrate, congestive heart failure or pneumothorax. Central pulmonary vascular prominence may be related to the AP medication. Heart size within normal limits. IMPRESSION: Right internal jugular catheter tip proximal superior vena cava level. Electronically Signed   By: Genia Del M.D.   On: 01/16/2016 14:56    Microbiology: Recent Results (from the past 240  hour(s))  MRSA PCR Screening     Status: None   Collection Time: 01/15/16 11:37 PM  Result Value Ref Range Status   MRSA by PCR NEGATIVE NEGATIVE Final    Comment:        The GeneXpert MRSA Assay (FDA approved for NASAL specimens only), is one component of a comprehensive MRSA colonization surveillance program. It is not intended to diagnose MRSA infection nor to guide or monitor treatment for MRSA infections.      Labs: Basic Metabolic Panel:  Recent Labs Lab 01/17/16 0451 01/17/16 1624 01/18/16 0414 01/19/16 0751 01/20/16 0819 01/21/16 0537  NA 138 138 137 139 136 138  K 4.9 4.5 4.2 4.2 4.4 4.3  CL 107 108 105 105 107 110  CO2 '24 25 26 27 25 24  '$ GLUCOSE 94 159* 125* 112* 87 98  BUN 38* 37* 36* 29* 24* 20  CREATININE 2.62* 2.77* 2.75* 2.41* 1.96* 1.85*  CALCIUM 7.8* 7.8* 7.9* 8.2* 8.0* 7.9*  MG 1.4*  --   --   --  2.0  --   PHOS 4.1  --   --  3.9  --   --    Liver Function Tests:  Recent Labs Lab 01/16/16 0002 01/19/16 0751 01/20/16 0819 01/21/16 0537  AST 39  --  69* 62*  ALT 21  --  32 32  ALKPHOS 69  --  76 82  BILITOT 1.2  --  1.5* 1.9*  PROT 3.7*  --  4.4* 4.7*  ALBUMIN 1.7* 2.4* 2.2* 2.3*   No results for input(s): LIPASE, AMYLASE in the last 168 hours.  Recent Labs Lab 01/16/16 0002  AMMONIA 50*   CBC:  Recent Labs Lab 01/16/16 0002  01/16/16 1859 01/16/16 2347 01/19/16 0751 01/20/16 0819 01/21/16 0537  WBC 9.9  < > 13.4* 14.9* 9.1 8.4 8.2  NEUTROABS 7.3  --   --   --   --  4.2 3.8  HGB 6.0*  < > 10.7* 10.3* 11.4* 10.4* 10.5*  HCT 17.7*  < > 31.5* 29.7* 34.0* 31.3* 31.7*  MCV 97.3  < > 89.0 87.6 90.7 91.0 91.6  PLT 120*  < > 61* 68* 90* 97* 102*  < > = values in this interval not displayed. Cardiac Enzymes:  Recent Labs Lab 01/16/16 0002 01/16/16 0820 01/16/16 1530  TROPONINI 0.05* 0.04* 0.05*   BNP: BNP (last 3 results) No results for input(s): BNP in the last 8760 hours.  ProBNP (last 3 results) No results for  input(s): PROBNP in the last 8760 hours.  CBG:  Recent Labs Lab 01/15/16 2326 01/16/16 0950 01/16/16 1206 01/16/16 1429  GLUCAP 100* 110* 153* 123*       Signed:  Harman Ferrin MD.  [facility] 01/21/2016, 8:26 AM

## 2016-01-21 NOTE — Progress Notes (Signed)
Pt has discharge orders, however she states her husband cannot be here to transport her home until 1600.

## 2016-01-26 DIAGNOSIS — E875 Hyperkalemia: Secondary | ICD-10-CM | POA: Diagnosis not present

## 2016-01-26 DIAGNOSIS — S80812S Abrasion, left lower leg, sequela: Secondary | ICD-10-CM | POA: Diagnosis not present

## 2016-01-26 DIAGNOSIS — K644 Residual hemorrhoidal skin tags: Secondary | ICD-10-CM | POA: Diagnosis not present

## 2016-01-26 DIAGNOSIS — N172 Acute kidney failure with medullary necrosis: Secondary | ICD-10-CM | POA: Diagnosis not present

## 2016-01-26 DIAGNOSIS — D500 Iron deficiency anemia secondary to blood loss (chronic): Secondary | ICD-10-CM | POA: Diagnosis not present

## 2016-01-26 DIAGNOSIS — Z8719 Personal history of other diseases of the digestive system: Secondary | ICD-10-CM | POA: Diagnosis not present

## 2016-01-26 DIAGNOSIS — R579 Shock, unspecified: Secondary | ICD-10-CM | POA: Diagnosis not present

## 2016-01-26 DIAGNOSIS — N183 Chronic kidney disease, stage 3 (moderate): Secondary | ICD-10-CM | POA: Diagnosis not present

## 2016-02-08 DIAGNOSIS — I8310 Varicose veins of unspecified lower extremity with inflammation: Secondary | ICD-10-CM | POA: Diagnosis not present

## 2016-02-10 DIAGNOSIS — N172 Acute kidney failure with medullary necrosis: Secondary | ICD-10-CM | POA: Diagnosis not present

## 2016-02-10 DIAGNOSIS — Z0000 Encounter for general adult medical examination without abnormal findings: Secondary | ICD-10-CM | POA: Diagnosis not present

## 2016-02-10 DIAGNOSIS — I119 Hypertensive heart disease without heart failure: Secondary | ICD-10-CM | POA: Diagnosis not present

## 2016-02-10 DIAGNOSIS — I8500 Esophageal varices without bleeding: Secondary | ICD-10-CM | POA: Diagnosis not present

## 2016-02-10 DIAGNOSIS — K766 Portal hypertension: Secondary | ICD-10-CM | POA: Diagnosis not present

## 2016-02-10 DIAGNOSIS — D500 Iron deficiency anemia secondary to blood loss (chronic): Secondary | ICD-10-CM | POA: Diagnosis not present

## 2016-02-10 DIAGNOSIS — R1032 Left lower quadrant pain: Secondary | ICD-10-CM | POA: Diagnosis not present

## 2016-02-10 DIAGNOSIS — K7460 Unspecified cirrhosis of liver: Secondary | ICD-10-CM | POA: Diagnosis not present

## 2016-02-10 DIAGNOSIS — R1084 Generalized abdominal pain: Secondary | ICD-10-CM | POA: Diagnosis not present

## 2016-03-02 DIAGNOSIS — D500 Iron deficiency anemia secondary to blood loss (chronic): Secondary | ICD-10-CM | POA: Diagnosis not present

## 2016-03-02 DIAGNOSIS — E119 Type 2 diabetes mellitus without complications: Secondary | ICD-10-CM | POA: Diagnosis not present

## 2016-03-02 DIAGNOSIS — I119 Hypertensive heart disease without heart failure: Secondary | ICD-10-CM | POA: Diagnosis not present

## 2016-03-02 DIAGNOSIS — K7460 Unspecified cirrhosis of liver: Secondary | ICD-10-CM | POA: Diagnosis not present

## 2016-03-02 DIAGNOSIS — R1084 Generalized abdominal pain: Secondary | ICD-10-CM | POA: Diagnosis not present

## 2016-03-02 DIAGNOSIS — E8881 Metabolic syndrome: Secondary | ICD-10-CM | POA: Diagnosis not present

## 2016-03-14 DIAGNOSIS — R1012 Left upper quadrant pain: Secondary | ICD-10-CM | POA: Diagnosis not present

## 2016-03-14 DIAGNOSIS — R1032 Left lower quadrant pain: Secondary | ICD-10-CM | POA: Diagnosis not present

## 2016-03-14 DIAGNOSIS — Z9884 Bariatric surgery status: Secondary | ICD-10-CM | POA: Diagnosis not present

## 2016-03-15 DIAGNOSIS — Z79899 Other long term (current) drug therapy: Secondary | ICD-10-CM | POA: Diagnosis not present

## 2016-03-15 DIAGNOSIS — R51 Headache: Secondary | ICD-10-CM | POA: Diagnosis not present

## 2016-03-15 DIAGNOSIS — K920 Hematemesis: Secondary | ICD-10-CM | POA: Diagnosis not present

## 2016-03-23 DIAGNOSIS — K922 Gastrointestinal hemorrhage, unspecified: Secondary | ICD-10-CM | POA: Diagnosis not present

## 2016-03-23 DIAGNOSIS — K7460 Unspecified cirrhosis of liver: Secondary | ICD-10-CM | POA: Diagnosis not present

## 2016-03-23 DIAGNOSIS — K7290 Hepatic failure, unspecified without coma: Secondary | ICD-10-CM | POA: Diagnosis not present

## 2016-03-23 DIAGNOSIS — I8500 Esophageal varices without bleeding: Secondary | ICD-10-CM | POA: Diagnosis not present

## 2016-04-30 DIAGNOSIS — N3001 Acute cystitis with hematuria: Secondary | ICD-10-CM | POA: Diagnosis not present

## 2016-04-30 DIAGNOSIS — R509 Fever, unspecified: Secondary | ICD-10-CM | POA: Diagnosis not present

## 2016-05-18 DIAGNOSIS — K7469 Other cirrhosis of liver: Secondary | ICD-10-CM | POA: Diagnosis not present

## 2016-05-18 DIAGNOSIS — R42 Dizziness and giddiness: Secondary | ICD-10-CM | POA: Diagnosis not present

## 2016-05-18 DIAGNOSIS — L299 Pruritus, unspecified: Secondary | ICD-10-CM | POA: Diagnosis not present

## 2016-05-18 DIAGNOSIS — E724 Disorders of ornithine metabolism: Secondary | ICD-10-CM | POA: Diagnosis not present

## 2016-05-18 DIAGNOSIS — E7220 Disorder of urea cycle metabolism, unspecified: Secondary | ICD-10-CM | POA: Diagnosis not present

## 2016-05-18 DIAGNOSIS — R0602 Shortness of breath: Secondary | ICD-10-CM | POA: Diagnosis not present

## 2016-05-22 DIAGNOSIS — Z6841 Body Mass Index (BMI) 40.0 and over, adult: Secondary | ICD-10-CM | POA: Diagnosis not present

## 2016-05-22 DIAGNOSIS — Z8639 Personal history of other endocrine, nutritional and metabolic disease: Secondary | ICD-10-CM | POA: Diagnosis not present

## 2016-05-22 DIAGNOSIS — K7460 Unspecified cirrhosis of liver: Secondary | ICD-10-CM | POA: Diagnosis not present

## 2016-05-22 DIAGNOSIS — K7581 Nonalcoholic steatohepatitis (NASH): Secondary | ICD-10-CM | POA: Diagnosis not present

## 2016-05-22 DIAGNOSIS — Z79899 Other long term (current) drug therapy: Secondary | ICD-10-CM | POA: Diagnosis not present

## 2016-05-31 DIAGNOSIS — G4733 Obstructive sleep apnea (adult) (pediatric): Secondary | ICD-10-CM | POA: Diagnosis not present

## 2016-05-31 DIAGNOSIS — K7290 Hepatic failure, unspecified without coma: Secondary | ICD-10-CM | POA: Diagnosis not present

## 2016-05-31 DIAGNOSIS — I8500 Esophageal varices without bleeding: Secondary | ICD-10-CM | POA: Diagnosis not present

## 2016-05-31 DIAGNOSIS — Z9884 Bariatric surgery status: Secondary | ICD-10-CM | POA: Diagnosis not present

## 2016-05-31 DIAGNOSIS — K2970 Gastritis, unspecified, without bleeding: Secondary | ICD-10-CM | POA: Diagnosis not present

## 2016-05-31 DIAGNOSIS — K7460 Unspecified cirrhosis of liver: Secondary | ICD-10-CM | POA: Diagnosis not present

## 2016-05-31 DIAGNOSIS — E119 Type 2 diabetes mellitus without complications: Secondary | ICD-10-CM | POA: Diagnosis not present

## 2016-05-31 DIAGNOSIS — K2900 Acute gastritis without bleeding: Secondary | ICD-10-CM | POA: Diagnosis not present

## 2016-05-31 DIAGNOSIS — J449 Chronic obstructive pulmonary disease, unspecified: Secondary | ICD-10-CM | POA: Diagnosis not present

## 2016-05-31 DIAGNOSIS — I8510 Secondary esophageal varices without bleeding: Secondary | ICD-10-CM | POA: Diagnosis not present

## 2016-05-31 DIAGNOSIS — K766 Portal hypertension: Secondary | ICD-10-CM | POA: Diagnosis not present

## 2016-05-31 DIAGNOSIS — I10 Essential (primary) hypertension: Secondary | ICD-10-CM | POA: Diagnosis not present

## 2016-06-13 DIAGNOSIS — K7581 Nonalcoholic steatohepatitis (NASH): Secondary | ICD-10-CM | POA: Diagnosis not present

## 2016-06-29 DIAGNOSIS — K7581 Nonalcoholic steatohepatitis (NASH): Secondary | ICD-10-CM | POA: Diagnosis not present

## 2016-07-02 DIAGNOSIS — K7460 Unspecified cirrhosis of liver: Secondary | ICD-10-CM | POA: Diagnosis not present

## 2016-07-16 DIAGNOSIS — N179 Acute kidney failure, unspecified: Secondary | ICD-10-CM | POA: Diagnosis not present

## 2016-07-16 DIAGNOSIS — N119 Chronic tubulo-interstitial nephritis, unspecified: Secondary | ICD-10-CM | POA: Diagnosis not present

## 2016-07-16 DIAGNOSIS — K7460 Unspecified cirrhosis of liver: Secondary | ICD-10-CM | POA: Diagnosis not present

## 2016-07-16 DIAGNOSIS — D696 Thrombocytopenia, unspecified: Secondary | ICD-10-CM | POA: Diagnosis not present

## 2016-07-16 DIAGNOSIS — K7210 Chronic hepatic failure without coma: Secondary | ICD-10-CM | POA: Diagnosis not present

## 2016-07-16 DIAGNOSIS — K529 Noninfective gastroenteritis and colitis, unspecified: Secondary | ICD-10-CM | POA: Diagnosis not present

## 2016-07-16 DIAGNOSIS — K652 Spontaneous bacterial peritonitis: Secondary | ICD-10-CM | POA: Diagnosis not present

## 2016-07-16 DIAGNOSIS — D649 Anemia, unspecified: Secondary | ICD-10-CM | POA: Diagnosis not present

## 2016-07-16 DIAGNOSIS — K7581 Nonalcoholic steatohepatitis (NASH): Secondary | ICD-10-CM | POA: Diagnosis not present

## 2016-07-16 DIAGNOSIS — Z6841 Body Mass Index (BMI) 40.0 and over, adult: Secondary | ICD-10-CM | POA: Diagnosis not present

## 2016-07-16 DIAGNOSIS — E876 Hypokalemia: Secondary | ICD-10-CM | POA: Diagnosis not present

## 2016-07-16 DIAGNOSIS — I119 Hypertensive heart disease without heart failure: Secondary | ICD-10-CM | POA: Diagnosis not present

## 2016-07-16 DIAGNOSIS — E875 Hyperkalemia: Secondary | ICD-10-CM | POA: Diagnosis not present

## 2016-07-16 DIAGNOSIS — N289 Disorder of kidney and ureter, unspecified: Secondary | ICD-10-CM | POA: Diagnosis not present

## 2016-07-16 DIAGNOSIS — N183 Chronic kidney disease, stage 3 (moderate): Secondary | ICD-10-CM | POA: Diagnosis not present

## 2016-07-16 DIAGNOSIS — E8881 Metabolic syndrome: Secondary | ICD-10-CM | POA: Diagnosis not present

## 2016-07-17 DIAGNOSIS — K7210 Chronic hepatic failure without coma: Secondary | ICD-10-CM | POA: Diagnosis not present

## 2016-07-17 DIAGNOSIS — D649 Anemia, unspecified: Secondary | ICD-10-CM | POA: Diagnosis not present

## 2016-07-17 DIAGNOSIS — K529 Noninfective gastroenteritis and colitis, unspecified: Secondary | ICD-10-CM | POA: Diagnosis not present

## 2016-07-17 DIAGNOSIS — K7581 Nonalcoholic steatohepatitis (NASH): Secondary | ICD-10-CM | POA: Diagnosis not present

## 2016-07-17 DIAGNOSIS — N289 Disorder of kidney and ureter, unspecified: Secondary | ICD-10-CM | POA: Diagnosis not present

## 2016-07-17 DIAGNOSIS — E875 Hyperkalemia: Secondary | ICD-10-CM | POA: Diagnosis not present

## 2016-07-17 DIAGNOSIS — D696 Thrombocytopenia, unspecified: Secondary | ICD-10-CM | POA: Diagnosis not present

## 2016-07-17 DIAGNOSIS — N179 Acute kidney failure, unspecified: Secondary | ICD-10-CM | POA: Diagnosis not present

## 2016-07-23 DIAGNOSIS — K766 Portal hypertension: Secondary | ICD-10-CM | POA: Diagnosis not present

## 2016-07-23 DIAGNOSIS — Z6841 Body Mass Index (BMI) 40.0 and over, adult: Secondary | ICD-10-CM | POA: Diagnosis not present

## 2016-07-23 DIAGNOSIS — K7460 Unspecified cirrhosis of liver: Secondary | ICD-10-CM | POA: Diagnosis not present

## 2016-07-23 DIAGNOSIS — J209 Acute bronchitis, unspecified: Secondary | ICD-10-CM | POA: Diagnosis not present

## 2016-07-23 DIAGNOSIS — I8500 Esophageal varices without bleeding: Secondary | ICD-10-CM | POA: Diagnosis not present

## 2016-07-23 DIAGNOSIS — M87059 Idiopathic aseptic necrosis of unspecified femur: Secondary | ICD-10-CM | POA: Diagnosis not present

## 2016-07-23 DIAGNOSIS — K921 Melena: Secondary | ICD-10-CM | POA: Diagnosis not present

## 2016-07-23 DIAGNOSIS — K529 Noninfective gastroenteritis and colitis, unspecified: Secondary | ICD-10-CM | POA: Diagnosis not present

## 2016-07-30 DIAGNOSIS — K625 Hemorrhage of anus and rectum: Secondary | ICD-10-CM | POA: Diagnosis not present

## 2016-07-30 DIAGNOSIS — E860 Dehydration: Secondary | ICD-10-CM | POA: Diagnosis not present

## 2016-07-30 DIAGNOSIS — R197 Diarrhea, unspecified: Secondary | ICD-10-CM | POA: Diagnosis not present

## 2016-07-30 DIAGNOSIS — N179 Acute kidney failure, unspecified: Secondary | ICD-10-CM | POA: Diagnosis not present

## 2016-07-31 DIAGNOSIS — K529 Noninfective gastroenteritis and colitis, unspecified: Secondary | ICD-10-CM | POA: Diagnosis not present

## 2016-07-31 DIAGNOSIS — Z6841 Body Mass Index (BMI) 40.0 and over, adult: Secondary | ICD-10-CM | POA: Diagnosis not present

## 2016-07-31 DIAGNOSIS — N170 Acute kidney failure with tubular necrosis: Secondary | ICD-10-CM | POA: Diagnosis not present

## 2016-07-31 DIAGNOSIS — E860 Dehydration: Secondary | ICD-10-CM | POA: Diagnosis not present

## 2016-07-31 DIAGNOSIS — K7581 Nonalcoholic steatohepatitis (NASH): Secondary | ICD-10-CM | POA: Diagnosis not present

## 2016-07-31 DIAGNOSIS — I10 Essential (primary) hypertension: Secondary | ICD-10-CM | POA: Diagnosis not present

## 2016-07-31 DIAGNOSIS — K7210 Chronic hepatic failure without coma: Secondary | ICD-10-CM | POA: Diagnosis not present

## 2016-07-31 DIAGNOSIS — N179 Acute kidney failure, unspecified: Secondary | ICD-10-CM | POA: Diagnosis not present

## 2016-07-31 DIAGNOSIS — K7460 Unspecified cirrhosis of liver: Secondary | ICD-10-CM | POA: Diagnosis not present

## 2016-07-31 DIAGNOSIS — K625 Hemorrhage of anus and rectum: Secondary | ICD-10-CM | POA: Diagnosis not present

## 2016-07-31 DIAGNOSIS — Z992 Dependence on renal dialysis: Secondary | ICD-10-CM | POA: Diagnosis not present

## 2016-07-31 DIAGNOSIS — R531 Weakness: Secondary | ICD-10-CM | POA: Diagnosis not present

## 2016-07-31 DIAGNOSIS — E441 Mild protein-calorie malnutrition: Secondary | ICD-10-CM | POA: Diagnosis not present

## 2016-07-31 DIAGNOSIS — E440 Moderate protein-calorie malnutrition: Secondary | ICD-10-CM | POA: Diagnosis not present

## 2016-07-31 DIAGNOSIS — D696 Thrombocytopenia, unspecified: Secondary | ICD-10-CM | POA: Diagnosis not present

## 2016-07-31 DIAGNOSIS — R197 Diarrhea, unspecified: Secondary | ICD-10-CM | POA: Diagnosis not present

## 2016-07-31 DIAGNOSIS — M1990 Unspecified osteoarthritis, unspecified site: Secondary | ICD-10-CM | POA: Diagnosis not present

## 2016-07-31 DIAGNOSIS — E872 Acidosis: Secondary | ICD-10-CM | POA: Diagnosis not present

## 2016-07-31 DIAGNOSIS — N189 Chronic kidney disease, unspecified: Secondary | ICD-10-CM | POA: Diagnosis not present

## 2016-07-31 DIAGNOSIS — D638 Anemia in other chronic diseases classified elsewhere: Secondary | ICD-10-CM | POA: Diagnosis not present

## 2016-08-01 DIAGNOSIS — E872 Acidosis: Secondary | ICD-10-CM

## 2016-08-01 DIAGNOSIS — K7581 Nonalcoholic steatohepatitis (NASH): Secondary | ICD-10-CM

## 2016-08-01 DIAGNOSIS — M1990 Unspecified osteoarthritis, unspecified site: Secondary | ICD-10-CM

## 2016-08-01 DIAGNOSIS — I10 Essential (primary) hypertension: Secondary | ICD-10-CM

## 2016-08-01 DIAGNOSIS — N179 Acute kidney failure, unspecified: Secondary | ICD-10-CM

## 2016-08-01 DIAGNOSIS — E440 Moderate protein-calorie malnutrition: Secondary | ICD-10-CM

## 2016-08-01 DIAGNOSIS — K7210 Chronic hepatic failure without coma: Secondary | ICD-10-CM

## 2016-08-02 ENCOUNTER — Inpatient Hospital Stay (HOSPITAL_COMMUNITY)
Admission: AD | Admit: 2016-08-02 | Discharge: 2016-08-27 | DRG: 673 | Disposition: A | Discharge status: Home Health | Payer: Medicare HMO | Source: Other Acute Inpatient Hospital | Attending: Internal Medicine | Admitting: Internal Medicine

## 2016-08-02 ENCOUNTER — Encounter (HOSPITAL_COMMUNITY): Payer: Self-pay | Admitting: Nephrology

## 2016-08-02 DIAGNOSIS — F419 Anxiety disorder, unspecified: Secondary | ICD-10-CM | POA: Diagnosis not present

## 2016-08-02 DIAGNOSIS — F329 Major depressive disorder, single episode, unspecified: Secondary | ICD-10-CM | POA: Diagnosis present

## 2016-08-02 DIAGNOSIS — R031 Nonspecific low blood-pressure reading: Secondary | ICD-10-CM | POA: Diagnosis not present

## 2016-08-02 DIAGNOSIS — K7210 Chronic hepatic failure without coma: Secondary | ICD-10-CM | POA: Diagnosis not present

## 2016-08-02 DIAGNOSIS — K529 Noninfective gastroenteritis and colitis, unspecified: Secondary | ICD-10-CM | POA: Diagnosis present

## 2016-08-02 DIAGNOSIS — Z87891 Personal history of nicotine dependence: Secondary | ICD-10-CM

## 2016-08-02 DIAGNOSIS — N183 Chronic kidney disease, stage 3 (moderate): Secondary | ICD-10-CM | POA: Diagnosis not present

## 2016-08-02 DIAGNOSIS — J9690 Respiratory failure, unspecified, unspecified whether with hypoxia or hypercapnia: Secondary | ICD-10-CM

## 2016-08-02 DIAGNOSIS — N200 Calculus of kidney: Secondary | ICD-10-CM | POA: Diagnosis not present

## 2016-08-02 DIAGNOSIS — Z9884 Bariatric surgery status: Secondary | ICD-10-CM

## 2016-08-02 DIAGNOSIS — C3490 Malignant neoplasm of unspecified part of unspecified bronchus or lung: Secondary | ICD-10-CM | POA: Diagnosis not present

## 2016-08-02 DIAGNOSIS — I130 Hypertensive heart and chronic kidney disease with heart failure and stage 1 through stage 4 chronic kidney disease, or unspecified chronic kidney disease: Secondary | ICD-10-CM | POA: Diagnosis present

## 2016-08-02 DIAGNOSIS — K7581 Nonalcoholic steatohepatitis (NASH): Secondary | ICD-10-CM | POA: Diagnosis not present

## 2016-08-02 DIAGNOSIS — R188 Other ascites: Secondary | ICD-10-CM | POA: Diagnosis not present

## 2016-08-02 DIAGNOSIS — W19XXXA Unspecified fall, initial encounter: Secondary | ICD-10-CM

## 2016-08-02 DIAGNOSIS — I959 Hypotension, unspecified: Secondary | ICD-10-CM

## 2016-08-02 DIAGNOSIS — D6959 Other secondary thrombocytopenia: Secondary | ICD-10-CM | POA: Diagnosis present

## 2016-08-02 DIAGNOSIS — N170 Acute kidney failure with tubular necrosis: Principal | ICD-10-CM | POA: Diagnosis present

## 2016-08-02 DIAGNOSIS — D684 Acquired coagulation factor deficiency: Secondary | ICD-10-CM | POA: Diagnosis present

## 2016-08-02 DIAGNOSIS — E1122 Type 2 diabetes mellitus with diabetic chronic kidney disease: Secondary | ICD-10-CM | POA: Diagnosis present

## 2016-08-02 DIAGNOSIS — E872 Acidosis: Secondary | ICD-10-CM | POA: Diagnosis present

## 2016-08-02 DIAGNOSIS — G4700 Insomnia, unspecified: Secondary | ICD-10-CM | POA: Diagnosis not present

## 2016-08-02 DIAGNOSIS — J449 Chronic obstructive pulmonary disease, unspecified: Secondary | ICD-10-CM | POA: Diagnosis not present

## 2016-08-02 DIAGNOSIS — M1990 Unspecified osteoarthritis, unspecified site: Secondary | ICD-10-CM | POA: Diagnosis not present

## 2016-08-02 DIAGNOSIS — Z452 Encounter for adjustment and management of vascular access device: Secondary | ICD-10-CM

## 2016-08-02 DIAGNOSIS — Z5111 Encounter for antineoplastic chemotherapy: Secondary | ICD-10-CM | POA: Diagnosis not present

## 2016-08-02 DIAGNOSIS — N184 Chronic kidney disease, stage 4 (severe): Secondary | ICD-10-CM | POA: Diagnosis not present

## 2016-08-02 DIAGNOSIS — G4733 Obstructive sleep apnea (adult) (pediatric): Secondary | ICD-10-CM | POA: Diagnosis present

## 2016-08-02 DIAGNOSIS — K625 Hemorrhage of anus and rectum: Secondary | ICD-10-CM | POA: Diagnosis present

## 2016-08-02 DIAGNOSIS — N189 Chronic kidney disease, unspecified: Secondary | ICD-10-CM | POA: Diagnosis not present

## 2016-08-02 DIAGNOSIS — K5730 Diverticulosis of large intestine without perforation or abscess without bleeding: Secondary | ICD-10-CM | POA: Diagnosis not present

## 2016-08-02 DIAGNOSIS — R5381 Other malaise: Secondary | ICD-10-CM | POA: Diagnosis present

## 2016-08-02 DIAGNOSIS — E46 Unspecified protein-calorie malnutrition: Secondary | ICD-10-CM | POA: Diagnosis not present

## 2016-08-02 DIAGNOSIS — I10 Essential (primary) hypertension: Secondary | ICD-10-CM | POA: Diagnosis not present

## 2016-08-02 DIAGNOSIS — K7469 Other cirrhosis of liver: Secondary | ICD-10-CM | POA: Diagnosis present

## 2016-08-02 DIAGNOSIS — K7460 Unspecified cirrhosis of liver: Secondary | ICD-10-CM | POA: Diagnosis present

## 2016-08-02 DIAGNOSIS — E869 Volume depletion, unspecified: Secondary | ICD-10-CM | POA: Diagnosis not present

## 2016-08-02 DIAGNOSIS — R578 Other shock: Secondary | ICD-10-CM | POA: Diagnosis not present

## 2016-08-02 DIAGNOSIS — Z79899 Other long term (current) drug therapy: Secondary | ICD-10-CM

## 2016-08-02 DIAGNOSIS — D696 Thrombocytopenia, unspecified: Secondary | ICD-10-CM | POA: Diagnosis not present

## 2016-08-02 DIAGNOSIS — N19 Unspecified kidney failure: Secondary | ICD-10-CM | POA: Diagnosis not present

## 2016-08-02 DIAGNOSIS — L299 Pruritus, unspecified: Secondary | ICD-10-CM | POA: Diagnosis not present

## 2016-08-02 DIAGNOSIS — Z4901 Encounter for fitting and adjustment of extracorporeal dialysis catheter: Secondary | ICD-10-CM | POA: Diagnosis not present

## 2016-08-02 DIAGNOSIS — N3289 Other specified disorders of bladder: Secondary | ICD-10-CM | POA: Diagnosis not present

## 2016-08-02 DIAGNOSIS — K649 Unspecified hemorrhoids: Secondary | ICD-10-CM | POA: Diagnosis not present

## 2016-08-02 DIAGNOSIS — D62 Acute posthemorrhagic anemia: Secondary | ICD-10-CM | POA: Diagnosis present

## 2016-08-02 DIAGNOSIS — F32A Depression, unspecified: Secondary | ICD-10-CM | POA: Diagnosis present

## 2016-08-02 DIAGNOSIS — E440 Moderate protein-calorie malnutrition: Secondary | ICD-10-CM | POA: Diagnosis not present

## 2016-08-02 DIAGNOSIS — Z992 Dependence on renal dialysis: Secondary | ICD-10-CM | POA: Diagnosis not present

## 2016-08-02 DIAGNOSIS — N179 Acute kidney failure, unspecified: Secondary | ICD-10-CM | POA: Diagnosis not present

## 2016-08-02 DIAGNOSIS — Z6841 Body Mass Index (BMI) 40.0 and over, adult: Secondary | ICD-10-CM

## 2016-08-02 DIAGNOSIS — D61818 Other pancytopenia: Secondary | ICD-10-CM | POA: Diagnosis not present

## 2016-08-02 DIAGNOSIS — I509 Heart failure, unspecified: Secondary | ICD-10-CM | POA: Diagnosis not present

## 2016-08-02 DIAGNOSIS — S8992XA Unspecified injury of left lower leg, initial encounter: Secondary | ICD-10-CM | POA: Diagnosis not present

## 2016-08-02 DIAGNOSIS — N2581 Secondary hyperparathyroidism of renal origin: Secondary | ICD-10-CM | POA: Diagnosis present

## 2016-08-02 DIAGNOSIS — R69 Illness, unspecified: Secondary | ICD-10-CM | POA: Diagnosis not present

## 2016-08-02 DIAGNOSIS — S8991XA Unspecified injury of right lower leg, initial encounter: Secondary | ICD-10-CM | POA: Diagnosis not present

## 2016-08-02 HISTORY — DX: Chronic kidney disease, stage 4 (severe): N18.4

## 2016-08-02 HISTORY — DX: Essential (primary) hypertension: I10

## 2016-08-02 HISTORY — DX: Calculus of kidney: N20.0

## 2016-08-02 HISTORY — DX: Morbid (severe) obesity due to excess calories: E66.01

## 2016-08-02 HISTORY — DX: Major depressive disorder, single episode, unspecified: F32.9

## 2016-08-02 HISTORY — DX: Thrombocytopenia, unspecified: D69.6

## 2016-08-02 HISTORY — DX: Chronic obstructive pulmonary disease, unspecified: J44.9

## 2016-08-02 HISTORY — DX: Depression, unspecified: F32.A

## 2016-08-02 LAB — CBC
HCT: 22.5 % — ABNORMAL LOW (ref 36.0–46.0)
HEMOGLOBIN: 7.7 g/dL — AB (ref 12.0–15.0)
MCH: 30.9 pg (ref 26.0–34.0)
MCHC: 34.2 g/dL (ref 30.0–36.0)
MCV: 90.4 fL (ref 78.0–100.0)
PLATELETS: 94 10*3/uL — AB (ref 150–400)
RBC: 2.49 MIL/uL — AB (ref 3.87–5.11)
RDW: 17.3 % — ABNORMAL HIGH (ref 11.5–15.5)
WBC: 5.5 10*3/uL (ref 4.0–10.5)

## 2016-08-02 LAB — COMPREHENSIVE METABOLIC PANEL
ALBUMIN: 2.4 g/dL — AB (ref 3.5–5.0)
ALT: 12 U/L — AB (ref 14–54)
ANION GAP: 9 (ref 5–15)
AST: 31 U/L (ref 15–41)
Alkaline Phosphatase: 83 U/L (ref 38–126)
BUN: 57 mg/dL — ABNORMAL HIGH (ref 6–20)
CHLORIDE: 110 mmol/L (ref 101–111)
CO2: 16 mmol/L — AB (ref 22–32)
CREATININE: 9.61 mg/dL — AB (ref 0.44–1.00)
Calcium: 7.7 mg/dL — ABNORMAL LOW (ref 8.9–10.3)
GFR calc non Af Amer: 4 mL/min — ABNORMAL LOW (ref >60)
GFR, EST AFRICAN AMERICAN: 5 mL/min — AB (ref >60)
GLUCOSE: 95 mg/dL (ref 65–99)
Potassium: 4.5 mmol/L (ref 3.5–5.1)
SODIUM: 135 mmol/L (ref 135–145)
Total Bilirubin: 1.2 mg/dL (ref 0.3–1.2)
Total Protein: 5.4 g/dL — ABNORMAL LOW (ref 6.5–8.1)

## 2016-08-02 MED ORDER — METRONIDAZOLE IN NACL 5-0.79 MG/ML-% IV SOLN
500.0000 mg | Freq: Three times a day (TID) | INTRAVENOUS | Status: DC
  Administered 2016-08-02 – 2016-08-05 (×8): 500 mg via INTRAVENOUS
  Filled 2016-08-02 (×8): qty 100

## 2016-08-02 MED ORDER — SODIUM CHLORIDE 0.9 % IV SOLN
INTRAVENOUS | Status: DC
  Administered 2016-08-02 – 2016-08-03 (×2): via INTRAVENOUS

## 2016-08-02 MED ORDER — RIFAXIMIN 550 MG PO TABS
550.0000 mg | ORAL_TABLET | Freq: Two times a day (BID) | ORAL | Status: DC
  Administered 2016-08-02 – 2016-08-27 (×51): 550 mg via ORAL
  Filled 2016-08-02 (×53): qty 1

## 2016-08-02 MED ORDER — LACTULOSE 10 GM/15ML PO SOLN
20.0000 g | Freq: Two times a day (BID) | ORAL | Status: DC
  Administered 2016-08-02 – 2016-08-05 (×7): 20 g via ORAL
  Filled 2016-08-02 (×7): qty 30

## 2016-08-02 MED ORDER — FUROSEMIDE 10 MG/ML IJ SOLN
80.0000 mg | Freq: Two times a day (BID) | INTRAMUSCULAR | Status: DC
  Administered 2016-08-03: 80 mg via INTRAVENOUS
  Filled 2016-08-02 (×2): qty 8

## 2016-08-02 MED ORDER — HEPARIN SODIUM (PORCINE) 5000 UNIT/ML IJ SOLN
5000.0000 [IU] | Freq: Three times a day (TID) | INTRAMUSCULAR | Status: DC
  Administered 2016-08-02 – 2016-08-17 (×42): 5000 [IU] via SUBCUTANEOUS
  Filled 2016-08-02 (×42): qty 1

## 2016-08-02 MED ORDER — SODIUM CHLORIDE 0.9% FLUSH
3.0000 mL | Freq: Two times a day (BID) | INTRAVENOUS | Status: DC
  Administered 2016-08-02 – 2016-08-11 (×13): 3 mL via INTRAVENOUS

## 2016-08-02 MED ORDER — VENLAFAXINE HCL ER 75 MG PO CP24
75.0000 mg | ORAL_CAPSULE | Freq: Every day | ORAL | Status: DC
  Administered 2016-08-03 – 2016-08-27 (×26): 75 mg via ORAL
  Filled 2016-08-02 (×26): qty 1

## 2016-08-02 MED ORDER — NADOLOL 20 MG PO TABS
20.0000 mg | ORAL_TABLET | Freq: Every evening | ORAL | Status: DC
  Administered 2016-08-03: 20 mg via ORAL
  Filled 2016-08-02 (×2): qty 1

## 2016-08-02 MED ORDER — CIPROFLOXACIN IN D5W 400 MG/200ML IV SOLN: 400.0000 mg | Freq: Two times a day (BID) | INTRAVENOUS | Status: DC

## 2016-08-02 MED ORDER — ALBUMIN HUMAN 5 % IV SOLN
12.5000 g | Freq: Once | INTRAVENOUS | Status: AC
  Administered 2016-08-02: 12.5 g via INTRAVENOUS
  Filled 2016-08-02: qty 250

## 2016-08-02 MED ORDER — CIPROFLOXACIN IN D5W 400 MG/200ML IV SOLN
400.0000 mg | INTRAVENOUS | Status: DC
  Administered 2016-08-02 – 2016-08-04 (×3): 400 mg via INTRAVENOUS
  Filled 2016-08-02 (×3): qty 200

## 2016-08-02 NOTE — Consult Note (Addendum)
Reason for Consult: AKI/CKD Referring Physician:  Wendee Beavers, MD  Loretta [name] is an 61 y.o. female.  HPI: Loretta [name] has a PMH significant for advanced non-alcoholic cirrhosis, HTN, COPD, morbid obesity, depression, CKD stage 3-4, and recent admission to [facility] with colitis, who presented back to [place] on 07/31/16 with hematochezia and upon evaluation was noted to have AKI/CKD with BUN/Cr of 54/9.  This was felt to be due to ABLA as well as intravascular volume depletion after having zaroxolyn added to her outpatient diuretic regimen.  She was given IVF's, however her Scr did not significantly improve and continued to range from 8.9 to 9.1 today and was transferred to [facility] for further evaluation and management of her AKI.  She was to be transferred to [facility] since she is undergoing liver transplant evaluation there, however they did not have a bed available and with the lack of improvement in her renal function she was transferred here so we might see her in consultation for her AKI.  The trend in Scr is seen below.  A renal US performed at [facility] was notable for Right Kidney of 8.1 cm with increased cortical echogenicity Left Kidney 10.2 cm, with normal echogenicity.  No evidence of renal mass or obstruction.   Trend in Creatinine: Creatinine, Ser  Date/Time Value Ref Range Status  08/02/2016 9.1 (H) 0.52 - 1.04 mg/dL Final  08/01/2016 8.9 (H) 0.52 - 1.04 mg/dL Final  07/31/2016 9.0 (H) 0.52 - 1.04 mg/dL Final  01/21/2016 05:37 AM 1.85 (H) 0.44 - 1.00 mg/dL Final  01/20/2016 08:19 AM 1.96 (H) 0.44 - 1.00 mg/dL Final  01/19/2016 07:51 AM 2.41 (H) 0.44 - 1.00 mg/dL Final  01/18/2016 04:14 AM 2.75 (H) 0.44 - 1.00 mg/dL Final  01/17/2016 04:24 PM 2.77 (H) 0.44 - 1.00 mg/dL Final  01/17/2016 04:51 AM 2.62 (H) 0.44 - 1.00 mg/dL Final  01/16/2016 11:47 PM 2.72 (H) 0.44 - 1.00 mg/dL Final  01/16/2016 06:59 PM 2.76 (H) 0.44 - 1.00 mg/dL Final  01/16/2016 01:57 PM 2.70 (H) 0.44 - 1.00 mg/dL  Final  01/16/2016 10:28 AM 2.64 (H) 0.44 - 1.00 mg/dL Final  01/16/2016 12:02 AM 2.55 (H) 0.44 - 1.00 mg/dL Final    PMH:   Past Medical History:  Diagnosis Date  . CKD (chronic kidney disease) stage 4, GFR 15-29 ml/min (HCC) 01/2016  . COPD (chronic obstructive pulmonary disease) ([place])   . Depression   . Hypertension   . Liver cirrhosis secondary to NASH ([place])   . Morbid obesity ([place])   . Nephrolithiasis   . Thrombocytopenia (HCC)     PSH:   Past Surgical History:  Procedure Laterality Date  . CHOLECYSTECTOMY    . ESOPHAGOGASTRODUODENOSCOPY N/A 01/16/2016   Procedure: ESOPHAGOGASTRODUODENOSCOPY (EGD);  Surgeon: Clarene Essex, MD;  Location: [facility] ENDOSCOPY;  Service: Endoscopy;  Laterality: N/A;  . GASTRIC BYPASS      Allergies:  Allergies  Allergen Reactions  . Codeine   . Cymbalta [Duloxetine Hcl]   . Levaquin [Levofloxacin In D5w]   . Tomato Itching    Medications:   Prior to Admission medications   Medication Sig Start Date End Date Taking? Authorizing Provider  docusate sodium (COLACE) 100 MG capsule Take 100 mg by mouth at bedtime.    Historical Provider, MD  folic acid (FOLVITE) 1 MG tablet Take 1 mg by mouth daily.    Historical Provider, MD  furosemide (LASIX) 20 MG tablet Take 1 tablet (20 mg total) by mouth 2 (two) times daily. Restart  in 5-6days after FU with PCP 01/21/16   Domenic Polite, MD  KRISTALOSE 20 g packet Take 20 g by mouth 2 (two) times daily.  11/14/15   Historical Provider, MD  Melatonin 3 MG TABS Take 3 mg by mouth at bedtime.    Historical Provider, MD  milk thistle 175 MG tablet Take 175 mg by mouth 2 (two) times daily.    Historical Provider, MD  Multiple Vitamin (MULTI-VITAMIN PO) Take 1 tablet by mouth daily.    Historical Provider, MD  nadolol (CORGARD) 20 MG tablet Take 20 mg by mouth every evening.  12/23/15   Historical Provider, MD  pantoprazole (PROTONIX) 40 MG tablet Take 1 tablet (40 mg total) by mouth 2 (two) times daily before a meal.  01/21/16   Domenic Polite, MD  pramipexole (MIRAPEX) 0.25 MG tablet Take 0.25 mg by mouth 2 (two) times daily. 11/18/15   Historical Provider, MD  Probiotic Product (PROBIOTIC PO) Take 1 tablet by mouth daily.    Historical Provider, MD  spironolactone (ALDACTONE) 50 MG tablet Take 1 tablet (50 mg total) by mouth daily. Restart in 5-6days after FU with PCP 01/21/16   Domenic Polite, MD  venlafaxine (EFFEXOR) 25 MG tablet Take 75 mg by mouth daily.    Historical Provider, MD  Vitamin D, Ergocalciferol, (DRISDOL) 50000 units CAPS capsule Take 50,000 Units by mouth 2 (two) times a week. Tues / Thurs    Historical Provider, MD  XIFAXAN 550 MG TABS tablet Take 550 mg by mouth 2 (two) times daily. 12/23/15   Historical Provider, MD    Inpatient medications: . ciprofloxacin  400 mg Intravenous Q12H  . heparin  5,000 Units Subcutaneous Q8H  . lactulose  20 g Oral BID  . metronidazole  500 mg Intravenous Q8H  . [START ON 08/03/2016] nadolol  20 mg Oral QPM  . rifaximin  550 mg Oral BID  . sodium chloride flush  3 mL Intravenous Q12H  . venlafaxine  75 mg Oral Daily    Discontinued Meds:  There are no discontinued medications.  Social History:  reports that she has quit smoking. She has never used smokeless tobacco. She reports that she does not drink alcohol or use drugs.  Family History:   Family History  Problem Relation Age of Onset  . Throat cancer Father   . Lung cancer Brother     Pertinent items are noted in HPI. Weight change:   Intake/Output Summary (Last 24 hours) at 08/02/16 1937 Last data filed at 08/02/16 1830  Gross per 24 hour  Intake              220 ml  Output                0 ml  Net              220 ml   BP (!) 107/43 (BP Location: Left Arm) Comment: rn notify  Pulse 66   Temp 97.8 F (36.6 C) (Oral)   Resp 18   Ht '5\' 4"'$  (1.626 m)   SpO2 100%  Vitals:   08/02/16 1720  BP: (!) 107/43  Pulse: 66  Resp: 18  Temp: 97.8 F (36.6 C)  TempSrc: Oral  SpO2: 100%   Height: '5\' 4"'$  (1.626 m)     General appearance: alert, cooperative, morbidly obese and marked anasarca Head: Normocephalic, without obvious abnormality, atraumatic Eyes: negative findings: lids and lashes normal, conjunctivae and sclerae normal and corneas clear Resp: diminished breath  sounds bilaterally Cardio: faint heart sounds, no rub, regular rate and rhythm GI: obese, +BS, +fluid wave Extremities: anasarca to neck  Labs: Basic Metabolic Panel: No results for input(s): NA, K, CL, CO2, GLUCOSE, BUN, CREATININE, ALBUMIN, CALCIUM, PHOS in the last 168 hours. Liver Function Tests: No results for input(s): AST, ALT, ALKPHOS, BILITOT, PROT, ALBUMIN in the last 168 hours. No results for input(s): LIPASE, AMYLASE in the last 168 hours. No results for input(s): AMMONIA in the last 168 hours. CBC:  Recent Labs Lab 08/02/16 1900  WBC 5.5  HGB 7.7*  HCT 22.5*  MCV 90.4  PLT PENDING   PT/INR: '@LABRCNTIP'$ (inr:5) Cardiac Enzymes: )No results for input(s): CKTOTAL, CKMB, CKMBINDEX, TROPONINI in the last 168 hours. CBG: No results for input(s): GLUCAP in the last 168 hours.  Iron Studies: No results for input(s): IRON, TIBC, TRANSFERRIN, FERRITIN in the last 168 hours.  Xrays/Other Studies: No results found.   Assessment/Plan: 1.  AKI/CKD- multiple renal insults with cirrhosis, intravascular volume depletion, and ABLA with hypotension.  Hopefully this is just ischemic ATN and not hepatorenal syndrome.  Pt is a poor long-term dialysis candidate and is currently not a candidate for liver transplant due to her morbid obesity.  Will try IV lasix and albumin to help mobilize fluid and follow renal function closely.  Recommend palliative care consult to help set goals and limits of care.  May need to start octreotide and midodrine as well. 2. Cirrhosis (NASH)- as above.  Continue with lactulose.  Marked anasarca and will try albumin and lasix as above. 3. ABLA- hematochezia related to  colitis- may need blood transfusion if continues to drop 4. CKD stage 3-4- unclear what her baseline Scr has been since September.  She denies being told that she has CKD.  Renal US showed atrophic right kidney.  She denies any knowledge of injury or event to explain this but did have atherosclerosis of her abdominal aorta on CT scan and may have renal artery stenosis.  Need to contact [facility] to get baseline Scr.  5. HTN- low, hold nadalol 6. Morbid obesity- s/p gastric bypass surgery in 2010 7. Protein malnutrition- 8. 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 Disposition- poor overall prognosis and recommend Palliative Care consult to help set goals/limits of care   Donetta Potts 08/02/2016, 7:37 PM

## 2016-08-02 NOTE — H&P (Signed)
History and Physical    Loretta [name] [id number] DOB: 08/12/1955 DOA: 08/02/2016  PCP: Cyndy Freeze, MD  Patient coming from: [place]  Chief Complaint: ARF  HPI: Loretta [name] is a 61 y.o. female with medical history significant of liver cirrhosis secondary to NASH, history of edema treated with Lasix. Presenting today after patient was found to have elevated serum creatinine level at her primary care's office. Patient was recently treated for colitis of which she was sent home with Cipro and Flagyl. She completed 12 out of the 14 days of treatment. She still has some abdominal discomfort. Otherwise patient reports no new complaints. Patient denies any fevers   Review of Systems: As per HPI otherwise 10 point review of systems negative.    Past Medical History:  Diagnosis Date  . Liver cirrhosis secondary to NASH [facility])     Past Surgical History:  Procedure Laterality Date  . CHOLECYSTECTOMY    . ESOPHAGOGASTRODUODENOSCOPY N/A 01/16/2016   Procedure: ESOPHAGOGASTRODUODENOSCOPY (EGD);  Surgeon: Clarene Essex, MD;  Location: [facility] ENDOSCOPY;  Service: Endoscopy;  Laterality: N/A;  . GASTRIC BYPASS       reports that she has quit smoking. She has never used smokeless tobacco. She reports that she does not drink alcohol or use drugs.  Allergies  Allergen Reactions  . Codeine   . Cymbalta [Duloxetine Hcl]   . Levaquin [Levofloxacin In D5w]   . Tomato Itching    Family History  Problem Relation Age of Onset  . Throat cancer Father   . Lung cancer Brother     Prior to Admission medications   Medication Sig Start Date End Date Taking? Authorizing Provider  docusate sodium (COLACE) 100 MG capsule Take 100 mg by mouth at bedtime.    Historical Provider, MD  folic acid (FOLVITE) 1 MG tablet Take 1 mg by mouth daily.    Historical Provider, MD  furosemide (LASIX) 20 MG tablet Take 1 tablet (20 mg total) by mouth 2 (two) times daily. Restart in 5-6days after FU with PCP 01/21/16    Domenic Polite, MD  KRISTALOSE 20 g packet Take 20 g by mouth 2 (two) times daily.  11/14/15   Historical Provider, MD  Melatonin 3 MG TABS Take 3 mg by mouth at bedtime.    Historical Provider, MD  milk thistle 175 MG tablet Take 175 mg by mouth 2 (two) times daily.    Historical Provider, MD  Multiple Vitamin (MULTI-VITAMIN PO) Take 1 tablet by mouth daily.    Historical Provider, MD  nadolol (CORGARD) 20 MG tablet Take 20 mg by mouth every evening.  12/23/15   Historical Provider, MD  pantoprazole (PROTONIX) 40 MG tablet Take 1 tablet (40 mg total) by mouth 2 (two) times daily before a meal. 01/21/16   Domenic Polite, MD  pramipexole (MIRAPEX) 0.25 MG tablet Take 0.25 mg by mouth 2 (two) times daily. 11/18/15   Historical Provider, MD  Probiotic Product (PROBIOTIC PO) Take 1 tablet by mouth daily.    Historical Provider, MD  spironolactone (ALDACTONE) 50 MG tablet Take 1 tablet (50 mg total) by mouth daily. Restart in 5-6days after FU with PCP 01/21/16   Domenic Polite, MD  venlafaxine (EFFEXOR) 25 MG tablet Take 75 mg by mouth daily.    Historical Provider, MD  Vitamin D, Ergocalciferol, (DRISDOL) 50000 units CAPS capsule Take 50,000 Units by mouth 2 (two) times a week. Tues / Thurs    Historical Provider, MD  XIFAXAN 550 MG TABS tablet Take 550 mg by  mouth 2 (two) times daily. 12/23/15   Historical Provider, MD    Physical Exam: Vitals:   08/02/16 1720  BP: (!) 107/43  Pulse: 66  Resp: 18  Temp: 97.8 F (36.6 C)  TempSrc: Oral  SpO2: 100%  Height: '5\' 4"'$  (1.626 m)    Constitutional: NAD, calm, comfortable Vitals:   08/02/16 1720  BP: (!) 107/43  Pulse: 66  Resp: 18  Temp: 97.8 F (36.6 C)  TempSrc: Oral  SpO2: 100%  Height: '5\' 4"'$  (1.626 m)   Eyes: PERRL, lids and conjunctivae normal ENMT: Mucous membranes are Dry. Posterior pharynx clear of any exudate or lesions. Neck: normal, supple, no masses, no thyromegaly Respiratory: clear to auscultation bilaterally, no wheezing, no  crackles. Normal respiratory effort. No accessory muscle use.  Cardiovascular: Regular rate and rhythm, + systolic murmur no / rubs / gallops. No extremity edema. 2+ pedal pulses. No carotid bruits.  Abdomen: no tenderness, no masses palpated. No hepatosplenomegaly. Bowel sounds positive. Difficult exam secondary to obesity Musculoskeletal: no clubbing / cyanosis. No joint deformity upper and lower extremities.  Skin: no rashes, lesions, ulcers. No induration Neurologic: CN 2-12 grossly intact. Sensation intact, DTR normal. Strength 5/5 in all 4.  Psychiatric:  Alert and oriented x 3. Normal mood.    Labs on Admission: I have personally reviewed following labs and imaging studies  CBC: No results for input(s): WBC, NEUTROABS, HGB, HCT, MCV, PLT in the last 168 hours. Basic Metabolic Panel: No results for input(s): NA, K, CL, CO2, GLUCOSE, BUN, CREATININE, CALCIUM, MG, PHOS in the last 168 hours. GFR: CrCl cannot be calculated (Patient's most recent lab result is older than the maximum 21 days allowed.). Liver Function Tests: No results for input(s): AST, ALT, ALKPHOS, BILITOT, PROT, ALBUMIN in the last 168 hours. No results for input(s): LIPASE, AMYLASE in the last 168 hours. No results for input(s): AMMONIA in the last 168 hours. Coagulation Profile: No results for input(s): INR, PROTIME in the last 168 hours. Cardiac Enzymes: No results for input(s): CKTOTAL, CKMB, CKMBINDEX, TROPONINI in the last 168 hours. BNP (last 3 results) No results for input(s): PROBNP in the last 8760 hours. HbA1C: No results for input(s): HGBA1C in the last 72 hours. CBG: No results for input(s): GLUCAP in the last 168 hours. Lipid Profile: No results for input(s): CHOL, HDL, LDLCALC, TRIG, CHOLHDL, LDLDIRECT in the last 72 hours. Thyroid Function Tests: No results for input(s): TSH, T4TOTAL, FREET4, T3FREE, THYROIDAB in the last 72 hours. Anemia Panel: No results for input(s): VITAMINB12, FOLATE,  FERRITIN, TIBC, IRON, RETICCTPCT in the last 72 hours. Urine analysis: No results found for: COLORURINE, APPEARANCEUR, LABSPEC, PHURINE, GLUCOSEU, HGBUR, BILIRUBINUR, KETONESUR, PROTEINUR, UROBILINOGEN, NITRITE, LEUKOCYTESUR  Radiological Exams on Admission: No results found.   Assessment/Plan Active Problems:     ARF (acute renal failure) (HCC) - Most likely prerenal etiology. Will provide gentle fluid hydration and reassess. Nephrology consulted and very much appreciate their help in this case. - Suspect serum creatinine at baseline anywhere from 1.9-2.4  Liver cirrhosis secondary to NASH ([place])  - We'll continue rifaximin main and lactulose. - Assess ammonia levels - CMP ordered    Colitis - Still an ongoing problem. Patient still complaining of some abdominal discomfort. Of note patient reports taking medication for 12 days. We'll continue Cipro Flagyl while in house   DVT prophylaxis: heparin Code Status: Full Family Communication: Discussed with spouse at bedside Disposition Plan: Pending further workup. Telemetry Consults called: Nephrology Admission status: Inpatient admission  Velvet Bathe MD [facility] Pager [phone]  If 7PM-7AM, please contact night-coverage [email or web address] Password [facility]  08/02/2016, 6:39 PM

## 2016-08-02 NOTE — Progress Notes (Signed)
Pharmacy Antibiotic Note  Loretta [name] is a 61 y.o. female admitted on 08/02/2016 with intra-abdominal infection.  Pharmacy has been consulted for cipro dosing.  Medical history significant of liver cirrhosis secondary to NASH, history of edema. She is being admitted after noted to have elevated Scr at primary care appointment. She is currently on day 12/14 of cipro/flagyl for colitis.    Plan: Continue cipro '400mg'$  iv bid for now - change to po when able Flagyl 500 mg tid  Height: '5\' 4"'$  (162.6 cm) IBW/kg (Calculated) : 54.7  Temp (24hrs), Avg:97.8 F (36.6 C), Min:97.8 F (36.6 C), Max:97.8 F (36.6 C)  No results for input(s): WBC, CREATININE, LATICACIDVEN, VANCOTROUGH, VANCOPEAK, VANCORANDOM, GENTTROUGH, GENTPEAK, GENTRANDOM, TOBRATROUGH, TOBRAPEAK, TOBRARND, AMIKACINPEAK, AMIKACINTROU, AMIKACIN in the last 168 hours.  CrCl cannot be calculated (Patient's most recent lab result is older than the maximum 21 days allowed.).    Allergies  Allergen Reactions  . Codeine   . Cymbalta [Duloxetine Hcl]   . Levaquin [Levofloxacin In D5w]   . Tomato Itching   Erin Hearing PharmD., BCPS Clinical Pharmacist Pager [phone] 08/02/2016 6:57 PM

## 2016-08-02 NOTE — Progress Notes (Signed)
Admitted patient from [facility] ,placed in telemetry bed,called and verified.Awake,alert and oriented.Fall prevention education rendered.Multiple skin bruised noted on her bilateral upper extremities.Red bruised on patient's right inner and lateral leg,otherwise no skin breakdown observed.

## 2016-08-03 LAB — COMPREHENSIVE METABOLIC PANEL
ALK PHOS: 80 U/L (ref 38–126)
ALT: 14 U/L (ref 14–54)
ANION GAP: 11 (ref 5–15)
AST: 29 U/L (ref 15–41)
Albumin: 2.6 g/dL — ABNORMAL LOW (ref 3.5–5.0)
BUN: 59 mg/dL — ABNORMAL HIGH (ref 6–20)
CALCIUM: 7.9 mg/dL — AB (ref 8.9–10.3)
CO2: 16 mmol/L — AB (ref 22–32)
Chloride: 110 mmol/L (ref 101–111)
Creatinine, Ser: 9.89 mg/dL — ABNORMAL HIGH (ref 0.44–1.00)
GFR calc non Af Amer: 4 mL/min — ABNORMAL LOW (ref >60)
GFR, EST AFRICAN AMERICAN: 4 mL/min — AB (ref >60)
Glucose, Bld: 75 mg/dL (ref 65–99)
POTASSIUM: 4.3 mmol/L (ref 3.5–5.1)
SODIUM: 137 mmol/L (ref 135–145)
Total Bilirubin: 1.3 mg/dL — ABNORMAL HIGH (ref 0.3–1.2)
Total Protein: 5.2 g/dL — ABNORMAL LOW (ref 6.5–8.1)

## 2016-08-03 LAB — CBC
HCT: 21.4 % — ABNORMAL LOW (ref 36.0–46.0)
HEMOGLOBIN: 7.3 g/dL — AB (ref 12.0–15.0)
MCH: 30.9 pg (ref 26.0–34.0)
MCHC: 34.1 g/dL (ref 30.0–36.0)
MCV: 90.7 fL (ref 78.0–100.0)
Platelets: 78 10*3/uL — ABNORMAL LOW (ref 150–400)
RBC: 2.36 MIL/uL — AB (ref 3.87–5.11)
RDW: 17.3 % — ABNORMAL HIGH (ref 11.5–15.5)
WBC: 5.2 10*3/uL (ref 4.0–10.5)

## 2016-08-03 LAB — AMMONIA: AMMONIA: 48 umol/L — AB (ref 9–35)

## 2016-08-03 LAB — CREATININE, URINE, RANDOM: CREATININE, URINE: 118.64 mg/dL

## 2016-08-03 LAB — SODIUM, URINE, RANDOM: SODIUM UR: 40 mmol/L

## 2016-08-03 LAB — HIV ANTIBODY (ROUTINE TESTING W REFLEX): HIV SCREEN 4TH GENERATION: NONREACTIVE

## 2016-08-03 MED ORDER — DIPHENHYDRAMINE HCL 25 MG PO CAPS
25.0000 mg | ORAL_CAPSULE | Freq: Three times a day (TID) | ORAL | Status: DC | PRN
Start: 2016-08-03 — End: 2016-08-09
  Administered 2016-08-03: 25 mg via ORAL
  Filled 2016-08-03: qty 1

## 2016-08-03 MED ORDER — MORPHINE SULFATE (PF) 2 MG/ML IV SOLN
INTRAVENOUS | Status: AC
  Administered 2016-08-03: 2 mg via INTRAVENOUS
  Filled 2016-08-03: qty 1

## 2016-08-03 MED ORDER — ALBUMIN HUMAN 25 % IV SOLN
25.0000 g | Freq: Four times a day (QID) | INTRAVENOUS | Status: AC
  Administered 2016-08-03 – 2016-08-04 (×2): 25 g via INTRAVENOUS
  Filled 2016-08-03 (×2): qty 100

## 2016-08-03 MED ORDER — DEXTROSE 5 % IV SOLN
160.0000 mg | Freq: Four times a day (QID) | INTRAVENOUS | Status: DC
  Administered 2016-08-03: 160 mg via INTRAVENOUS
  Filled 2016-08-03 (×2): qty 16

## 2016-08-03 MED ORDER — MIDODRINE HCL 5 MG PO TABS
10.0000 mg | ORAL_TABLET | Freq: Three times a day (TID) | ORAL | Status: DC
  Administered 2016-08-03 – 2016-08-27 (×69): 10 mg via ORAL
  Filled 2016-08-03 (×67): qty 2

## 2016-08-03 MED ORDER — FUROSEMIDE 10 MG/ML IJ SOLN: 160.0000 mg | Freq: Four times a day (QID) | INTRAVENOUS | Status: DC

## 2016-08-03 NOTE — Progress Notes (Signed)
PROGRESS NOTE    Loretta [name]  ZMO:[id number] DOB: 1956-04-05 DOA: 08/02/2016 PCP: Cyndy Freeze, MD   Brief Narrative:  61 y.o. female with medical history significant of liver cirrhosis secondary to NASH, history of edema treated with Lasix. Presenting today after patient was found to have elevated serum creatinine level at her primary care's office. Patient was recently treated for colitis of which she was sent home with Cipro and Flagyl. She completed 12 out of the 14 days of treatment   Assessment & Plan:   Active Problems:     ARF (acute renal failure) (HCC) - Most likely prerenal etiology. Will provide gentle fluid hydration and reassess. Nephrology consulted and very much appreciate their help in this case. - On sure about baseline creatinine. Nephrology managing workup - consulted palliative for GOC  Liver cirrhosis secondary to NASH ([place])  - We'll continue rifaximin main and lactulose. - Assess ammonia levels - CMP ordered and showing normal liver enzyme levels    Colitis -  We'll continue Cipro Flagyl while in house    DVT prophylaxis: Heparin Code Status: Full Family Communication: None at bedside Disposition Plan: pending improvement in condition   Consultants:   Nephrology  Palliative consulted today for [place]  Procedures: None  Antimicrobials: cipro and flagyl   Subjective: Pt has no new complaints to report today.  Objective: Vitals:   08/02/16 2044 08/03/16 0459 08/03/16 0946 08/03/16 1300  BP: (!) 105/27 (!) 94/13 (!) 115/43   Pulse: 62 (!) 121 65   Resp: '18 19 18   '$ Temp: 98 F (36.7 C) 97 F (36.1 C) 98.4 F (36.9 C)   TempSrc:   Oral   SpO2: 95% 100% 97%   Weight:    124 kg (273 lb 5.9 oz)  Height:        Intake/Output Summary (Last 24 hours) at 08/03/16 1424 Last data filed at 08/03/16 1040  Gross per 24 hour  Intake              580 ml  Output              175 ml  Net              405 ml   Filed Weights   08/03/16 1300    Weight: 124 kg (273 lb 5.9 oz)    Examination:  General exam: Appears calm and comfortable, in nad. Respiratory system: Clear to auscultation. Respiratory effort normal. Cardiovascular system: S1 & S2 heard, RRR. No JVD, murmurs, rubs, gallops or clicks. No pedal edema. Gastrointestinal system: Abdomen is nondistended, soft and nontender. No organomegaly or masses felt. Normal bowel sounds heard. Central nervous system: Alert and oriented. No focal neurological deficits. Extremities: Symmetric 5 x 5 power. Skin: No rashes, lesions or ulcers, on limited exam Psychiatry: . Mood & affect appropriate.     Data Reviewed: I have personally reviewed following labs and imaging studies  CBC:  Recent Labs Lab 08/02/16 1900 08/03/16 0548  WBC 5.5 5.2  HGB 7.7* 7.3*  HCT 22.5* 21.4*  MCV 90.4 90.7  PLT 94* 78*   Basic Metabolic Panel:  Recent Labs Lab 08/02/16 1900 08/03/16 0548  NA 135 137  K 4.5 4.3  CL 110 110  CO2 16* 16*  GLUCOSE 95 75  BUN 57* 59*  CREATININE 9.61* 9.89*  CALCIUM 7.7* 7.9*   GFR: Estimated Creatinine Clearance: 8 mL/min (A) (by C-G formula based on SCr of 9.89 mg/dL (H)). Liver Function Tests:  Recent  Labs Lab 08/02/16 1900 08/03/16 0548  AST 31 29  ALT 12* 14  ALKPHOS 83 80  BILITOT 1.2 1.3*  PROT 5.4* 5.2*  ALBUMIN 2.4* 2.6*   No results for input(s): LIPASE, AMYLASE in the last 168 hours.  Recent Labs Lab 08/03/16 0548  AMMONIA 48*   Coagulation Profile: No results for input(s): INR, PROTIME in the last 168 hours. Cardiac Enzymes: No results for input(s): CKTOTAL, CKMB, CKMBINDEX, TROPONINI in the last 168 hours. BNP (last 3 results) No results for input(s): PROBNP in the last 8760 hours. HbA1C: No results for input(s): HGBA1C in the last 72 hours. CBG: No results for input(s): GLUCAP in the last 168 hours. Lipid Profile: No results for input(s): CHOL, HDL, LDLCALC, TRIG, CHOLHDL, LDLDIRECT in the last 72 hours. Thyroid  Function Tests: No results for input(s): TSH, T4TOTAL, FREET4, T3FREE, THYROIDAB in the last 72 hours. Anemia Panel: No results for input(s): VITAMINB12, FOLATE, FERRITIN, TIBC, IRON, RETICCTPCT in the last 72 hours. Sepsis Labs: No results for input(s): PROCALCITON, LATICACIDVEN in the last 168 hours.  No results found for this or any previous visit (from the past 240 hour(s)).   Radiology Studies: No results found.  Scheduled Meds: . ciprofloxacin  400 mg Intravenous Q24H  . furosemide  80 mg Intravenous BID  . heparin  5,000 Units Subcutaneous Q8H  . lactulose  20 g Oral BID  . metronidazole  500 mg Intravenous Q8H  . nadolol  20 mg Oral QPM  . rifaximin  550 mg Oral BID  . sodium chloride flush  3 mL Intravenous Q12H  . venlafaxine XR  75 mg Oral Daily   Continuous Infusions: . sodium chloride 75 mL/hr at 08/02/16 2142     LOS: 1 day   Time spent: > 35 minutes  Velvet Bathe, MD [facility] Pager [phone]  If 7PM-7AM, please contact night-coverage [email or web address] Password [email or web address] 08/03/2016, 2:24 PM

## 2016-08-03 NOTE — Progress Notes (Signed)
[facility] Rounding Note  Subjective/Interval History:   Pt still getting IVF's (?) I have stopped them No response to lasix/albumin but did not get the doses together as had been intended Creatinine continues to rise BP soft I questioned husband/pt about the zaroxolyn she got recently - they both said it produced NO increase is UOP so can't really say was diuretic induced vol depletion (hemodynamic effects of ABLA more likely)  Tells me in no uncertain terms that she wants to live, even if it includes a trial of dialysis, not knowing if it will be tolerated  Objective Vital signs in last 24 hours: Vitals:   08/02/16 2044 08/03/16 0459 08/03/16 0946 08/03/16 1300  BP: (!) 105/27 (!) 94/13 (!) 115/43   Pulse: 62 (!) 121 65   Resp: '18 19 18   '$ Temp: 98 F (36.7 C) 97 F (36.1 C) 98.4 F (36.9 C)   TempSrc:   Oral   SpO2: 95% 100% 97%   Weight:    124 kg (273 lb 5.9 oz)  Height:       Weight change:   Intake/Output Summary (Last 24 hours) at 08/03/16 1556 Last data filed at 08/03/16 1500  Gross per 24 hour  Intake              820 ml  Output              325 ml  Net              495 ml   Physical Exam:  Blood pressure (!) 115/43, pulse 65, temperature 98.4 F (36.9 C), temperature source Oral, resp. rate 18, height '5\' 4"'$  (1.626 m), weight 124 kg (273 lb 5.9 oz), SpO2 97 %.   General appearance: alert, cooperative, morbidly obese and marked anasarca Normocephalic, without obvious abnormality, atraumatic Eyes: negative findings: lids and lashes normal, conjunctivae and sclerae normal and corneas clear Resp: diminished breath sounds bilaterally no crackles Cardio: faint heart sounds, no rub, regular rate and rhythm GI: obese, +BS, +fluid wave Massive edema even if perineum and abd wall Extremities: anasarca to neck Surprisingly no asterixus   Recent Labs Lab 08/02/16 1900 08/03/16 0548  NA 135 137  K 4.5 4.3  CL 110 110  CO2 16* 16*  GLUCOSE 95 75  BUN 57* 59*   CREATININE 9.61* 9.89*  CALCIUM 7.7* 7.9*    Recent Labs Lab 08/02/16 1900 08/03/16 0548  AST 31 29  ALT 12* 14  ALKPHOS 83 80  BILITOT 1.2 1.3*  PROT 5.4* 5.2*  ALBUMIN 2.4* 2.6*    Recent Labs Lab 08/03/16 0548  AMMONIA 48*     Recent Labs Lab 08/02/16 1900 08/03/16 0548  WBC 5.5 5.2  HGB 7.7* 7.3*  HCT 22.5* 21.4*  MCV 90.4 90.7  PLT 94* 78*    Medications: . sodium chloride 75 mL/hr at 08/03/16 1439   . ciprofloxacin  400 mg Intravenous Q24H  . furosemide  80 mg Intravenous BID  . heparin  5,000 Units Subcutaneous Q8H  . lactulose  20 g Oral BID  . metronidazole  500 mg Intravenous Q8H  . nadolol  20 mg Oral QPM  . rifaximin  550 mg Oral BID  . sodium chloride flush  3 mL Intravenous Q12H  . venlafaxine XR  75 mg Oral Daily   Background: PMH significant for advanced non-alcoholic cirrhosis, HTN, COPD, morbid obesity, depression, CKD stage 3-4, and recent admission to [facility] with colitis, who presented back to [facility]  on 07/31/16 with hematochezia and upon evaluation was noted to have AKI/CKD with BUN/Cr of 54/9.  This was felt to be due to ABLA as well as intravascular volume depletion after having zaroxolyn added to her outpatient diuretic regimen (which she said induced NO increase in UOP).  She was given IVF's, however her Scr did not significantly improve and continued to range from 8.9 to 9.1 and was transferred to [facility] 3/29 for further evaluation and management of her AKI.  A renal US performed at [facility] was notable for Right Kidney of 8.1 cm with increased cortical echogenicity Left Kidney 10.2 cm, with normal echogenicity.  No evidence of renal mass or obstruction.     Her husband tells me that [name] indicated she was ineligible for transplant unless she could lose 40 lb.     Assessment/Plan: 1.  AKI/CKD- multiple renal insults with cirrhosis, intravascular volume depletion (though by her report metolazone didot increase UOP at all, and  ABLA with hypotension.  Hopefully this is just ischemic ATN and not hepatorenal syndrome though I suspect the latter.  Pt is a poor long-term dialysis candidate and is currently not a candidate for liver transplant due to her morbid obesity. However - tells me in no uncertain terms that she wants to proceed with a trial of dialysis if she needs it. Renal function worsening.  Will re-try the lasix/albumin combo first, add midodrine for BP.    2. Cirrhosis (NASH)- as above.  Continue with lactulose.  Marked anasarca and will try albumin and lasix as above. 3. ABLA- hematochezia related to colitis- may need blood transfusion if continues to drop 4. CKD stage 3-4- unclear what her baseline Scr has been since September.  She denies being told that she has CKD. Renal US showed atrophic right kidney.  She denies any knowledge of injury or event to explain this but did have atherosclerosis of her abdominal aorta on CT scan and may have renal artery stenosis.  Need to contact [facility] to get baseline Scr (but their office is closed today)  5. HTN- low, hold nadalol 6. Morbid obesity- s/p gastric bypass surgery in 2010 7. Protein malnutrition 8. 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

## 2016-08-03 NOTE — Care Management Note (Addendum)
Case Management Note  Patient Details  Name: Loretta [name] MRN: [id number] Date of Birth: 10-20-1955  Subjective/Objective:     CM following for progression and d/c planning.                Action/Plan: 08/03/16 No HH needs identified, pt is ambulatory has walkers and canes.  Pt is not homebound. No needs identified at this time.   Expected Discharge Date:                  Expected Discharge Plan:  Home/Self Care  In-House Referral:  NA  Discharge planning Services  CM Consult  Post Acute Care Choice:  NA Choice offered to:  NA  DME Arranged:   NA DME Agency:   NA  HH Arranged:   NA HH Agency:   NA  Status of Service:  In process, will continue to follow  If discussed at Long Length of Stay Meetings, dates discussed:    Additional Comments:  Adron Bene, RN 08/03/2016, 10:51 AM

## 2016-08-04 ENCOUNTER — Inpatient Hospital Stay (HOSPITAL_COMMUNITY): Payer: Medicare HMO

## 2016-08-04 DIAGNOSIS — I959 Hypotension, unspecified: Secondary | ICD-10-CM

## 2016-08-04 LAB — RENAL FUNCTION PANEL
ALBUMIN: 2.8 g/dL — AB (ref 3.5–5.0)
ANION GAP: 13 (ref 5–15)
ANION GAP: 8 (ref 5–15)
Albumin: 2.9 g/dL — ABNORMAL LOW (ref 3.5–5.0)
BUN: 64 mg/dL — ABNORMAL HIGH (ref 6–20)
BUN: 61 mg/dL — ABNORMAL HIGH (ref 6–20)
CALCIUM: 7.9 mg/dL — AB (ref 8.9–10.3)
CO2: 14 mmol/L — AB (ref 22–32)
CO2: 16 mmol/L — ABNORMAL LOW (ref 22–32)
Calcium: 8 mg/dL — ABNORMAL LOW (ref 8.9–10.3)
Chloride: 109 mmol/L (ref 101–111)
Chloride: 109 mmol/L (ref 101–111)
Creatinine, Ser: 9.92 mg/dL — ABNORMAL HIGH (ref 0.44–1.00)
Creatinine, Ser: 9.05 mg/dL — ABNORMAL HIGH (ref 0.44–1.00)
GFR calc Af Amer: 4 mL/min — ABNORMAL LOW (ref >60)
GFR calc non Af Amer: 4 mL/min — ABNORMAL LOW (ref >60)
GFR calc non Af Amer: 4 mL/min — ABNORMAL LOW (ref >60)
GFR, EST AFRICAN AMERICAN: 5 mL/min — AB (ref >60)
GLUCOSE: 66 mg/dL (ref 65–99)
Glucose, Bld: 67 mg/dL (ref 65–99)
PHOSPHORUS: 7.9 mg/dL — AB (ref 2.5–4.6)
PHOSPHORUS: 7.1 mg/dL — AB (ref 2.5–4.6)
POTASSIUM: 4.5 mmol/L (ref 3.5–5.1)
Potassium: 4.2 mmol/L (ref 3.5–5.1)
SODIUM: 133 mmol/L — AB (ref 135–145)
Sodium: 136 mmol/L (ref 135–145)

## 2016-08-04 LAB — CBC
HEMATOCRIT: 22.4 % — AB (ref 36.0–46.0)
HEMOGLOBIN: 7.4 g/dL — AB (ref 12.0–15.0)
MCH: 30.2 pg (ref 26.0–34.0)
MCHC: 33 g/dL (ref 30.0–36.0)
MCV: 91.4 fL (ref 78.0–100.0)
Platelets: 90 10*3/uL — ABNORMAL LOW (ref 150–400)
RBC: 2.45 MIL/uL — ABNORMAL LOW (ref 3.87–5.11)
RDW: 17.2 % — ABNORMAL HIGH (ref 11.5–15.5)
WBC: 5.4 10*3/uL (ref 4.0–10.5)

## 2016-08-04 LAB — PREPARE RBC (CROSSMATCH)

## 2016-08-04 LAB — AMMONIA: Ammonia: 74 umol/L — ABNORMAL HIGH (ref 9–35)

## 2016-08-04 LAB — MAGNESIUM: Magnesium: 2 mg/dL (ref 1.7–2.4)

## 2016-08-04 MED ORDER — SODIUM CHLORIDE 0.9 % IV SOLN: Freq: Once | INTRAVENOUS | Status: DC

## 2016-08-04 MED ORDER — SODIUM CHLORIDE 0.9 % FOR CRRT
INTRAVENOUS_CENTRAL | Status: DC | PRN
  Filled 2016-08-04: qty 1000

## 2016-08-04 MED ORDER — PRISMASOL BGK 4/2.5 32-4-2.5 MEQ/L IV SOLN
INTRAVENOUS | Status: DC
  Administered 2016-08-04 – 2016-08-12 (×17): via INTRAVENOUS_CENTRAL
  Filled 2016-08-04 (×23): qty 5000

## 2016-08-04 MED ORDER — DIPHENHYDRAMINE HCL 25 MG PO CAPS
25.0000 mg | ORAL_CAPSULE | Freq: Once | ORAL | Status: AC
  Administered 2016-08-04: 25 mg via ORAL
  Filled 2016-08-04: qty 1

## 2016-08-04 MED ORDER — SODIUM CHLORIDE 0.9% FLUSH
10.0000 mL | Freq: Two times a day (BID) | INTRAVENOUS | Status: DC
  Administered 2016-08-06: 20 mL
  Administered 2016-08-07 – 2016-08-10 (×6): 10 mL
  Administered 2016-08-11: 30 mL
  Administered 2016-08-11 – 2016-08-12 (×2): 10 mL
  Administered 2016-08-12: 20 mL
  Administered 2016-08-13 – 2016-08-22 (×9): 10 mL
  Administered 2016-08-22 – 2016-08-24 (×4): 3 mL
  Administered 2016-08-25 – 2016-08-26 (×2): 10 mL

## 2016-08-04 MED ORDER — PRISMASOL BGK 4/2.5 32-4-2.5 MEQ/L IV SOLN
INTRAVENOUS | Status: DC
  Administered 2016-08-04 – 2016-08-13 (×18): via INTRAVENOUS_CENTRAL
  Filled 2016-08-04 (×26): qty 5000

## 2016-08-04 MED ORDER — SODIUM CHLORIDE 0.9% FLUSH
3.0000 mL | INTRAVENOUS | Status: DC | PRN
  Administered 2016-08-04 – 2016-08-05 (×2): 3 mL via INTRAVENOUS
  Filled 2016-08-04 (×2): qty 3

## 2016-08-04 MED ORDER — HEPARIN SODIUM (PORCINE) 1000 UNIT/ML DIALYSIS
1000.0000 [IU] | INTRAMUSCULAR | Status: DC | PRN
  Administered 2016-08-11: 2400 [IU] via INTRAVENOUS_CENTRAL
  Filled 2016-08-04: qty 6
  Filled 2016-08-04: qty 3
  Filled 2016-08-04: qty 6

## 2016-08-04 MED ORDER — SODIUM CHLORIDE 0.9% FLUSH
10.0000 mL | INTRAVENOUS | Status: DC | PRN
Start: 2016-08-04 — End: 2016-08-27
  Administered 2016-08-05 – 2016-08-17 (×3): 10 mL
  Filled 2016-08-04 (×3): qty 40

## 2016-08-04 MED ORDER — ACETAMINOPHEN 325 MG PO TABS
650.0000 mg | ORAL_TABLET | Freq: Once | ORAL | Status: AC
  Administered 2016-08-04: 650 mg via ORAL
  Filled 2016-08-04: qty 2

## 2016-08-04 MED ORDER — SODIUM CHLORIDE 0.9 % IV SOLN
0.0000 ug/min | INTRAVENOUS | Status: DC
  Administered 2016-08-04: 20 ug/min via INTRAVENOUS
  Administered 2016-08-04: 25 ug/min via INTRAVENOUS
  Filled 2016-08-04 (×4): qty 1

## 2016-08-04 MED ORDER — PRISMASOL BGK 4/2.5 32-4-2.5 MEQ/L IV SOLN
INTRAVENOUS | Status: DC
  Administered 2016-08-04 – 2016-08-12 (×36): via INTRAVENOUS_CENTRAL
  Filled 2016-08-04 (×48): qty 5000

## 2016-08-04 NOTE — Progress Notes (Signed)
PROGRESS NOTE    Loretta [name]  INO:[id number] DOB: [birth year] DOA: 08/02/2016 PCP: Cyndy Freeze, MD   Brief Narrative:  61 y.o. female with medical history significant of liver cirrhosis secondary to NASH, history of edema treated with Lasix. Presenting today after patient was found to have elevated serum creatinine level at her primary care's office. Patient was recently treated for colitis of which she was sent home with Cipro and Flagyl. She completed 13 out of the 14 days of treatment   Assessment & Plan:   Active Problems:     ARF (acute renal failure) ([place]) - Etiology uncertain, suspected prerenal but creatinine did not improve with fluids.  Nephrology assisting and managing work up - On sure about baseline creatinine. Nephrology managing workup - consulted palliative for GOC  Liver cirrhosis secondary to NASH ([place])  - We'll continue rifaximin main and lactulose. - Assess ammonia levels - CMP ordered and showing normal liver enzyme levels    Colitis -  Cipro Flagyl while in house    DVT prophylaxis: Heparin Code Status: Full Family Communication: None at bedside Disposition Plan: transfer to step down unit   Consultants:   Nephrology  Palliative consulted today for [place]  Procedures: None  Antimicrobials: cipro and flagyl   Subjective: Pt denies any new problems, transferring to stepdown unit  Objective: Vitals:   08/04/16 0905 08/04/16 1130 08/04/16 1200 08/04/16 1300  BP: (!) 116/46 (!) 110/42    Pulse: 68  62 (!) 59  Resp: '18  12 17  '$ Temp: 97.5 F (36.4 C)  97.3 F (36.3 C)   TempSrc: Oral  Oral   SpO2: 96%  100% 98%  Weight:      Height:        Intake/Output Summary (Last 24 hours) at 08/04/16 1305 Last data filed at 08/04/16 0849  Gross per 24 hour  Intake             1500 ml  Output              470 ml  Net             1030 ml   Filed Weights   08/03/16 1300  Weight: 124 kg (273 lb 5.9 oz)    Examination:  General exam:  Appears calm and comfortable, in nad. Respiratory system: Clear to auscultation. Respiratory effort normal. Cardiovascular system: S1 & S2 heard, RRR. No JVD, murmurs, rubs, gallops or clicks. No pedal edema. Gastrointestinal system: Abdomen is nondistended, soft and nontender. No organomegaly or masses felt. Normal bowel sounds heard. Central nervous system: Alert and oriented. No focal neurological deficits. Extremities: Symmetric 5 x 5 power. Skin: No rashes, lesions or ulcers, on limited exam Psychiatry: . Mood & affect appropriate.     Data Reviewed: I have personally reviewed following labs and imaging studies  CBC:  Recent Labs Lab 08/02/16 1900 08/03/16 0548 08/04/16 0438  WBC 5.5 5.2 5.4  HGB 7.7* 7.3* 7.4*  HCT 22.5* 21.4* 22.4*  MCV 90.4 90.7 91.4  PLT 94* 78* 90*   Basic Metabolic Panel:  Recent Labs Lab 08/02/16 1900 08/03/16 0548 08/04/16 0438 08/04/16 0450  NA 135 137 136  --   K 4.5 4.3 4.5  --   CL 110 110 109  --   CO2 16* 16* 14*  --   GLUCOSE 95 75 66  --   BUN 57* 59* 64*  --   CREATININE 9.61* 9.89* 9.92*  --   CALCIUM 7.7* 7.9* 8.0*  --  MG  --   --   --  2.0  PHOS  --   --  7.9*  --    GFR: Estimated Creatinine Clearance: 7.9 mL/min (A) (by C-G formula based on SCr of 9.92 mg/dL (H)). Liver Function Tests:  Recent Labs Lab 08/02/16 1900 08/03/16 0548 08/04/16 0438  AST 31 29  --   ALT 12* 14  --   ALKPHOS 83 80  --   BILITOT 1.2 1.3*  --   PROT 5.4* 5.2*  --   ALBUMIN 2.4* 2.6* 2.9*   No results for input(s): LIPASE, AMYLASE in the last 168 hours.  Recent Labs Lab 08/03/16 0548 08/04/16 0430  AMMONIA 48* 74*   Coagulation Profile: No results for input(s): INR, PROTIME in the last 168 hours. Cardiac Enzymes: No results for input(s): CKTOTAL, CKMB, CKMBINDEX, TROPONINI in the last 168 hours. BNP (last 3 results) No results for input(s): PROBNP in the last 8760 hours. HbA1C: No results for input(s): HGBA1C in the last  72 hours. CBG: No results for input(s): GLUCAP in the last 168 hours. Lipid Profile: No results for input(s): CHOL, HDL, LDLCALC, TRIG, CHOLHDL, LDLDIRECT in the last 72 hours. Thyroid Function Tests: No results for input(s): TSH, T4TOTAL, FREET4, T3FREE, THYROIDAB in the last 72 hours. Anemia Panel: No results for input(s): VITAMINB12, FOLATE, FERRITIN, TIBC, IRON, RETICCTPCT in the last 72 hours. Sepsis Labs: No results for input(s): PROCALCITON, LATICACIDVEN in the last 168 hours.  No results found for this or any previous visit (from the past 240 hour(s)).   Radiology Studies: No results found.  Scheduled Meds: . sodium chloride   Intravenous Once  . ciprofloxacin  400 mg Intravenous Q24H  . heparin  5,000 Units Subcutaneous Q8H  . lactulose  20 g Oral BID  . metronidazole  500 mg Intravenous Q8H  . midodrine  10 mg Oral TID WC  . nadolol  20 mg Oral QPM  . rifaximin  550 mg Oral BID  . sodium chloride flush  3 mL Intravenous Q12H  . venlafaxine XR  75 mg Oral Daily   Continuous Infusions: . dialysis replacement fluid (prismasate)    . dialysis replacement fluid (prismasate)    . dialysate (PRISMASATE)       LOS: 2 days   Time spent: > 35 minutes  Velvet Bathe, MD [facility] Pager [phone]  If 7PM-7AM, please contact night-coverage [email or web address] Password [email or web address] 08/04/2016, 1:05 PM

## 2016-08-04 NOTE — Progress Notes (Signed)
Dr. Chase Caller on unit and made aware of patients MAPs 53-60 since initiation of CRRT (keeping even) and systolics in the 58P when attempting to pull fluid. Order written for Neo to maintain MAPs >60.  Vena Austria

## 2016-08-04 NOTE — Procedures (Signed)
Hemodialysis Insertion Procedure Note Loretta [name] [id number] Aug 27, 1955  Procedure: Insertion of Hemodialysis Catheter Type: 3 port  Indications: Hemodialysis   Procedure Details Consent: Risks of procedure as well as the alternatives and risks of each were explained to the (patient/caregiver).  Consent for procedure obtained. Time Out: Verified patient identification, verified procedure, site/side was marked, verified correct patient position, special equipment/implants available, medications/allergies/relevent history reviewed, required imaging and test results available.  Performed  Maximum sterile technique was used including antiseptics, cap, gloves, gown, hand hygiene, mask and sheet. Skin prep: Chlorhexidine; local anesthetic administered A antimicrobial bonded/coated triple lumen catheter was placed in the right internal jugular vein using the Seldinger technique. Ultrasound guidance used.Yes.   Catheter placed to 16 cm. Blood aspirated via all 3 ports and then flushed x 3. Line sutured x 2 and dressing applied.  Evaluation Blood flow good Complications: No apparent complications Patient did tolerate procedure well. Chest X-ray ordered to verify placement.  CXR: pending.  Richardson Landry Minor ACNP Maryanna Shape PCCM Pager [phone] till 3 pm If no answer page [phone] 08/04/2016, 1:05 PM

## 2016-08-04 NOTE — Progress Notes (Signed)
   Call from [facility] - patient needs CRRT - move to ICU under PCCM. Needs HD cath =-   Recent Labs Lab 08/02/16 1900 08/03/16 0548 08/04/16 0438  HGB 7.7* 7.3* 7.4*  HCT 22.5* 21.4* 22.4*  WBC 5.5 5.2 5.4  PLT 94* 78* 90*   No results for input(s): INR in the last 168 hours.    Dr. Brand Males, M.D., [facility].C.P Pulmonary and Critical Care Medicine Staff Physician [facility] Pager: [phone], If no answer or between  15:00h - 7:00h: call [phone]  08/04/2016 8:47 AM

## 2016-08-04 NOTE — Consult Note (Signed)
PULMONARY / CRITICAL CARE MEDICINE   Name: Loretta [name] MRN: [id number] DOB: 1955/06/16    ADMISSION DATE:  08/02/2016 CONSULTATION DATE: 3/30  REFERRING MD:  Renal  CHIEF COMPLAINT:  Renal failure  HISTORY OF PRESENT ILLNESS:   61 yo MO(273lbs) hx of gastric bypass, non acholic cirrhosis , acute on chronic renal dz, not a candidate for liver tx due to obesity, who is moved to ICU for HD and HD cath placement. PCCM asked to follow in ICU. She is awake and alert and in NAD. RIJ HD catheter was placed and HD to be instituted per renal.  PAST MEDICAL HISTORY :  She  has a past medical history of CKD (chronic kidney disease) stage 4, GFR 15-29 ml/min (HCC) (01/2016); COPD (chronic obstructive pulmonary disease) ([place]); Depression; Hypertension; Liver cirrhosis secondary to NASH ([place]); Morbid obesity ([place]); Nephrolithiasis; and Thrombocytopenia ([place]).  PAST SURGICAL HISTORY: She  has a past surgical history that includes Cholecystectomy; Gastric bypass; and Esophagogastroduodenoscopy (N/A, 01/16/2016).  Allergies  Allergen Reactions  . Codeine Other (See Comments)    Unknown  . Cymbalta [Duloxetine Hcl] Other (See Comments)    Unknown  . Levaquin [Levofloxacin In D5w] Other (See Comments)    Unknown  . Tomato Itching    No current facility-administered medications on file prior to encounter.    Current Outpatient Prescriptions on File Prior to Encounter  Medication Sig  . docusate sodium (COLACE) 100 MG capsule Take 100 mg by mouth daily as needed for moderate constipation.   . folic acid (FOLVITE) 1 MG tablet Take 1 mg by mouth daily.  . furosemide (LASIX) 20 MG tablet Take 1 tablet (20 mg total) by mouth 2 (two) times daily. Restart in 5-6days after FU with PCP  . KRISTALOSE 20 g packet Take 20 g by mouth 3 (three) times daily.   . Melatonin 3 MG TABS Take 3 mg by mouth at bedtime.  . milk thistle 175 MG tablet Take 175 mg by mouth 2 (two) times daily.  . Multiple Vitamin  (MULTI-VITAMIN PO) Take 1 tablet by mouth daily.  . nadolol (CORGARD) 40 MG tablet Take 40 mg by mouth 2 (two) times daily.   . pantoprazole (PROTONIX) 40 MG tablet Take 1 tablet (40 mg total) by mouth 2 (two) times daily before a meal.  . pramipexole (MIRAPEX) 0.25 MG tablet Take 0.25 mg by mouth 2 (two) times daily.  . Probiotic Product (PROBIOTIC PO) Take 1 tablet by mouth daily.  Marland Kitchen spironolactone (ALDACTONE) 50 MG tablet Take 1 tablet (50 mg total) by mouth daily. Restart in 5-6days after FU with PCP (Patient taking differently: Take 25 mg by mouth daily. Restart in 5-6days after FU with PCP)  . XIFAXAN 550 MG TABS tablet Take 550 mg by mouth 2 (two) times daily.    FAMILY HISTORY:  Her indicated that her father is deceased. She indicated that her brother is deceased.    SOCIAL HISTORY: She  reports that she has quit smoking. She has never used smokeless tobacco. She reports that she does not drink alcohol or use drugs.  REVIEW OF SYSTEMS:   10 point review of system taken, please see HPI for positives and negatives.   SUBJECTIVE:  NAD at rest  VITAL SIGNS: BP (!) 101/45   Pulse (!) 59   Temp 97.3 F (36.3 C) (Oral)   Resp 16   Ht '5\' 4"'$  (1.626 m)   Wt 124 kg (273 lb 5.9 oz)   SpO2 100%  BMI 46.92 kg/m   HEMODYNAMICS:    VENTILATOR SETTINGS:    INTAKE / OUTPUT: I/O last 3 completed shifts: In: 1740 [P.O.:840; IV Piggyback:900] Out: 645 [Urine:645]  PHYSICAL EXAMINATION: General:  MOWFNAD Neuro: Inatct HEENT:  No neck Cardiovascular: HSR Murmur Lungs:  Decreased in bases  Abdomen: Obese soft +bs Musculoskeletal:  intact Skin:  WDI ++edema  LABS:  BMET  Recent Labs Lab 08/02/16 1900 08/03/16 0548 08/04/16 0438  NA 135 137 136  K 4.5 4.3 4.5  CL 110 110 109  CO2 16* 16* 14*  BUN 57* 59* 64*  CREATININE 9.61* 9.89* 9.92*  GLUCOSE 95 75 66    Electrolytes  Recent Labs Lab 08/02/16 1900 08/03/16 0548 08/04/16 0438 08/04/16 0450  CALCIUM  7.7* 7.9* 8.0*  --   MG  --   --   --  2.0  PHOS  --   --  7.9*  --     CBC  Recent Labs Lab 08/02/16 1900 08/03/16 0548 08/04/16 0438  WBC 5.5 5.2 5.4  HGB 7.7* 7.3* 7.4*  HCT 22.5* 21.4* 22.4*  PLT 94* 78* 90*    Coag's No results for input(s): APTT, INR in the last 168 hours.  Sepsis Markers No results for input(s): LATICACIDVEN, PROCALCITON, O2SATVEN in the last 168 hours.  ABG No results for input(s): PHART, PCO2ART, PO2ART in the last 168 hours.  Liver Enzymes  Recent Labs Lab 08/02/16 1900 08/03/16 0548 08/04/16 0438  AST 31 29  --   ALT 12* 14  --   ALKPHOS 83 80  --   BILITOT 1.2 1.3*  --   ALBUMIN 2.4* 2.6* 2.9*    Cardiac Enzymes No results for input(s): TROPONINI, PROBNP in the last 168 hours.  Glucose No results for input(s): GLUCAP in the last 168 hours.  Imaging Dg Chest Port 1 View  Result Date: 08/04/2016 CLINICAL DATA:  Encounter for central line placement, history hypertension, morbid obesity, COPD, chronic kidney disease EXAM: PORTABLE CHEST 1 VIEW COMPARISON:  Portable exam 1350 hours compared to 05/18/2016 FINDINGS: RIGHT jugular line with tip projecting over SVC. Borderline enlargement of cardiac silhouette. Mediastinal contours and pulmonary vascularity normal. Atelectasis versus infiltrate at medial RIGHT lung base. Remaining lungs clear. No pleural effusion or pneumothorax. Bones demineralized. IMPRESSION: No pneumothorax following central line placement. Atelectasis versus infiltrate in medial RIGHT lower lobe. Electronically Signed   By: Lavonia Dana M.D.   On: 08/04/2016 14:02     STUDIES:    CULTURES:   ANTIBIOTICS: 3/29 cipro>> 3/29 flagyl>> 3/29 Xifaxan>>  SIGNIFICANT EVENTS:   LINES/TUBES: 3/31 rt I j hd cath>>  DISCUSSION: 61 yo MO(273lbs) hx of gastric bypass, non acholic cirrhosis , acute on chronic renal dz, not a candidate for liver tx due to obesity, who is moved to ICU for HD and HD cath placement. PCCM  asked to follow in ICU. She is awake and alert and in NAD. RIJ HD catheter was placed and HD to be instituted per renal.  ASSESSMENT / PLAN:  PULMONARY A: OSA COPD  P:   PRM bipap  CARDIOVASCULAR A:  HTN P:  Hold antihypertensives  RENAL Lab Results  Component Value Date   CREATININE 9.92 (H) 08/04/2016   CREATININE 9.89 (H) 08/03/2016   CREATININE 9.61 (H) 08/02/2016    A:   Acute on chronic kidney dz in setting of non alcoholic cirrhosis  metabolic acidosis P:   Placed rt I j HD cath HD per renal  GASTROINTESTINAL A:  HX OF gib P:   Follow labs PPI Tx as needed  HEMATOLOGIC  Recent Labs  08/03/16 0548 08/04/16 0438  HGB 7.3* 7.4*    A:   Thrombocytopenia  Ane P:  Follow lABS  INFECTIOUS A:   Colitis P:   See abx  ENDOCRINE A:   No acute issues P:   monitor  NEUROLOGIC A:   Awake and alert P:   RASS goal: 0 Minimize sedtion   FAMILY   - Updates: updated at bedside  App CCT 57 min  Richardson Landry Esra Frankowski ACNP Maryanna Shape PCCM Pager [phone] till 3 pm If no answer page [phone] 08/04/2016, 3:02 PM

## 2016-08-04 NOTE — Care Management Note (Addendum)
Case Management Note  Patient Details  Name: Millicent Blazejewski MRN: [id number] Date of Birth: 1955/05/25  Subjective/Objective:  61 y.o. F evaluated by previous CM earlier in the week and found to have no HH needs and has Walker and cane at home. No needs at this time. Issues today requiring transfer to ICU for HD cath and CRRT. Will reevaluate when closer to discharge                  Action/Plan:CM will continue to follow closely for disposition/discharge needs.    Expected Discharge Date:                  Expected Discharge Plan:  Home/Self Care  In-House Referral:  NA  Discharge planning Services  CM Consult  Post Acute Care Choice:  NA Choice offered to:  NA  DME Arranged:    DME Agency:     HH Arranged:    HH Agency:     Status of Service:  In process, will continue to follow  If discussed at Long Length of Stay Meetings, dates discussed:    Additional Comments:  Delrae Sawyers, RN 08/04/2016, 8:48 AM

## 2016-08-04 NOTE — Progress Notes (Signed)
[facility] notified that patient had 3 beats VT bp 92/44 hr 62 asymptomatic. Also C/O bladder spasms states she has had them all night. Arthor Captain LPN

## 2016-08-04 NOTE — Progress Notes (Signed)
[facility] Rounding Note  Subjective/Interval History:   Stopped IVF last PM Trial of lasix/albumin - no sig UOP response Creatinine unchanged around 9 Worsening metabolic acidosis Still with low BP's despite midodrine - low 90's Massively overloaded Would be agreeable to ICU transfer for CRRT  I questioned husband/pt about the zaroxolyn she got recently - they both said it produced NO increase is UOP so can't really say was diuretic induced vol depletion (hemodynamic effects of ABLA more likely)  Tells me in no uncertain terms that she wants to live, even if it includes a trial of dialysis, not knowing if it will be tolerated  Objective Vital signs in last 24 hours: Vitals:   08/03/16 1300 08/03/16 1737 08/03/16 2138 08/04/16 0459  BP:  (!) 96/42 109/65 (!) 92/44  Pulse:  60 63 62  Resp:  '16 18 16  '$ Temp:  97.7 F (36.5 C) 97.7 F (36.5 C) 97.5 F (36.4 C)  TempSrc:  Oral Oral Oral  SpO2:  94% 100% 94%  Weight: 124 kg (273 lb 5.9 oz)     Height:        Intake/Output Summary (Last 24 hours) at 08/04/16 0837 Last data filed at 08/04/16 2595  Gross per 24 hour  Intake             1740 ml  Output              470 ml  Net             1270 ml   Physical Exam:  Blood pressure (!) 92/44, pulse 62, temperature 97.5 F (36.4 C), temperature source Oral, resp. rate 16, height '5\' 4"'$  (1.626 m), weight 124 kg (273 lb 5.9 oz), SpO2 94 %.   Morbidly obese/diffuse anasarca Not at all encephalopathic - alert, oriented, husband and pt with questions about her renal failure Normocephalic, without obvious abnormality, atraumatic Diminished breath sounds bilaterally no crackles Distant heart sounds, no rub, regular rate and rhythm Obese abdomen without focal tenderness + fluid wave Massive edema even of perineum and abd wall Extremities: anasarca to neck Surprisingly no asterixus   Recent Labs Lab 08/02/16 1900 08/03/16 0548 08/04/16 0438  NA 135 137 136  K 4.5 4.3 4.5  CL 110 110  109  CO2 16* 16* 14*  GLUCOSE 95 75 66  BUN 57* 59* 64*  CREATININE 9.61* 9.89* 9.92*  CALCIUM 7.7* 7.9* 8.0*  PHOS  --   --  7.9*    Recent Labs Lab 08/02/16 1900 08/03/16 0548 08/04/16 0438  AST 31 29  --   ALT 12* 14  --   ALKPHOS 83 80  --   BILITOT 1.2 1.3*  --   PROT 5.4* 5.2*  --   ALBUMIN 2.4* 2.6* 2.9*    Recent Labs Lab 08/03/16 0548 08/04/16 0430  AMMONIA 48* 74*     Recent Labs Lab 08/02/16 1900 08/03/16 0548 08/04/16 0438  WBC 5.5 5.2 5.4  HGB 7.7* 7.3* 7.4*  HCT 22.5* 21.4* 22.4*  MCV 90.4 90.7 91.4  PLT 94* 78* 90*    Medications:  . ciprofloxacin  400 mg Intravenous Q24H  . furosemide  160 mg Intravenous Q6H  . heparin  5,000 Units Subcutaneous Q8H  . lactulose  20 g Oral BID  . metronidazole  500 mg Intravenous Q8H  . midodrine  10 mg Oral TID WC  . nadolol  20 mg Oral QPM  . rifaximin  550 mg Oral BID  . sodium  chloride flush  3 mL Intravenous Q12H  . venlafaxine XR  75 mg Oral Daily   Background: PMH significant for advanced non-alcoholic cirrhosis, HTN, COPD, morbid obesity, depression, CKD stage 3-4, and recent admission to [facility] with colitis, who presented back to [place] on 07/31/16 with hematochezia and upon evaluation was noted to have AKI/CKD with BUN/Cr of 54/9.  This was felt to be due to ABLA as well as intravascular volume depletion after having zaroxolyn added to her outpatient diuretic regimen (which she said induced NO increase in UOP).  With IVF's Scr did not  improve and continued to range from 8.9 to 9.1 and was transferred to [facility] 3/29 for further evaluation and management of her AKI. Renal US at [facility] notable for small right Kidney of 8.1 cm with increased cortical echogenicity Left kidney 10.2 cm, with normal echogenicity.  No evidence of renal mass or obstruction.     Her husband tells me that [name] indicated she was ineligible for transplant unless she could lose 40 lb.      Assessment/Plan: 1.   AKI/CKD- multiple renal insults with cirrhosis, intravascular volume depletion (though by her report metolazone did not increase UOP at all), and ABLA with hypotension.  Hopefully this is just ischemic ATN and not hepatorenal syndrome.  Pt is a poor long-term dialysis candidate and is currently not a candidate for liver transplant due to her morbid obesity. However - tells me in no uncertain terms that she wants to proceed with a trial of dialysis if she needs it. She is failing diuretic challenge, BP remains low on midodrine. Given her hypotension CRRT is best bet for getting her volume down as we see if she will recover any renal function. Discussed with Dr. Chase Caller and with Dr. Wendee Beavers who will write transfer order. 2. Metabolic acidosis - worsening. 2/2 renal failure. Will correct with CRRT. 3. Cirrhosis (NASH)- as above.  Continue with lactulose and rifaximin. She is not at all encephalopathic at this time, despite rising ammonia. Need to clarify with [name] what her transplant eligibility status is. 4. ABLA- hematochezia related to recent colitis. Would benefit from PRBC's. Give 1 unit after she is transferred and gets started on CRRT. 5. CKD stage 3-4- unclear what her baseline Scr has been since September.  She denies being told that she has CKD. Renal US at [facility] showed atrophic right kidney. Need to contact [facility] to get baseline Scr (but their office is closed until Monday)  6. Colitis - recent Rx started PTA with cipro and flagyl.  7. HTN- low, hold nadalol 8. Morbid obesity- s/p gastric bypass surgery in 2010 9. Protein malnutrition 10. 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

## 2016-08-05 DIAGNOSIS — N170 Acute kidney failure with tubular necrosis: Principal | ICD-10-CM

## 2016-08-05 DIAGNOSIS — Z452 Encounter for adjustment and management of vascular access device: Secondary | ICD-10-CM

## 2016-08-05 LAB — RENAL FUNCTION PANEL
Albumin: 2.7 g/dL — ABNORMAL LOW (ref 3.5–5.0)
Albumin: 2.6 g/dL — ABNORMAL LOW (ref 3.5–5.0)
Anion gap: 9 (ref 5–15)
Anion gap: 9 (ref 5–15)
BUN: 49 mg/dL — AB (ref 6–20)
BUN: 39 mg/dL — ABNORMAL HIGH (ref 6–20)
CHLORIDE: 107 mmol/L (ref 101–111)
CO2: 16 mmol/L — ABNORMAL LOW (ref 22–32)
CO2: 18 mmol/L — AB (ref 22–32)
CREATININE: 6.47 mg/dL — AB (ref 0.44–1.00)
Calcium: 7.7 mg/dL — ABNORMAL LOW (ref 8.9–10.3)
Calcium: 7.5 mg/dL — ABNORMAL LOW (ref 8.9–10.3)
Chloride: 110 mmol/L (ref 101–111)
Creatinine, Ser: 7.55 mg/dL — ABNORMAL HIGH (ref 0.44–1.00)
GFR calc Af Amer: 6 mL/min — ABNORMAL LOW (ref >60)
GFR calc non Af Amer: 6 mL/min — ABNORMAL LOW (ref >60)
GFR, EST AFRICAN AMERICAN: 7 mL/min — AB (ref >60)
GFR, EST NON AFRICAN AMERICAN: 5 mL/min — AB (ref >60)
Glucose, Bld: 78 mg/dL (ref 65–99)
Glucose, Bld: 133 mg/dL — ABNORMAL HIGH (ref 65–99)
PHOSPHORUS: 5.7 mg/dL — AB (ref 2.5–4.6)
POTASSIUM: 4.1 mmol/L (ref 3.5–5.1)
POTASSIUM: 3.9 mmol/L (ref 3.5–5.1)
Phosphorus: 4.8 mg/dL — ABNORMAL HIGH (ref 2.5–4.6)
SODIUM: 134 mmol/L — AB (ref 135–145)
Sodium: 135 mmol/L (ref 135–145)

## 2016-08-05 LAB — CORTISOL: CORTISOL PLASMA: 9.3 ug/dL

## 2016-08-05 LAB — CBC
HCT: 23.5 % — ABNORMAL LOW (ref 36.0–46.0)
Hemoglobin: 8.1 g/dL — ABNORMAL LOW (ref 12.0–15.0)
MCH: 30.2 pg (ref 26.0–34.0)
MCHC: 34.5 g/dL (ref 30.0–36.0)
MCV: 87.7 fL (ref 78.0–100.0)
PLATELETS: 92 10*3/uL — AB (ref 150–400)
RBC: 2.68 MIL/uL — AB (ref 3.87–5.11)
RDW: 18.7 % — AB (ref 11.5–15.5)
WBC: 4.7 10*3/uL (ref 4.0–10.5)

## 2016-08-05 LAB — MAGNESIUM: MAGNESIUM: 2.1 mg/dL (ref 1.7–2.4)

## 2016-08-05 LAB — PREPARE RBC (CROSSMATCH)

## 2016-08-05 MED ORDER — CIPROFLOXACIN HCL 500 MG PO TABS
500.0000 mg | ORAL_TABLET | Freq: Every day | ORAL | Status: DC
  Administered 2016-08-05: 500 mg via ORAL
  Filled 2016-08-05: qty 1

## 2016-08-05 MED ORDER — ALBUMIN HUMAN 25 % IV SOLN
25.0000 g | Freq: Four times a day (QID) | INTRAVENOUS | Status: AC
  Administered 2016-08-05 – 2016-08-06 (×6): 25 g via INTRAVENOUS
  Filled 2016-08-05: qty 50
  Filled 2016-08-05 (×2): qty 100
  Filled 2016-08-05 (×4): qty 50

## 2016-08-05 MED ORDER — SODIUM CHLORIDE 0.9 % IV SOLN: Freq: Once | INTRAVENOUS | Status: DC

## 2016-08-05 MED ORDER — SODIUM CHLORIDE 0.9 % IV SOLN
0.0000 ug/min | INTRAVENOUS | Status: DC
  Administered 2016-08-05: 40 ug/min via INTRAVENOUS
  Administered 2016-08-10: 20 ug/min via INTRAVENOUS
  Filled 2016-08-05 (×2): qty 4

## 2016-08-05 MED ORDER — METRONIDAZOLE 500 MG PO TABS
500.0000 mg | ORAL_TABLET | Freq: Three times a day (TID) | ORAL | Status: DC
  Administered 2016-08-05 – 2016-08-06 (×3): 500 mg via ORAL
  Filled 2016-08-05 (×3): qty 1

## 2016-08-05 MED ORDER — BACLOFEN 5 MG HALF TABLET
5.0000 mg | ORAL_TABLET | Freq: Four times a day (QID) | ORAL | Status: DC | PRN
  Administered 2016-08-05 (×2): 5 mg via ORAL
  Filled 2016-08-05 (×3): qty 1

## 2016-08-05 NOTE — Progress Notes (Signed)
Pharmacy Antibiotic Note  Kamyra Schroeck is a 61 y.o. female admitted on 08/02/2016 with intra-abdominal infection.  Pharmacy has been consulted for cipro dosing.  Medical history significant of liver cirrhosis secondary to NASH, history of edema. She is being admitted after noted to have elevated Scr at primary care appointment. She is currently on day 15/14 of cipro/flagyl for colitis.   Plan: Change IV Cipro to PO - Ciprofloxacin 500 mg PO daily Change IV Metronidazole to PO - Metronidazole 500 mg every 8 hours Discontinue when able Follow renal function and plans for CRRT  Height: '5\' 4"'$  (162.6 cm) Weight: (!) 327 lb (148.3 kg) IBW/kg (Calculated) : 54.7  Temp (24hrs), Avg:97.5 F (36.4 C), Min:97.2 F (36.2 C), Max:97.9 F (36.6 C)   Recent Labs Lab 08/02/16 1900 08/03/16 0548 08/04/16 0438 08/04/16 1710 08/05/16 0349 08/05/16 0932  WBC 5.5 5.2 5.4  --   --  4.7  CREATININE 9.61* 9.89* 9.92* 9.05* 7.55*  --     Estimated Creatinine Clearance: 11.7 mL/min (A) (by C-G formula based on SCr of 7.55 mg/dL (H)).    Allergies  Allergen Reactions  . Codeine Other (See Comments)    Unknown  . Cymbalta [Duloxetine Hcl] Other (See Comments)    Unknown  . Levaquin [Levofloxacin In D5w] Other (See Comments)    Unknown  . Tomato Itching   Melburn Popper, PharmD Clinical Pharmacy Resident Pager: [phone] 08/05/16 12:47 PM

## 2016-08-05 NOTE — Progress Notes (Signed)
eLink Physician-Brief Progress Note Patient Name: Loretta [name] DOB: 1955-05-24 MRN: [id number]   Date of Service  08/05/2016  HPI/Events of Note  Bladder spasms  eICU Interventions  PRN baclofen     Intervention Category Minor Interventions: Routine modifications to care plan (e.g. PRN medications for pain, fever)  DETERDING,ELIZABETH 08/05/2016, 12:11 AM

## 2016-08-05 NOTE — Plan of Care (Signed)
Problem: Fluid Volume: Goal: Ability to maintain a balanced intake and output will improve Outcome: Progressing Initiation of CRRT per MD orders. Initial SBP and MAP low, CCM MD notified, orders received for Neosyn. Low dose Neosyn allowed for UF goal of -50 to 100 to be achieved.  Problem: Bowel/Gastric: Goal: Will not experience complications related to bowel motility Outcome: Progressing Pt having 3-4 soft BMs per shift.

## 2016-08-05 NOTE — Progress Notes (Signed)
Pt with c/o "bladder spasm", explained happens when foley cath in place. eCCM MD contacted as well as PharmD. Order received for Baclofin. Will continue to monitor and assess.

## 2016-08-05 NOTE — Progress Notes (Signed)
Medical management per Critical Care team. With improvement in condition may call our flow manager so that we can resume medical management.   Loretta [name], [employer]

## 2016-08-05 NOTE — Consult Note (Signed)
PULMONARY / CRITICAL CARE MEDICINE   Name: Loretta [name] MRN: [id number] DOB: 1956-01-03    ADMISSION DATE:  08/02/2016 CONSULTATION DATE: 3/30  REFERRING MD:  Renal  CHIEF COMPLAINT:  Renal failure  HISTORY OF PRESENT ILLNESS:   61 yo MO(273lbs) hx of gastric bypass, non acholic cirrhosis , acute on chronic renal dz, not a candidate for liver tx due to obesity, who is moved to ICU for HD and HD cath placement. PCCM asked to follow in ICU. She is awake and alert and in NAD. RIJ HD catheter was placed and HD to be instituted per renal.  SUBJECTIVE:  cvvhd Low ouput Neo needed  VITAL SIGNS: BP (!) 119/55   Pulse 65   Temp 97.5 F (36.4 C) (Oral)   Resp 17   Ht '5\' 4"'$  (1.626 m)   Wt (!) 148.3 kg (327 lb)   SpO2 100%   BMI 56.13 kg/m   HEMODYNAMICS:    VENTILATOR SETTINGS:    INTAKE / OUTPUT: I/O last 3 completed shifts: In: 2684.1 [P.O.:600; I.V.:354.1; Blood:330; IV Piggyback:1400] Out: 2467 [Urine:460; EPPIR:5188; Stool:151]  PHYSICAL EXAMINATION: General: awake, no distress Neuro: Inatct, nonfocal, perr HEENT:  obese Cardiovascular: s1 s2 RRB m Lungs:  Decreased in bases, ant cta Abdomen: Obese soft +bs Musculoskeletal:  intact Skin:  WDI ++edema  LABS:  BMET  Recent Labs Lab 08/04/16 0438 08/04/16 1710 08/05/16 0349  NA 136 133* 135  K 4.5 4.2 4.1  CL 109 109 110  CO2 14* 16* 16*  BUN 64* 61* 49*  CREATININE 9.92* 9.05* 7.55*  GLUCOSE 66 67 78    Electrolytes  Recent Labs Lab 08/04/16 0438 08/04/16 0450 08/04/16 1710 08/05/16 0349  CALCIUM 8.0*  --  7.9* 7.7*  MG  --  2.0  --  2.1  PHOS 7.9*  --  7.1* 5.7*    CBC  Recent Labs Lab 08/03/16 0548 08/04/16 0438 08/05/16 0932  WBC 5.2 5.4 4.7  HGB 7.3* 7.4* 8.1*  HCT 21.4* 22.4* 23.5*  PLT 78* 90* 92*    Coag's No results for input(s): APTT, INR in the last 168 hours.  Sepsis Markers No results for input(s): LATICACIDVEN, PROCALCITON, O2SATVEN in the last 168  hours.  ABG No results for input(s): PHART, PCO2ART, PO2ART in the last 168 hours.  Liver Enzymes  Recent Labs Lab 08/02/16 1900 08/03/16 0548 08/04/16 0438 08/04/16 1710 08/05/16 0349  AST 31 29  --   --   --   ALT 12* 14  --   --   --   ALKPHOS 83 80  --   --   --   BILITOT 1.2 1.3*  --   --   --   ALBUMIN 2.4* 2.6* 2.9* 2.8* 2.7*    Cardiac Enzymes No results for input(s): TROPONINI, PROBNP in the last 168 hours.  Glucose No results for input(s): GLUCAP in the last 168 hours.  Imaging Dg Chest Port 1 View  Result Date: 08/04/2016 CLINICAL DATA:  Encounter for central line placement, history hypertension, morbid obesity, COPD, chronic kidney disease EXAM: PORTABLE CHEST 1 VIEW COMPARISON:  Portable exam 1350 hours compared to 05/18/2016 FINDINGS: RIGHT jugular line with tip projecting over SVC. Borderline enlargement of cardiac silhouette. Mediastinal contours and pulmonary vascularity normal. Atelectasis versus infiltrate at medial RIGHT lung base. Remaining lungs clear. No pleural effusion or pneumothorax. Bones demineralized. IMPRESSION: No pneumothorax following central line placement. Atelectasis versus infiltrate in medial RIGHT lower lobe. Electronically Signed   By: Elta Guadeloupe  Thornton Papas M.D.   On: 08/04/2016 14:02     STUDIES:    CULTURES:   ANTIBIOTICS: 3/29 cipro>> 3/29 flagyl>> 3/29 Xifaxan>>  SIGNIFICANT EVENTS:   LINES/TUBES: 3/31 rt I j hd cath>>  DISCUSSION: 61 yo MO(273lbs) hx of gastric bypass, non acholic cirrhosis , acute on chronic renal dz, not a candidate for liver tx due to obesity, who is moved to ICU for HD and HD cath placement. PCCM asked to follow in ICU. She is awake and alert and in NAD. RIJ HD catheter was placed and HD to be instituted per renal.  ASSESSMENT / PLAN:  PULMONARY A: OSA COPD pulm edema  P:   PRM bipap, not being used or needed now Neg balance cvvhd  CARDIOVASCULAR A:  HTN Shock ( volume shifts, intravasc  status?) P:  Hold antihypertensives Neo to map goal Albumin, agree Cortisol Cbc to follow cvp needed Low threshold add vaso  RENAL Lab Results  Component Value Date   CREATININE 7.55 (H) 08/05/2016   CREATININE 9.05 (H) 08/04/2016   CREATININE 9.92 (H) 08/04/2016    A:   Acute on chronic kidney dz in setting of non alcoholic cirrhosis  metabolic acidosis P:   cvvhd  GASTROINTESTINAL A:   HX OF gib Prior colitis P:   Cbc ppi No bleeding noted abx  HEMATOLOGIC  Recent Labs  08/04/16 0438 08/05/16 0932  HGB 7.4* 8.1*    A:   Thrombocytopenia  anemia  P:  Follow lABS Cbc repeat up on own, hold blood  INFECTIOUS A:   Colitis P:   See abx will need to establish stop date  ENDOCRINE A:   r/o rel AI P:   Monitor, get cortisol cbg with am labs  NEUROLOGIC A:   Awake and alert At risk hepatic enceph P:   RASS goal: 0 Minimize sedtion, NO benzo rifax   FAMILY   - Updates: updated at bedside  Ccm time 30 min   Lavon Paganini. Titus Mould, MD, [place] Pgr: [facility]

## 2016-08-05 NOTE — Plan of Care (Signed)
Problem: Tissue Perfusion: Goal: Risk factors for ineffective tissue perfusion will decrease Outcome: Progressing SQ Heparin q8h  Problem: Fluid Volume: Goal: Ability to maintain a balanced intake and output will improve Outcome: Progressing CRRT removal rate increased to -160/FU with no complications. BP maintaining goal off of Neo.

## 2016-08-05 NOTE — Progress Notes (Signed)
[facility] Rounding Note  Subjective/Interval History:   Stopped IVF 3/30 Trial of lasix/albumin - no sig UOP response Creatinine remained unchanged around 9 Worsening metabolic acidosis Still with low BP's despite midodrine - low 90's Massively overloaded Agreed to ICU transfer for CRRT  Told me in no uncertain terms that she wants to live, even if it includes a trial of dialysis, not knowing if it will be tolerated  Line placed, requiring neo for BP support Weight today recorded at 327 lb (only other weight 273 while on 6E) Attempting to pull 50-100 CC/hour Transfused 1 unit PRBC's   Objective Vital signs in last 24 hours: Vitals:   08/05/16 0800 08/05/16 0815 08/05/16 0830 08/05/16 0845  BP: (!) 122/39 (!) 123/47 (!) 116/34 (!) 107/40  Pulse: (!) 58 (!) 55 (!) 54 (!) 55  Resp: '16 15 14 14  '$ Temp:      TempSrc:      SpO2: 100% 99% 100% 98%  Weight:      Height:        Intake/Output Summary (Last 24 hours) at 08/05/16 0850 Last data filed at 08/05/16 0800  Gross per 24 hour  Intake           1673.5 ml  Output             2147 ml  Net           -473.5 ml   Physical Exam:  Blood pressure (!) 107/40, pulse (!) 55, temperature 97.5 F (36.4 C), temperature source Oral, resp. rate 14, height '5\' 4"'$  (1.626 m), weight (!) 148.3 kg (327 lb), SpO2 98 %.   Morbidly obese/diffuse anasarca R IJ trialysis catheter (3/31) Fully awake, alert, showed me photos of grandchildren Remains alert, oriented, husband in room with her this AM Diminished breath sounds bilaterally no crackles Distant heart sounds, no rub, regular rate and rhythm Obese abdomen without focal tenderness + fluid wave Massive edema  With nasarca from neck to lower extremities even extending to perineum and abd wall No asterixus   Recent Labs Lab 08/02/16 1900 08/03/16 0548 08/04/16 0438 08/04/16 1710 08/05/16 0349  NA 135 137 136 133* 135  K 4.5 4.3 4.5 4.2 4.1  CL 110 110 109 109 110  CO2 16* 16* 14* 16*  16*  GLUCOSE 95 75 66 67 78  BUN 57* 59* 64* 61* 49*  CREATININE 9.61* 9.89* 9.92* 9.05* 7.55*  CALCIUM 7.7* 7.9* 8.0* 7.9* 7.7*  PHOS  --   --  7.9* 7.1* 5.7*    Recent Labs Lab 08/02/16 1900 08/03/16 0548 08/04/16 0438 08/04/16 1710 08/05/16 0349  AST 31 29  --   --   --   ALT 12* 14  --   --   --   ALKPHOS 83 80  --   --   --   BILITOT 1.2 1.3*  --   --   --   PROT 5.4* 5.2*  --   --   --   ALBUMIN 2.4* 2.6* 2.9* 2.8* 2.7*    Recent Labs Lab 08/03/16 0548 08/04/16 0430  AMMONIA 48* 74*     Recent Labs Lab 08/02/16 1900 08/03/16 0548 08/04/16 0438  WBC 5.5 5.2 5.4  HGB 7.7* 7.3* 7.4*  HCT 22.5* 21.4* 22.4*  MCV 90.4 90.7 91.4  PLT 94* 78* 90*    Medications: . phenylephrine (NEO-SYNEPHRINE) Adult infusion 25 mcg/min (08/05/16 0800)  . dialysis replacement fluid (prismasate) 300 mL/hr at 08/05/16 0732  . dialysis  replacement fluid (prismasate) 300 mL/hr at 08/05/16 0732  . dialysate (PRISMASATE) 1,000 mL/hr at 08/05/16 0515   . sodium chloride   Intravenous Once  . ciprofloxacin  400 mg Intravenous Q24H  . heparin  5,000 Units Subcutaneous Q8H  . lactulose  20 g Oral BID  . metronidazole  500 mg Intravenous Q8H  . midodrine  10 mg Oral TID WC  . rifaximin  550 mg Oral BID  . sodium chloride flush  10-40 mL Intracatheter Q12H  . sodium chloride flush  3 mL Intravenous Q12H  . venlafaxine XR  75 mg Oral Daily   Background: PMH of advanced non-alcoholic cirrhosis, HTN, COPD, morbid obesity, depression, CKD stage 3-4, and recent admission to [name] with colitis, presented back to [name] on 07/31/16 with hematochezia and was noted to have AKI/CKD with BUN/Cr of 54/9.  This was felt to be due to ABLA as well as intravascular volume depletion after having zaroxolyn added to her outpatient diuretic regimen (which she said induced NO increase in UOP).  Failed IVF's. Transferred to [name] 3/29 for AKI. Renal US at [name] notable for small right Kidney of  8.1 cm with increased cortical echogenicity Left kidney 10.2 cm, with normal echogenicity.  No evidence of renal mass or obstruction. Failed to diurese with albumin and lasix, BP remained low despite midodrine. Transferred to ICU 3/31 and CRRT initiated.    Her husband tells me that [name] indicated she was ineligible for transplant unless she could lose 40 lb.      Assessment/Plan: 1.  AKI/CKD- multiple renal insults with cirrhosis, intravascular volume depletion (though by her report metolazone did not increase UOP at all), and ABLA with hypotension.  Hopefully this is just ischemic ATN and not hepatorenal syndrome.  Pt is not a good long term HD candidate b/o low blood pressures and is currently not a candidate for liver transplant due to her morbid obesity. However - told me in no uncertain terms that she wanted to proceed with a trial of dialysis. Failed diuretic/alb challenge, BP remained low on midodrine. CRRT only short run option. Started 3/31. On neo for BP support. Probably has 50+lb fluid on board. Will try to pull net neg 100-150/hour, all 4K fluids/no heparin, titrate neo up as needed. Albumin Q6H to mobilize 3rd spaced fluids. 2. Metabolic acidosis - CRRT 3. Cirrhosis (NASH)- as above.  Lactulose and rifaximin. Not at all encephalopathic. Need to clarify with [name] what her transplant eligibility status was/is. 4. ABLA- hematochezia related to recent colitis. Had 1 unit 3/31 w/no bump in Hb. Transfuse 2nd unit today. 5. CKD stage 3-4- unclear what her baseline Scr has been since September.  She denies being told that she has CKD. Renal US at [name] showed atrophic right kidney. Need to contact [name] to get baseline Scr (but their office closed until Monday)  6. Colitis - recent Rx started PTA with cipro and flagyl. By report had GO blood loss associated. Abd not tender now.   7. HTN- low, hold nadalol 8. Morbid obesity- s/p gastric bypass surgery in 2010 9. Protein  malnutrition 10. 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

## 2016-08-06 ENCOUNTER — Inpatient Hospital Stay (HOSPITAL_COMMUNITY): Payer: Medicare HMO

## 2016-08-06 ENCOUNTER — Encounter (HOSPITAL_COMMUNITY): Payer: Self-pay | Admitting: *Deleted

## 2016-08-06 DIAGNOSIS — K7460 Unspecified cirrhosis of liver: Secondary | ICD-10-CM

## 2016-08-06 DIAGNOSIS — K7581 Nonalcoholic steatohepatitis (NASH): Secondary | ICD-10-CM

## 2016-08-06 DIAGNOSIS — N189 Chronic kidney disease, unspecified: Secondary | ICD-10-CM

## 2016-08-06 DIAGNOSIS — N179 Acute kidney failure, unspecified: Secondary | ICD-10-CM

## 2016-08-06 DIAGNOSIS — K529 Noninfective gastroenteritis and colitis, unspecified: Secondary | ICD-10-CM

## 2016-08-06 DIAGNOSIS — Z992 Dependence on renal dialysis: Secondary | ICD-10-CM

## 2016-08-06 LAB — RENAL FUNCTION PANEL
ALBUMIN: 3.9 g/dL (ref 3.5–5.0)
ANION GAP: 9 (ref 5–15)
Albumin: 3 g/dL — ABNORMAL LOW (ref 3.5–5.0)
Anion gap: 8 (ref 5–15)
BUN: 32 mg/dL — ABNORMAL HIGH (ref 6–20)
BUN: 25 mg/dL — AB (ref 6–20)
CALCIUM: 8.3 mg/dL — AB (ref 8.9–10.3)
CHLORIDE: 107 mmol/L (ref 101–111)
CO2: 22 mmol/L (ref 22–32)
CO2: 24 mmol/L (ref 22–32)
CREATININE: 5.34 mg/dL — AB (ref 0.44–1.00)
CREATININE: 4.48 mg/dL — AB (ref 0.44–1.00)
Calcium: 7.8 mg/dL — ABNORMAL LOW (ref 8.9–10.3)
Chloride: 104 mmol/L (ref 101–111)
GFR calc Af Amer: 11 mL/min — ABNORMAL LOW (ref >60)
GFR calc non Af Amer: 10 mL/min — ABNORMAL LOW (ref >60)
GFR, EST AFRICAN AMERICAN: 9 mL/min — AB (ref >60)
GFR, EST NON AFRICAN AMERICAN: 8 mL/min — AB (ref >60)
GLUCOSE: 86 mg/dL (ref 65–99)
Glucose, Bld: 77 mg/dL (ref 65–99)
PHOSPHORUS: 3.6 mg/dL (ref 2.5–4.6)
Phosphorus: 3.9 mg/dL (ref 2.5–4.6)
Potassium: 3.8 mmol/L (ref 3.5–5.1)
Potassium: 4 mmol/L (ref 3.5–5.1)
SODIUM: 137 mmol/L (ref 135–145)
Sodium: 137 mmol/L (ref 135–145)

## 2016-08-06 LAB — CBC WITH DIFFERENTIAL/PLATELET
Basophils Absolute: 0 10*3/uL (ref 0.0–0.1)
Basophils Relative: 1 %
Eosinophils Absolute: 0.5 10*3/uL (ref 0.0–0.7)
Eosinophils Relative: 9 %
HEMATOCRIT: 21.7 % — AB (ref 36.0–46.0)
Hemoglobin: 7.4 g/dL — ABNORMAL LOW (ref 12.0–15.0)
LYMPHS PCT: 25 %
Lymphs Abs: 1.2 10*3/uL (ref 0.7–4.0)
MCH: 30 pg (ref 26.0–34.0)
MCHC: 34.1 g/dL (ref 30.0–36.0)
MCV: 87.9 fL (ref 78.0–100.0)
Monocytes Absolute: 0.8 10*3/uL (ref 0.1–1.0)
Monocytes Relative: 15 %
NEUTROS ABS: 2.4 10*3/uL (ref 1.7–7.7)
NEUTROS PCT: 50 %
PLATELETS: 62 10*3/uL — AB (ref 150–400)
RBC: 2.47 MIL/uL — AB (ref 3.87–5.11)
RDW: 18.4 % — ABNORMAL HIGH (ref 11.5–15.5)
WBC: 4.9 10*3/uL (ref 4.0–10.5)

## 2016-08-06 LAB — MRSA PCR SCREENING: MRSA by PCR: NEGATIVE

## 2016-08-06 LAB — MAGNESIUM: Magnesium: 2.2 mg/dL (ref 1.7–2.4)

## 2016-08-06 MED ORDER — ACETAMINOPHEN 325 MG PO TABS
650.0000 mg | ORAL_TABLET | Freq: Four times a day (QID) | ORAL | Status: DC | PRN
  Administered 2016-08-06 – 2016-08-22 (×17): 650 mg via ORAL
  Filled 2016-08-06 (×17): qty 2

## 2016-08-06 MED ORDER — LACTULOSE 10 GM/15ML PO SOLN
20.0000 g | Freq: Two times a day (BID) | ORAL | Status: DC
  Administered 2016-08-06 – 2016-08-27 (×39): 20 g via ORAL
  Filled 2016-08-06 (×42): qty 30

## 2016-08-06 MED ORDER — CHLORHEXIDINE GLUCONATE CLOTH 2 % EX PADS
6.0000 | MEDICATED_PAD | Freq: Every day | CUTANEOUS | Status: DC
  Administered 2016-08-06 – 2016-08-20 (×11): 6 via TOPICAL

## 2016-08-06 NOTE — Plan of Care (Signed)
Problem: Nutrition: Goal: Adequate nutrition will be maintained Outcome: Progressing Pt taking adequate PO's with change of diet from renal 60-2-2 to regular diet. Pt consuming 75-100 % of meals.

## 2016-08-06 NOTE — Plan of Care (Signed)
Problem: Nutrition: Goal: Adequate nutrition will be maintained Outcome: Completed/Met Date Met: 08/06/16 Patient tolerating diet and eating majority of every meal.

## 2016-08-06 NOTE — Plan of Care (Signed)
Problem: Bowel/Gastric: Goal: Will not experience complications related to bowel motility Outcome: Progressing Pt has had multiple bowel movements today and yesterday.

## 2016-08-06 NOTE — Plan of Care (Signed)
Problem: Pain Managment: Goal: General experience of comfort will improve Outcome: Progressing Patient complaining of headache, PRN medication ordered and has helped decrease pain

## 2016-08-06 NOTE — Progress Notes (Signed)
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 Her renal function did not improve with IV fluids, albumin, and midodrine on the floor.  PAST MEDICAL HISTORY :  Past Medical History:  Diagnosis Date  . CKD (chronic kidney disease) stage 4, GFR 15-29 ml/min (HCC) 01/2016  . COPD (chronic obstructive pulmonary disease) ([place])   . Depression   . Hypertension   . Liver cirrhosis secondary to NASH ([place])   . Morbid obesity ([place])   . Nephrolithiasis   . Thrombocytopenia ([place])    Past Surgical History:  Procedure Laterality Date  . CHOLECYSTECTOMY    . ESOPHAGOGASTRODUODENOSCOPY N/A 01/16/2016   Procedure: ESOPHAGOGASTRODUODENOSCOPY (EGD);  Surgeon: Clarene Essex, MD;  Location: [facility] ENDOSCOPY;  Service: Endoscopy;  Laterality: N/A;  . GASTRIC BYPASS     Prior to Admission medications   Medication Sig Start Date End Date Taking? Authorizing Provider  docusate sodium (COLACE) 100 MG capsule Take 100 mg by mouth daily as needed for moderate constipation.    Yes Historical Provider, MD  folic acid (FOLVITE) 1 MG tablet Take 1 mg by mouth daily.   Yes Historical Provider, MD  furosemide (LASIX) 20 MG tablet Take 1 tablet (20 mg total) by mouth 2 (two) times daily. Restart in 5-6days after FU  with PCP 01/21/16  Yes Domenic Polite, MD  KRISTALOSE 20 g packet Take 20 g by mouth 3 (three) times daily.  11/14/15  Yes Historical Provider, MD  Melatonin 3 MG TABS Take 3 mg by mouth at bedtime.   Yes Historical Provider, MD  metolazone (ZAROXOLYN) 5 MG tablet Take 5 mg by mouth 3 (three) times a week. 07/23/16  Yes Historical Provider, MD  milk thistle 175 MG tablet Take 175 mg by mouth 2 (two) times daily.   Yes Historical Provider, MD  Multiple Vitamin (MULTI-VITAMIN PO) Take 1 tablet by mouth daily.   Yes Historical Provider, MD  nadolol (CORGARD) 40 MG tablet Take 40 mg by mouth 2 (two) times daily.  12/23/15  Yes Historical Provider, MD  pantoprazole (PROTONIX) 40 MG tablet Take 1 tablet (40 mg total) by mouth 2 (two) times daily before a meal. 01/21/16  Yes Domenic Polite, MD  pramipexole (MIRAPEX) 0.25 MG tablet Take 0.25 mg by mouth 2 (two) times daily. 11/18/15  Yes Historical Provider, MD  Probiotic Product (PROBIOTIC PO) Take 1 tablet by mouth daily.   Yes Historical Provider, MD  spironolactone (ALDACTONE) 50 MG tablet Take 1 tablet (50 mg total) by mouth daily. Restart in 5-6days after FU with PCP Patient taking differently: Take 25 mg by mouth daily. Restart in 5-6days after FU with PCP 01/21/16  Yes Domenic Polite, MD  venlafaxine XR (EFFEXOR-XR) 75 MG 24 hr capsule Take  75 mg by mouth daily with breakfast.   Yes Historical Provider, MD  VENTOLIN HFA 108 (90 Base) MCG/ACT inhaler Inhale 2 puffs into the lungs every 4 (four) hours as needed for wheezing or shortness of breath.  07/25/16  Yes Historical Provider, MD  XIFAXAN 550 MG TABS tablet Take 550 mg by mouth 2 (two) times daily. 12/23/15  Yes Historical Provider, MD   Allergies  Allergen Reactions  . Codeine Other (See Comments)    Unknown  . Cymbalta [Duloxetine Hcl] Other (See Comments)    Unknown  . Levaquin [Levofloxacin In D5w] Other (See Comments)    Unknown  . Tomato Itching    FAMILY HISTORY:  Family History   Problem Relation Age of Onset  . Throat cancer Father   . Lung cancer Brother    SOCIAL HISTORY:  reports that she has quit smoking. She has never used smokeless tobacco. She reports that she does not drink alcohol or use drugs.  HPI:  She has no acute complaints this morning, like abdominal pain, difficulty breathing though complains of headache and neck pain.  VITAL SIGNS: Temp:  [97.3 F (36.3 C)-97.7 F (36.5 C)] 97.6 F (36.4 C) (04/02 0730) Pulse Rate:  [35-68] 68 (04/02 0930) Resp:  [8-37] 17 (04/02 0930) BP: (91-139)/(34-64) 122/50 (04/02 0930) SpO2:  [95 %-100 %] 100 % (04/02 0930) Weight:  [323 lb (146.5 kg)] 323 lb (146.5 kg) (04/02 0600) HEMODYNAMICS: CVP:  [11 mmHg-21 mmHg] 21 mmHg VENTILATOR SETTINGS:   INTAKE / OUTPUT: Intake/Output      04/01 0701 - 04/02 0700 04/02 0701 - 04/03 0700   P.O. 1020 300   I.V. (mL/kg) 60.2 (0.4)    Blood     IV Piggyback 300 100   Total Intake(mL/kg) 1380.2 (9.4) 400 (2.7)   Urine (mL/kg/hr) 60 (0) 30 (0.1)   Other 4082 (1.2) 694 (1.5)   Stool 300 (0.1)    Total Output 4442 724   Net -3061.8 -324        Stool Occurrence 7 x      PHYSICAL EXAMINATION: Physical Exam  Constitutional: She is oriented to person, place, and time. No distress.  HENT:  Head: Normocephalic and atraumatic.  Eyes: Conjunctivae are normal. No scleral icterus.  Neck:  Right IJ catheter in place  Cardiovascular:  Distant heart sounds  Pulmonary/Chest: No respiratory distress.  Abdominal: Soft. There is no tenderness.  Musculoskeletal: She exhibits edema (2-3+ in bilateral lower extremities).  Neurological: She is alert and oriented to person, place, and time.  Skin: She is not diaphoretic.     LABS: Cbc  Recent Labs Lab 08/04/16 0438 08/05/16 0932 08/06/16 0326  WBC 5.4 4.7 4.9  HGB 7.4* 8.1* 7.4*  HCT 22.4* 23.5* 21.7*  PLT 90* 92* 62*    Chemistry   Recent Labs Lab 08/04/16 0450  08/05/16 0349 08/05/16 1620  08/06/16 0326  NA  --   < > 135 134* 137  K  --   < > 4.1 3.9 3.8  CL  --   < > 110 107 107  CO2  --   < > 16* 18* 22  BUN  --   < > 49* 39* 32*  CREATININE  --   < > 7.55* 6.47* 5.34*  CALCIUM  --   < > 7.7* 7.5* 7.8*  MG 2.0  --  2.1  --  2.2  PHOS  --   < > 5.7* 4.8* 3.9  GLUCOSE  --   < >  78 133* 77  < > = values in this interval not displayed.  Liver fxn  Recent Labs Lab 08/02/16 1900 08/03/16 0548  08/05/16 0349 08/05/16 1620 08/06/16 0326  AST 31 29  --   --   --   --   ALT 12* 14  --   --   --   --   ALKPHOS 83 80  --   --   --   --   BILITOT 1.2 1.3*  --   --   --   --   PROT 5.4* 5.2*  --   --   --   --   ALBUMIN 2.4* 2.6*  < > 2.7* 2.6* 3.0*  < > = values in this interval not displayed.   DIAGNOSES: Active Problems:   Liver cirrhosis secondary to NASH (HCC)   Acute on chronic renal failure (HCC)   ARF (acute renal failure) (HCC)   Colitis   Hypertension   COPD (chronic obstructive pulmonary disease) (HCC)   Nephrolithiasis   Depression   Thrombocytopenia (HCC)   Morbid obesity (HCC)   CKD (chronic kidney disease) stage 4, GFR 15-29 ml/min (HCC)   Hypotension   ASSESSMENT / PLAN:  PULMONARY  ASSESSMENT:  OSA COPD  PLAN:   Use BiPAP as needed Aim for negative fluid balance with CRRT. Net -2.5L since admission.   CARDIOVASCULAR  ASSESSMENT:  History of hypertension: BP mostly 100s/30s-40s off anti-hypertensive therapy. MAP this morning 65-70.   PLAN:  -Continue midodrine 10 mg three times daily with meals -Continue albumin 25 g every 6 hours -Consider Neo for goal MAP >65   RENAL  ASSESSMENT:   Acute on chronic kidney disease Stage 3: ATN vs. Hepatorenal in the setting of acute blood loss anemia, hypovolemia, and intermittent hypotension. Last creatinine 1.8 at her GI office visit 05/22/16. Unclear if ciprofloxacin may have been contributory as she had been on this medication for 12 days prior to admission with presenting creatinine  9.61.  PLAN:   -CRRT per Renal   GASTROINTESTINAL  ASSESSMENT:   NASH cirrhosis with h/o GI bleeding: Per her last GI note 05/22/16 available in Care Everywhere, MELD score increase 15 to 20 was attributed to increase in creatinine and INR. Liver transplant was felt less likely, BMI<40 was indicated as goal for liver transplant.   PLAN:   -Continue rifaximin 500 mg twice daily and lactulose 20 mg twice daily with meals   HEMATOLOGIC  ASSESSMENT:   Thrombocytopenia: 62 this morning, down from baseline 90s-100s.  Chronic normocytic anemia: Baseline Hb 7-8. s/p pRBC x 1.  PLAN:  Follow CBC   INFECTIOUS:  ASSESSMENT:  Colitis s/p 16 days of antibiotics  PLAN:   -Discontinue ciprofloxacin and metronidazole as colitis would be expected be treated at this point.   ENDOCRINE  ASSESSMENT:   No active problems  PLAN:   Continue observing.   NEUROLOGIC  ASSESSMENT:   Depression and anxiety  PLAN:   Continue venlafaxine 75 mg daily   CLINICAL SUMMARY: Ms. Seefeld is a 61 year old woman with h/o gastric bypass, obesity, NASH cirrhosis complicated by acute kidney disease obesity transferred to the ICU for HD initiation.   Charlott Rakes, PGY3 Internal Medicine Pager: [phone] 08/06/2016, 10:16 AM    Attending Note:  I have examined patient, reviewed labs, studies and notes. I have discussed the case with Dr. Posey Pronto, and I agree with the data and plans as amended above. 61 year old woman with a history of nonalcoholic cirrhosis,  obesity with obesity hyperventilation syndrome, chronic kidney disease. Admitted with a recent colitis associated with hematochezia. Unfortunately developed acute on chronic renal failure likely due to ATN in the setting of intravascular volume depletion, bleeding, sepsis. Certainly at some risk for hepatorenal involvement but suspect that this was an acute insult. She was moved to the ICU for continuous dialysis and volume removal. On my  evaluation she is comfortable obese woman lying in bed. She is awake and interacting. CVVHD is running. Her lungs are distant but clear. Heart regular. We will plan to continue CVVHD, volume removal as she can tolerate. Suspect that she will need several days of continuous dialysis in order to achieve optimal fluid status. Currently requiring pressors in order to tolerate. We will continue her Midodrine, continue albumin and wean phenylephrine as able. Continue BiPAP when necessary. Suspect that she may actually need this daily at bedtime. Stop antibiotics that she has been treated for possible colitis for the last 16 days.  Independent critical care time is 35 minutes.   Baltazar Apo, MD, PhD 08/06/2016, 2:07 PM [facility] [phone] or if no answer [phone]

## 2016-08-06 NOTE — Plan of Care (Signed)
Problem: Activity: Goal: Risk for activity intolerance will decrease Outcome: Progressing Pt remains on bedrest d/t CRRT running. Able to turn self in bed and assist with bedpan. No signs of skin breakdown, sacral pad in place.  Problem: Fluid Volume: Goal: Ability to maintain a balanced intake and output will improve Outcome: Progressing Pt hemodynamically tolerating increase in UF ordered and weaning off of Neosyn.

## 2016-08-06 NOTE — Progress Notes (Signed)
Subjective Interval History: has complaints HA.  Objective: Vital signs in last 24 hours: Temp:  [97.3 F (36.3 C)-97.7 F (36.5 C)] 97.6 F (36.4 C) (04/02 0730) Pulse Rate:  [35-68] 63 (04/02 0730) Resp:  [8-37] 17 (04/02 0730) BP: (91-139)/(34-64) 104/35 (04/02 0730) SpO2:  [95 %-100 %] 100 % (04/02 0730) Weight:  [146.5 kg (323 lb)] 146.5 kg (323 lb) (04/02 0600) Weight change: -1.814 kg (-4 lb)  Intake/Output from previous day: 04/01 0701 - 04/02 0700 In: 1380.2 [P.O.:1020; I.V.:60.2; IV Piggyback:300] Out: 4442 [Urine:60; Stool:300] Intake/Output this shift: No intake/output data recorded.  General appearance: alert, cooperative, morbidly obese and pale Neck: RIJ cath Resp: diminished breath sounds bilaterally and rales bibasilar Cardio: S1, S2 normal and systolic murmur: holosystolic 2/6, blowing at apex GI: massive, pos bs. liver down 6 cm Extremities: edema 4+  Lab Results:  Recent Labs  08/05/16 0932 08/06/16 0326  WBC 4.7 4.9  HGB 8.1* 7.4*  HCT 23.5* 21.7*  PLT 92* 62*   BMET:  Recent Labs  08/05/16 1620 08/06/16 0326  NA 134* 137  K 3.9 3.8  CL 107 107  CO2 18* 22  GLUCOSE 133* 77  BUN 39* 32*  CREATININE 6.47* 5.34*  CALCIUM 7.5* 7.8*   No results for input(s): PTH in the last 72 hours. Iron Studies: No results for input(s): IRON, TIBC, TRANSFERRIN, FERRITIN in the last 72 hours.  Studies/Results: Dg Chest Port 1 View  Result Date: 08/06/2016 CLINICAL DATA:  Acute renal failure EXAM: PORTABLE CHEST 1 VIEW COMPARISON:  08/04/2016 FINDINGS: Right jugular central venous catheter is been pulled back into the jugular vein. Progression of vascular congestion and bilateral airspace disease most compatible with mild edema. No effusion. IMPRESSION: Right jugular catheter has been pulled back with the tip now in the lower jugular vein Progression of bilateral airspace disease and vascular congestion most likely due to edema versus pneumonia.  Electronically Signed   By: Franchot Gallo M.D.   On: 08/06/2016 07:14   Dg Chest Port 1 View  Result Date: 08/04/2016 CLINICAL DATA:  Encounter for central line placement, history hypertension, morbid obesity, COPD, chronic kidney disease EXAM: PORTABLE CHEST 1 VIEW COMPARISON:  Portable exam 1350 hours compared to 05/18/2016 FINDINGS: RIGHT jugular line with tip projecting over SVC. Borderline enlargement of cardiac silhouette. Mediastinal contours and pulmonary vascularity normal. Atelectasis versus infiltrate at medial RIGHT lung base. Remaining lungs clear. No pleural effusion or pneumothorax. Bones demineralized. IMPRESSION: No pneumothorax following central line placement. Atelectasis versus infiltrate in medial RIGHT lower lobe. Electronically Signed   By: Lavonia Dana M.D.   On: 08/04/2016 14:02    I have reviewed the patient's current medications.  Assessment/Plan: 1 AKI HRS vs ATN.  Massive vol xs.  Little urine.  Solute/acid/base/K improving.   2 Liver disease 3 Massive obesity 4 Anemia stable 5 DM 6 OSA P CRRt,. Lower vol , solute, cont alb for 48h , mido.   LOS: 4 days   Donnavin Vandenbrink L 08/06/2016,8:05 AM

## 2016-08-07 LAB — RENAL FUNCTION PANEL
ALBUMIN: 3.6 g/dL (ref 3.5–5.0)
ANION GAP: 6 (ref 5–15)
Albumin: 3.5 g/dL (ref 3.5–5.0)
Anion gap: 3 — ABNORMAL LOW (ref 5–15)
BUN: 19 mg/dL (ref 6–20)
BUN: 16 mg/dL (ref 6–20)
CHLORIDE: 104 mmol/L (ref 101–111)
CO2: 26 mmol/L (ref 22–32)
CO2: 26 mmol/L (ref 22–32)
Calcium: 8.2 mg/dL — ABNORMAL LOW (ref 8.9–10.3)
Calcium: 8.2 mg/dL — ABNORMAL LOW (ref 8.9–10.3)
Chloride: 106 mmol/L (ref 101–111)
Creatinine, Ser: 3.6 mg/dL — ABNORMAL HIGH (ref 0.44–1.00)
Creatinine, Ser: 3.32 mg/dL — ABNORMAL HIGH (ref 0.44–1.00)
GFR calc Af Amer: 15 mL/min — ABNORMAL LOW (ref >60)
GFR calc non Af Amer: 13 mL/min — ABNORMAL LOW (ref >60)
GFR calc non Af Amer: 14 mL/min — ABNORMAL LOW (ref >60)
GFR, EST AFRICAN AMERICAN: 16 mL/min — AB (ref >60)
GLUCOSE: 78 mg/dL (ref 65–99)
GLUCOSE: 87 mg/dL (ref 65–99)
PHOSPHORUS: 2.9 mg/dL (ref 2.5–4.6)
POTASSIUM: 3.8 mmol/L (ref 3.5–5.1)
POTASSIUM: 3.9 mmol/L (ref 3.5–5.1)
Phosphorus: 2.7 mg/dL (ref 2.5–4.6)
SODIUM: 136 mmol/L (ref 135–145)
Sodium: 135 mmol/L (ref 135–145)

## 2016-08-07 LAB — CBC
HEMATOCRIT: 20.9 % — AB (ref 36.0–46.0)
HEMOGLOBIN: 7.1 g/dL — AB (ref 12.0–15.0)
MCH: 30.2 pg (ref 26.0–34.0)
MCHC: 34 g/dL (ref 30.0–36.0)
MCV: 88.9 fL (ref 78.0–100.0)
Platelets: 58 10*3/uL — ABNORMAL LOW (ref 150–400)
RBC: 2.35 MIL/uL — AB (ref 3.87–5.11)
RDW: 18.6 % — ABNORMAL HIGH (ref 11.5–15.5)
WBC: 3.4 10*3/uL — ABNORMAL LOW (ref 4.0–10.5)

## 2016-08-07 LAB — MAGNESIUM: Magnesium: 2.4 mg/dL (ref 1.7–2.4)

## 2016-08-07 MED ORDER — TRAMADOL HCL 50 MG PO TABS
50.0000 mg | ORAL_TABLET | Freq: Two times a day (BID) | ORAL | Status: DC | PRN
Start: 2016-08-07 — End: 2016-08-09
  Administered 2016-08-07 – 2016-08-09 (×2): 50 mg via ORAL
  Filled 2016-08-07 (×2): qty 1

## 2016-08-07 MED ORDER — GERHARDT'S BUTT CREAM
TOPICAL_CREAM | Freq: Three times a day (TID) | CUTANEOUS | Status: DC
  Administered 2016-08-07 – 2016-08-10 (×11): via TOPICAL
  Administered 2016-08-11: 1 via TOPICAL
  Administered 2016-08-12 (×3): via TOPICAL
  Administered 2016-08-13: 1 via TOPICAL
  Administered 2016-08-13: 16:00:00 via TOPICAL
  Administered 2016-08-13: 1 via TOPICAL
  Administered 2016-08-14 – 2016-08-19 (×10): via TOPICAL
  Administered 2016-08-19: 1 via TOPICAL
  Administered 2016-08-20 – 2016-08-26 (×7): via TOPICAL
  Filled 2016-08-07 (×4): qty 1

## 2016-08-07 MED ORDER — TRAMADOL HCL 50 MG PO TABS
50.0000 mg | ORAL_TABLET | Freq: Two times a day (BID) | ORAL | Status: DC
  Administered 2016-08-07 (×2): 50 mg via ORAL
  Filled 2016-08-07 (×2): qty 1

## 2016-08-07 NOTE — Progress Notes (Signed)
eLink Physician-Brief Progress Note Patient Name: Loretta [name] DOB: [birth year] MRN: [id number]   Date of Service  08/07/2016  HPI/Events of Note  Headache, neck pain, APAP not helping CRRT, hepatic failure  eICU Interventions  Tramadol prn     Intervention Category Intermediate Interventions: Pain - evaluation and management  Simonne Maffucci 08/07/2016, 12:42 AM

## 2016-08-07 NOTE — Significant Event (Signed)
Made aware to MD regarding low MAP <60 in the past few hours. Per Dr Deterding, to go by SBP >90. Per MD, can start Neo to keep SBP > 90 if needed.        Loretta [name]

## 2016-08-07 NOTE — Progress Notes (Signed)
Subjective: Interval History: has complaints HA.  Objective: Vital signs in last 24 hours: Temp:  [97.3 F (36.3 C)-98.3 F (36.8 C)] 97.5 F (36.4 C) (04/03 0400) Pulse Rate:  [49-76] 57 (04/03 0700) Resp:  [12-20] 14 (04/03 0700) BP: (91-131)/(30-70) 105/40 (04/03 0700) SpO2:  [91 %-100 %] 94 % (04/03 0700) Weight:  [144.7 kg (319 lb)] 144.7 kg (319 lb) (04/03 0443) Weight change: -1.814 kg (-4 lb)  Intake/Output from previous day: 04/02 0701 - 04/03 0700 In: 1050 [P.O.:850; IV Piggyback:200] Out: 4773 [Urine:30; Stool:875] Intake/Output this shift: No intake/output data recorded.  General appearance: alert, cooperative, morbidly obese and pale Neck: IJ cath Resp: IJ cath Cardio: S1, S2 normal and systolic murmur: holosystolic 2/6, blowing at apex GI: massive , abdm wall edema,  pos bs, soft Extremities: edema 4+  Lungs decreased bs  Lab Results:  Recent Labs  08/06/16 0326 08/07/16 0410  WBC 4.9 3.4*  HGB 7.4* 7.1*  HCT 21.7* 20.9*  PLT 62* 58*   BMET:  Recent Labs  08/06/16 1600 08/07/16 0410  NA 137 135  K 4.0 3.8  CL 104 106  CO2 24 26  GLUCOSE 86 78  BUN 25* 19  CREATININE 4.48* 3.60*  CALCIUM 8.3* 8.2*   No results for input(s): PTH in the last 72 hours. Iron Studies: No results for input(s): IRON, TIBC, TRANSFERRIN, FERRITIN in the last 72 hours.  Studies/Results: Dg Chest Port 1 View  Result Date: 08/06/2016 CLINICAL DATA:  Acute renal failure EXAM: PORTABLE CHEST 1 VIEW COMPARISON:  08/04/2016 FINDINGS: Right jugular central venous catheter is been pulled back into the jugular vein. Progression of vascular congestion and bilateral airspace disease most compatible with mild edema. No effusion. IMPRESSION: Right jugular catheter has been pulled back with the tip now in the lower jugular vein Progression of bilateral airspace disease and vascular congestion most likely due to edema versus pneumonia. Electronically Signed   By: Franchot Gallo M.D.    On: 08/06/2016 07:14    I have reviewed the patient's current medications.  Assessment/Plan: 1 AKI, CKD3 oliguric , apparently hemorrhagic schock as cause.  Severe vol xs , good solute/acid/base/K clearance. 2 Cirrhosis cannot r/o HRS 3 Anemia 4  Low ptlts  5 OSA 6 massive obesity P CRRT, lower vol, follow ptlt    LOS: 5 days   Maytal Mijangos L 08/07/2016,8:02 AM

## 2016-08-07 NOTE — Progress Notes (Signed)
PULMONARY  / CRITICAL CARE MEDICINE  Name: Loretta [name] MRN: [id number] DOB: [birth year]    LOS: 5  REFERRING MD :  Nephrology  CHIEF COMPLAINT:  Renal failure  BRIEF PATIENT DESCRIPTION: Ms. 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   LINES / TUBES: Right IJ HD catheter 3/31  SIGNIFICANT EVENTS:  3/29 >> admitted 4/1 >> transfer to ICU to start CRRT  DIET:  Renal  DVT Px:  Heparin subQ  GI Px:  None  HISTORY OF PRESENT ILLNESS:  Ms. 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 Her renal function did not improve with IV fluids, albumin, and midodrine on the floor.  INTERVAL HISTORY:  This morning, she complained of a headache she had in the morning which responded to tramadol. We revisited her hospital course and while we would hope for renal recovery, we also explored her perception of intermittent HD indefinitely. She valued the time she spends with the family and feels the latter would afford her this opportunity should she ultimately have to need it.  VITAL SIGNS: Temp:  [97.3 F (36.3 C)-98.3 F (36.8 C)] 97.4 F (36.3 C) (04/03 0800) Pulse Rate:  [49-72] 72 (04/03 1000) Resp:  [12-20] 18 (04/03 1000) BP: (92-131)/(30-70) 102/37 (04/03 1000) SpO2:  [91 %-100 %] 100 % (04/03 1000) Weight:  [319 lb (144.7 kg)] 319 lb (144.7 kg) (04/03 0443) HEMODYNAMICS: CVP:  [18 mmHg-27 mmHg] 18 mmHg   INTAKE / OUTPUT: Intake/Output      04/02 0701 - 04/03 0700 04/03 0701 - 04/04 0700   P.O. 850 240   I.V. (mL/kg)     Other  0   IV Piggyback 200    Total Intake(mL/kg) 1050 (7.3) 240 (1.7)   Urine (mL/kg/hr) 30 (0) 0 (0)   Other 3868 (1.1) 360 (0.5)   Stool 875 (0.3) 150 (0.2)   Total Output 4773 510   Net -3723 -270        Stool  Occurrence 5 x 1 x     PHYSICAL EXAMINATION: Physical Exam  Constitutional: She is oriented to person, place, and time. No distress.  HENT:  Head: Normocephalic and atraumatic.  Eyes: Conjunctivae are normal. No scleral icterus.  Cardiovascular: Normal rate and regular rhythm.   Pulmonary/Chest: Effort normal. No respiratory distress.  Abdominal: Soft. She exhibits no distension.  Musculoskeletal: She exhibits edema (2-3+ pitting in the lower extremities).  Neurological: She is alert and oriented to person, place, and time.  Skin: She is not diaphoretic.     LABS: Cbc  Recent Labs Lab 08/05/16 0932 08/06/16 0326 08/07/16 0410  WBC 4.7 4.9 3.4*  HGB 8.1* 7.4* 7.1*  HCT 23.5* 21.7* 20.9*  PLT 92* 62* 58*    Chemistry   Recent Labs Lab 08/05/16 0349  08/06/16 0326 08/06/16 1600 08/07/16 0410  NA 135  < > 137 137 135  K 4.1  < > 3.8 4.0 3.8  CL 110  < > 107 104 106  CO2 16*  < > '22 24 26  '$ BUN 49*  < > 32* 25* 19  CREATININE 7.55*  < > 5.34* 4.48* 3.60*  CALCIUM 7.7*  < > 7.8* 8.3* 8.2*  MG 2.1  --  2.2  --  2.4  PHOS 5.7*  < >  3.9 3.6 2.9  GLUCOSE 78  < > 77 86 78  < > = values in this interval not displayed.  Liver fxn  Recent Labs Lab 08/02/16 1900 08/03/16 0548  08/06/16 0326 08/06/16 1600 08/07/16 0410  AST 31 29  --   --   --   --   ALT 12* 14  --   --   --   --   ALKPHOS 83 80  --   --   --   --   BILITOT 1.2 1.3*  --   --   --   --   PROT 5.4* 5.2*  --   --   --   --   ALBUMIN 2.4* 2.6*  < > 3.0* 3.9 3.6  < > = values in this interval not displayed.   DIAGNOSES: Active Problems:   Liver cirrhosis secondary to NASH (HCC)   Acute on chronic renal failure (HCC)   ARF (acute renal failure) (HCC)   Colitis   Hypertension   COPD (chronic obstructive pulmonary disease) (HCC)   Nephrolithiasis   Depression   Thrombocytopenia (HCC)   Morbid obesity (HCC)   CKD (chronic kidney disease) stage 4, GFR 15-29 ml/min (HCC)    Hypotension   ASSESSMENT / PLAN:  PULMONARY  ASSESSMENT:  OSA COPD  PLAN:   -Use BiPAP as needed.   CARDIOVASCULAR  ASSESSMENT:  Hypertension: BP 91/57-131/45 over the last 24 hours with MAP mostly in the 50s since this morning.  PLAN:  -Continue midodrine 10 mg three times daily with meals -Consider phenylephrine to maintain MAP > 65   RENAL  ASSESSMENT:   Acute on chronic kidney disease Stage 3: ATN vs. Hepatorenal in the setting of acute blood loss anemia from hematochezia, hypovolemia, and intermittent hypotension. Last creatinine 1.8 at her GI office visit 05/22/16.   PLAN:   -Continue CRRT per Renal recommendations.    GASTROINTESTINAL  ASSESSMENT:   NASH cirrhosis with vacriees and h/o GI bleeding   PLAN:   -Continue rifaximin 500 mg twice daily and lactulose 20 mg twice daily though may reduce if >3 bowel movements daily   HEMATOLOGIC  ASSESSMENT:   Thrombocytopenia: 58 this morning, down from baseline 90s-100s.  Chronic normocytic anemia: Baseline Hb 7-8. s/p pRBC x 1.  PLAN:  -Recheck CBC   NEUROLOGIC  ASSESSMENT:   Depression and anxiety Headache  PLAN:   -Continue venlafaxine 75 mg daily -Continue tramadol 50 every 12 hours as acetaminophen was not helpful.    Loretta [name], PGY3 Internal Medicine Pager: [phone] 08/07/2016, 11:54 AM   Attending Note:  I have examined patient, reviewed labs, studies and notes. I have discussed the case with Dr Posey Pronto, and I agree with the data and plans as amended above. 61 year old obese woman with a history of nonalcoholic cirrhosis, OSA/OHS, chronic renal failure. She was admitted with colitis and hematochezia, hemorrhagic shock, presumed septic shock, resulting in acute on chronic renal failure. Hopefully due to ATN but concerning for possible evolving hepatorenal syndrome. She started continuous hemodialysis for volume removal and stabilization. She has intermittently been on phenylephrine for  blood pressure support in order to tolerate this but currently is not requiring. On my evaluation she is comfortable, lying in bed with CVVH running. Her lungs are distant, clear anteriorly but with some decreased basilar breath sounds. Heart regular without a murmur, she has 2+ pitting edema to the mid thigh. Goal is to achieve volume removal and then discontinue CVVH to see if she  can achieve renal recovery. We discussed with her today the possibility that her renal function may be permanently impaired, may require intermittent HD. She understands this. Continue current support off of antibiotics. Albumin stopped. Start physical therapy. Independent critical care time is 33 minutes.   Baltazar Apo, MD, PhD 08/07/2016, 12:14 PM [facility] [phone] or if no answer [phone]

## 2016-08-07 NOTE — Progress Notes (Deleted)
H and P:   61 yo female presented with acute renal failure on 3/29.  Recent hospitalization at [place] on 3/27 with hematochezia and BUN/Cr of 54/9- believed to be from addition of zaroxolyn added to her outpatient diuretic regimen. Alternative is ABLA/ hemorrhagic shock  PMH: liver cirrhosis secondary to NASH, history of edema, HTN, COPD, CKD stage 3-4  Plan: Monitor CRRT for changes; Still severely fluid overloaded  Monitor BP- Off pressors; still on midodrine  CRRT for 4-5 more days  Aranesp starting- F/U iron studies    AC: Heparin for DVT ppx  ID: Treated with cipro and flagyl for 12/14 days pta for colitis. We gave her 3 more days inpatient 15/14.   CV: Phenylephrine off. MAP 59-69. BP soft. HR- 50s-60s   Endocrine: CBGs 100-153  GI: advanced non-alcoholic cirrhosis, NASH-following at [facility] for possible transplant. AST/ALT-29/14. Tbili- 1.3 Ammonia- 74 Rifaxamin and lactulose    Neuro: Venlafaxine/tramadol  Renal: SCr trend - Baseline:Was 1.8 in 01/2016.  3/31 CRRT>> CRRT off for an hour yesterday Pulling off  PTH K- 4.1 Mg- 2.4 Phos-1.9 last PM (sodium phosphate given last night)  CorrCa-8.2 UOP- anuric  Midodrine 10 mg TID for BP  No breaks in CRRT this AM  Hgb- 7.2 PLt up to 64 this AM

## 2016-08-08 LAB — MAGNESIUM: Magnesium: 2.5 mg/dL — ABNORMAL HIGH (ref 1.7–2.4)

## 2016-08-08 LAB — RENAL FUNCTION PANEL
ANION GAP: 7 (ref 5–15)
ANION GAP: 6 (ref 5–15)
Albumin: 3.5 g/dL (ref 3.5–5.0)
Albumin: 3.3 g/dL — ABNORMAL LOW (ref 3.5–5.0)
BUN: 12 mg/dL (ref 6–20)
BUN: 10 mg/dL (ref 6–20)
CALCIUM: 8.4 mg/dL — AB (ref 8.9–10.3)
CHLORIDE: 103 mmol/L (ref 101–111)
CHLORIDE: 105 mmol/L (ref 101–111)
CO2: 27 mmol/L (ref 22–32)
CO2: 26 mmol/L (ref 22–32)
Calcium: 8.4 mg/dL — ABNORMAL LOW (ref 8.9–10.3)
Creatinine, Ser: 2.7 mg/dL — ABNORMAL HIGH (ref 0.44–1.00)
Creatinine, Ser: 2.45 mg/dL — ABNORMAL HIGH (ref 0.44–1.00)
GFR calc Af Amer: 21 mL/min — ABNORMAL LOW (ref >60)
GFR calc Af Amer: 24 mL/min — ABNORMAL LOW (ref >60)
GFR calc non Af Amer: 18 mL/min — ABNORMAL LOW (ref >60)
GFR, EST NON AFRICAN AMERICAN: 20 mL/min — AB (ref >60)
Glucose, Bld: 76 mg/dL (ref 65–99)
Glucose, Bld: 100 mg/dL — ABNORMAL HIGH (ref 65–99)
PHOSPHORUS: 2.5 mg/dL (ref 2.5–4.6)
POTASSIUM: 4 mmol/L (ref 3.5–5.1)
POTASSIUM: 4 mmol/L (ref 3.5–5.1)
Phosphorus: 2.5 mg/dL (ref 2.5–4.6)
SODIUM: 137 mmol/L (ref 135–145)
Sodium: 137 mmol/L (ref 135–145)

## 2016-08-08 LAB — BPAM RBC
BLOOD PRODUCT EXPIRATION DATE: 201804262359
Blood Product Expiration Date: 201804262359
ISSUE DATE / TIME: 201803311501
UNIT TYPE AND RH: 8400
UNIT TYPE AND RH: 8400

## 2016-08-08 LAB — CBC
HCT: 22.1 % — ABNORMAL LOW (ref 36.0–46.0)
HEMOGLOBIN: 7.3 g/dL — AB (ref 12.0–15.0)
MCH: 29.8 pg (ref 26.0–34.0)
MCHC: 33 g/dL (ref 30.0–36.0)
MCV: 90.2 fL (ref 78.0–100.0)
Platelets: 59 10*3/uL — ABNORMAL LOW (ref 150–400)
RBC: 2.45 MIL/uL — ABNORMAL LOW (ref 3.87–5.11)
RDW: 18.7 % — ABNORMAL HIGH (ref 11.5–15.5)
WBC: 4.3 10*3/uL (ref 4.0–10.5)

## 2016-08-08 LAB — TYPE AND SCREEN
ABO/RH(D): AB POS
Antibody Screen: NEGATIVE
Unit division: 0
Unit division: 0

## 2016-08-08 NOTE — Progress Notes (Signed)
PULMONARY  / CRITICAL CARE MEDICINE  Name: Loretta [name] MRN: [id number] DOB: 01-26-1956    LOS: 56  REFERRING MD :  Nephrology  CHIEF COMPLAINT:  Renal failure  BRIEF PATIENT DESCRIPTION: Loretta [name] is a 61 year old woman with h/o gastric bypass, obesity, NASH cirrhosis complicated by acute kidney disease obesity transferred to the ICU for HD initiation.   LINES / TUBES: Right IJ HD catheter 3/31  SIGNIFICANT EVENTS:  3/29 >> admitted 4/1 >> transfer to ICU to start CRRT  DIET:  Renal  DVT Px:  Heparin subQ  GI Px:  None  HISTORY OF PRESENT ILLNESS:  Loretta [name] is a 61 year old woman with h/o gastric bypass, obesity, NASH cirrhosis complicated by acute kidney disease obesity transferred to the ICU for HD initiation. Her renal function did not improve with IV fluids, albumin, and midodrine on the floor.  INTERVAL HISTORY:  This morning, she was sitting in a chair with her husband and niece at bedside. She felt better and was pleased to hear she lost 18 lbs with CRRT. She acknowledges it will be a long process though is optimistic now that her arms appears less swollen.  VITAL SIGNS: Temp:  [97.4 F (36.3 C)-97.6 F (36.4 C)] 97.4 F (36.3 C) (04/04 1500) Pulse Rate:  [54-78] 71 (04/04 1400) Resp:  [11-26] 22 (04/04 1400) BP: (95-134)/(37-86) 124/62 (04/04 1400) SpO2:  [94 %-100 %] 98 % (04/04 1400) Weight:  [304 lb 10.8 oz (138.2 kg)] 304 lb 10.8 oz (138.2 kg) (04/04 0400) HEMODYNAMICS: CVP:  [13 mmHg-58 mmHg] 17 mmHg   INTAKE / OUTPUT: Intake/Output      04/03 0701 - 04/04 0700 04/04 0701 - 04/05 0700   P.O. 950 360   Other 0 0   IV Piggyback     Total Intake(mL/kg) 950 (6.9) 360 (2.6)   Urine (mL/kg/hr) 20 (0) 0 (0)   Other 3822 (1.2) 1519 (1.1)   Stool 300 (0.1) 150 (0.1)   Total Output 4142 1669   Net -3192 -1309        Stool Occurrence 2 x 1 x     PHYSICAL EXAMINATION: Physical Exam  Constitutional: She is oriented to person, place, and time. No  distress.  HENT:  Head: Normocephalic and atraumatic.  Eyes: Conjunctivae are normal. No scleral icterus.  Cardiovascular: Normal rate and regular rhythm.   Pulmonary/Chest: Effort normal. No respiratory distress.  Abdominal: Soft. She exhibits no distension.  Musculoskeletal: She exhibits edema (2-3+ pitting of both lower extremities).  Neurological: She is alert and oriented to person, place, and time.  Skin: She is not diaphoretic.     LABS: Cbc  Recent Labs Lab 08/06/16 0326 08/07/16 0410 08/08/16 0415  WBC 4.9 3.4* 4.3  HGB 7.4* 7.1* 7.3*  HCT 21.7* 20.9* 22.1*  PLT 62* 58* 59*    Chemistry   Recent Labs Lab 08/06/16 0326  08/07/16 0410 08/07/16 1616 08/08/16 0415  NA 137  < > 135 136 137  K 3.8  < > 3.8 3.9 4.0  CL 107  < > 106 104 103  CO2 22  < > '26 26 27  '$ BUN 32*  < > '19 16 12  '$ CREATININE 5.34*  < > 3.60* 3.32* 2.70*  CALCIUM 7.8*  < > 8.2* 8.2* 8.4*  MG 2.2  --  2.4  --  2.5*  PHOS 3.9  < > 2.9 2.7 2.5  GLUCOSE 77  < > 78 87 76  < > = values in  this interval not displayed.  Liver fxn  Recent Labs Lab 08/02/16 1900 08/03/16 0548  08/07/16 0410 08/07/16 1616 08/08/16 0415  AST 31 29  --   --   --   --   ALT 12* 14  --   --   --   --   ALKPHOS 83 80  --   --   --   --   BILITOT 1.2 1.3*  --   --   --   --   PROT 5.4* 5.2*  --   --   --   --   ALBUMIN 2.4* 2.6*  < > 3.6 3.5 3.5  < > = values in this interval not displayed.   DIAGNOSES: Active Problems:   Liver cirrhosis secondary to NASH (HCC)   Acute on chronic renal failure (HCC)   ARF (acute renal failure) (HCC)   Colitis   Hypertension   COPD (chronic obstructive pulmonary disease) (HCC)   Nephrolithiasis   Depression   Thrombocytopenia (HCC)   Morbid obesity (HCC)   CKD (chronic kidney disease) stage 4, GFR 15-29 ml/min (HCC)   Hypotension   ASSESSMENT / PLAN:  PULMONARY  ASSESSMENT:  OSA COPD  PLAN:   -Use BiPAP as needed   CARDIOVASCULAR  ASSESSMENT:   Hypertension: BP 92/44-136/65 over the last 24 hours with MAP trending 60s-70s.  PLAN:  -Continue midodrine 10 mg three times daily with meals and phenylephrine if needed for MAP > 65   RENAL  ASSESSMENT:   Acute on chronic kidney disease Stage 3: Acute tubular necrosis vs hepatorenal syndrome in the setting of acute blood loss anemia from hematochezia, hypovolemia, intermittent hypotension. Net -3.2L yesterday, -10.3L since admission.  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  Chronic normocytic anemia: Baseline Hb 7-8. s/p pRBC x 1.  PLAN:  -Follow CBC   NEUROLOGIC  ASSESSMENT:   Depression and anxiety Headache  PLAN:   -Continue venlafaxine 75 mg daily -Continue tramadol 50 mg every 12 hours as needed with acetaminophen 650 mg as an adjunct  Charlott Rakes, PGY3 Internal Medicine Pager: [phone] 08/08/2016, 4:40 PM   Attending Note:  I have examined patient, reviewed labs, studies and notes. I have discussed the case with Dr Posey Pronto, and I agree with the data and plans as amended above. 61 yo woman, hx NASH cirrhosis, OSA / OHS, CRF. compliccated hospitalization due to colitis and resultant septic and hemorrhagic shock. Developed ARF, hopefully reversible - currently unclear. On eval today she remains on CVVHD. She is now net negative for the hospitalization. She is comfortable, interacts normally. Lungs decreased at bases. Globally weak but non-focal neuro exam. Midodrine was started today. Nephrology planning to continue CVVHD and volume removal. We will continue supportive care.    Baltazar Apo, MD, PhD 08/08/2016, 5:25 PM [facility] [phone] or if no answer [phone]

## 2016-08-08 NOTE — Progress Notes (Signed)
Subjective: Interval History: has complaints still swollen.  Objective: Vital signs in last 24 hours: Temp:  [97.3 F (36.3 C)-97.6 F (36.4 C)] 97.6 F (36.4 C) (04/04 0343) Pulse Rate:  [54-76] 61 (04/04 0700) Resp:  [11-29] 14 (04/04 0700) BP: (92-136)/(37-98) 107/40 (04/04 0700) SpO2:  [93 %-100 %] 100 % (04/04 0700) Weight:  [138.2 kg (304 lb 10.8 oz)] 138.2 kg (304 lb 10.8 oz) (04/04 0400) Weight change: -6.497 kg (-14 lb 5.2 oz)  Intake/Output from previous day: 04/03 0701 - 04/04 0700 In: 950 [P.O.:950] Out: 4142 [Urine:20; Stool:300] Intake/Output this shift: No intake/output data recorded.  General appearance: alert, cooperative, no distress, morbidly obese and pale Neck: IJ cath Resp: diminished breath sounds bilaterally and rales bibasilar Cardio: S1, S2 normal and systolic murmur: holosystolic 2/6, blowing at apex GI: obese, pos bs, soft, abdm wall edema.   Extremitie3-4+edema 3-4+  Lab Results:  Recent Labs  08/07/16 0410 08/08/16 0415  WBC 3.4* 4.3  HGB 7.1* 7.3*  HCT 20.9* 22.1*  PLT 58* 59*   BMET:  Recent Labs  08/07/16 1616 08/08/16 0415  NA 136 137  K 3.9 4.0  CL 104 103  CO2 26 27  GLUCOSE 87 76  BUN 16 12  CREATININE 3.32* 2.70*  CALCIUM 8.2* 8.4*   No results for input(s): PTH in the last 72 hours. Iron Studies: No results for input(s): IRON, TIBC, TRANSFERRIN, FERRITIN in the last 72 hours.  Studies/Results: No results found.  I have reviewed the patient's current medications.  Assessment/Plan: 1 AKI oliguric will remove foley.   K acid/base/solute ok. Severe vol xs bp would prohibit IHD 2 Anemia stable 3 GIB 4 Cirrhosis 5 Massive obesity 6 DM controlled 7 low ptltl liver dz P CRRT, lower vol, consider esa,     LOS: 6 days   Maciah Schweigert L 08/08/2016,8:02 AM

## 2016-08-08 NOTE — Evaluation (Signed)
Physical Therapy Evaluation Patient Details Name: Loretta [name] MRN: [id number] DOB: Dec 15, 1955 Today's Date: 08/08/2016   History of Present Illness  Pt adm with acute renal failure and CVVHD initiated on 4/1. PMH - NASH cirrhosis, morbid obesity, gastric bypass  Clinical Impression  Pt admitted with above diagnosis and presents to PT with functional limitations due to deficits listed below (See PT problem list). Pt needs skilled PT to maximize independence and safety to allow discharge to home with husband. Pt very motivated to mobilize and regain independence.      Follow Up Recommendations Home health PT (May not need)    Equipment Recommendations  None recommended by PT    Recommendations for Other Services       Precautions / Restrictions Precautions Precautions: Fall;Other (comment) Precaution Comments: CVVHD lines Restrictions Weight Bearing Restrictions: No      Mobility  Bed Mobility Overal bed mobility: Needs Assistance Bed Mobility: Supine to Sit;Rolling Rolling: Min assist   Supine to sit: Min assist;+2 for safety/equipment     General bed mobility comments: Assist to elevate trunk into sitting  Transfers Overall transfer level: Needs assistance Equipment used: Rolling walker (2 wheeled);2 person hand held assist Transfers: Sit to/from [facility] Sit to Stand: +2 physical assistance;Min assist;+2 safety/equipment Stand pivot transfers: +2 physical assistance;Min assist;+2 safety/equipment       General transfer comment: Assist for balance and support. Bilateral hand held for 180 degree pivot to chair. From chair stood with walker with min A. Pt coming to stand with hands on walker due to this position decr pressure on pt's abdomen.  Ambulation/Gait             General Gait Details: Unable to amb due to CVVHD. Stood with walker and marched in place x 45 seconds with min A  Stairs            Wheelchair Mobility     Modified Rankin (Stroke Patients Only)       Balance Overall balance assessment: Needs assistance Sitting-balance support: No upper extremity supported;Feet supported Sitting balance-Leahy Scale: Good     Standing balance support: Single extremity supported Standing balance-Leahy Scale: Poor Standing balance comment: UE support                             Pertinent Vitals/Pain Pain Assessment: No/denies pain    Home Living Family/patient expects to be discharged to:: Private residence Living Arrangements: Spouse/significant other Available Help at Discharge: Family;Available 24 hours/day Type of Home: House Home Access: Stairs to enter Entrance Stairs-Rails: Psychiatric nurse of Steps: 5 Home Layout: One level Home Equipment: Walker - 4 wheels;Shower seat;Grab bars - tub/shower;Hand held shower head;Cane - single point      Prior Function Level of Independence: Needs assistance   Gait / Transfers Assistance Needed: Amb in house using furniture. Uses cane when our           Hand Dominance   Dominant Hand: Right    Extremity/Trunk Assessment   Upper Extremity Assessment Upper Extremity Assessment: Generalized weakness    Lower Extremity Assessment Lower Extremity Assessment: Generalized weakness       Communication   Communication: No difficulties  Cognition Arousal/Alertness: Awake/alert Behavior During Therapy: WFL for tasks assessed/performed Overall Cognitive Status: Within Functional Limits for tasks assessed  General Comments      Exercises     Assessment/Plan    PT Assessment Patient needs continued PT services  PT Problem List Decreased strength;Decreased activity tolerance;Decreased balance;Decreased mobility;Obesity;Cardiopulmonary status limiting activity       PT Treatment Interventions DME instruction;Gait training;Functional mobility  training;Therapeutic activities;Therapeutic exercise;Balance training;Patient/family education    PT Goals (Current goals can be found in the Care Plan section)  Acute Rehab PT Goals Patient Stated Goal: return home PT Goal Formulation: With patient Time For Goal Achievement: 08/15/16 Potential to Achieve Goals: Good    Frequency Min 3X/week   Barriers to discharge        Co-evaluation               End of Session   Activity Tolerance: Patient tolerated treatment well Patient left: in chair;with call bell/phone within reach;with family/visitor present;with nursing/sitter in room Nurse Communication: Mobility status (nursing assisted ) PT Visit Diagnosis: Muscle weakness (generalized) (M62.81);Difficulty in walking, not elsewhere classified (R26.2)    Time: 3567-0141 PT Time Calculation (min) (ACUTE ONLY): 17 min   Charges:   PT Evaluation $PT Eval Moderate Complexity: 1 Procedure     PT G CodesMarland Kitchen        [facility] [place] 08/08/2016, 11:48 AM

## 2016-08-09 DIAGNOSIS — D696 Thrombocytopenia, unspecified: Secondary | ICD-10-CM

## 2016-08-09 LAB — CBC
HCT: 21.8 % — ABNORMAL LOW (ref 36.0–46.0)
HEMOGLOBIN: 7.2 g/dL — AB (ref 12.0–15.0)
MCH: 30.4 pg (ref 26.0–34.0)
MCHC: 33 g/dL (ref 30.0–36.0)
MCV: 92 fL (ref 78.0–100.0)
Platelets: 64 10*3/uL — ABNORMAL LOW (ref 150–400)
RBC: 2.37 MIL/uL — ABNORMAL LOW (ref 3.87–5.11)
RDW: 19.2 % — ABNORMAL HIGH (ref 11.5–15.5)
WBC: 4.1 10*3/uL (ref 4.0–10.5)

## 2016-08-09 LAB — RENAL FUNCTION PANEL
ALBUMIN: 3.2 g/dL — AB (ref 3.5–5.0)
ANION GAP: 5 (ref 5–15)
BUN: 7 mg/dL (ref 6–20)
CALCIUM: 8 mg/dL — AB (ref 8.9–10.3)
CO2: 27 mmol/L (ref 22–32)
Chloride: 105 mmol/L (ref 101–111)
Creatinine, Ser: 2.07 mg/dL — ABNORMAL HIGH (ref 0.44–1.00)
GFR calc non Af Amer: 25 mL/min — ABNORMAL LOW (ref >60)
GFR, EST AFRICAN AMERICAN: 29 mL/min — AB (ref >60)
GLUCOSE: 72 mg/dL (ref 65–99)
PHOSPHORUS: 1.9 mg/dL — AB (ref 2.5–4.6)
POTASSIUM: 4.1 mmol/L (ref 3.5–5.1)
SODIUM: 137 mmol/L (ref 135–145)

## 2016-08-09 LAB — IRON AND TIBC
IRON: 39 ug/dL (ref 28–170)
SATURATION RATIOS: 15 % (ref 10.4–31.8)
TIBC: 255 ug/dL (ref 250–450)
UIBC: 216 ug/dL

## 2016-08-09 LAB — MAGNESIUM: MAGNESIUM: 2.4 mg/dL (ref 1.7–2.4)

## 2016-08-09 LAB — VITAMIN B12: VITAMIN B 12: 2185 pg/mL — AB (ref 180–914)

## 2016-08-09 MED ORDER — SODIUM PHOSPHATES 45 MMOLE/15ML IV SOLN
30.0000 mmol | Freq: Once | INTRAVENOUS | Status: AC
  Administered 2016-08-09: 30 mmol via INTRAVENOUS
  Filled 2016-08-09: qty 10

## 2016-08-09 MED ORDER — TRAMADOL HCL 50 MG PO TABS
50.0000 mg | ORAL_TABLET | Freq: Four times a day (QID) | ORAL | Status: DC | PRN
  Administered 2016-08-09 – 2016-08-27 (×6): 50 mg via ORAL
  Filled 2016-08-09 (×5): qty 1

## 2016-08-09 MED ORDER — CYCLOBENZAPRINE HCL 10 MG PO TABS
5.0000 mg | ORAL_TABLET | Freq: Once | ORAL | Status: AC
  Administered 2016-08-09: 5 mg via ORAL
  Filled 2016-08-09: qty 1

## 2016-08-09 NOTE — Progress Notes (Signed)
Subjective Interval History: has no complaint. Knows fluid xs , no urine.  Objective: Vital signs in last 24 hours: Temp:  [94.4 F (34.7 C)-97.9 F (36.6 C)] 97.9 F (36.6 C) (04/05 0344) Pulse Rate:  [63-82] 75 (04/05 0700) Resp:  [12-26] 12 (04/05 0700) BP: (82-161)/(37-100) 102/41 (04/05 0700) SpO2:  [95 %-100 %] 100 % (04/05 0700) Weight:  [136.7 kg (301 lb 6.4 oz)] 136.7 kg (301 lb 6.4 oz) (04/05 0530) Weight change: -1.486 kg (-3 lb 4.4 oz)  Intake/Output from previous day: 04/04 0701 - 04/05 0700 In: 970 [P.O.:970] Out: 3864 [Stool:400] Intake/Output this shift: No intake/output data recorded.  General appearance: alert, cooperative, no distress, morbidly obese and pale Neck: RIJ temp cath Resp: diminished breath sounds bilaterally and rales bibasilar Cardio: S1, S2 normal and systolic murmur: holosystolic 2/6, blowing at apex GI: obese, abdm wall edema Extremities: edema 4+  Lab Results:  Recent Labs  08/08/16 0415 08/09/16 0330  WBC 4.3 4.1  HGB 7.3* 7.2*  HCT 22.1* 21.8*  PLT 59* 64*   BMET:  Recent Labs  08/08/16 1600 08/09/16 0345  NA 137 137  K 4.0 4.1  CL 105 105  CO2 26 27  GLUCOSE 100* 72  BUN 10 7  CREATININE 2.45* 2.07*  CALCIUM 8.4* 8.0*   No results for input(s): PTH in the last 72 hours. Iron Studies: No results for input(s): IRON, TIBC, TRANSFERRIN, FERRITIN in the last 72 hours.  Studies/Results: No results found.  I have reviewed the patient's current medications.  Assessment/Plan: 1 AKI ATN vs HRS suspect ATN from hemorrhagic schock.  Will cont to lower vol, solute. Phos being replete 2 Anemia check Fe, start ESA 3 DM controlled 4 Cirrhosis 5 Massive obesity P CRRT for 4-5 more d. Lower vol, start esa, get Fe/TIBC    LOS: 7 days   Toris Laverdiere L 08/09/2016,7:51 AM

## 2016-08-09 NOTE — Progress Notes (Signed)
[facility] Progress Note Patient Name: Loretta [name] DOB: 06-23-1955 MRN: [id number]   Date of Service  08/09/2016  HPI/Events of Note  Patient c/o neck pain - related to HD catheter vs muscle spasm.   eICU Interventions  Will order: 1. Increase Tramadol 50 mg PO Q 6 hours PRN pain. 2. Flexeril 5 mg PO now.      Intervention Category Intermediate Interventions: Pain - evaluation and management  Edra Riccardi [place] 08/09/2016, 5:48 PM

## 2016-08-09 NOTE — Progress Notes (Signed)
PULMONARY  / CRITICAL CARE MEDICINE  Name: Loretta [name] MRN: [id number] DOB: 04/26/1956    LOS: 63  REFERRING MD :  Nephrology  CHIEF COMPLAINT:  Renal failure  BRIEF PATIENT DESCRIPTION: Ms. 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   LINES / TUBES: Right IJ HD catheter 3/31  SIGNIFICANT EVENTS:  3/29 >> admitted 4/1 >> transfer to ICU to start CRRT  DIET:  Renal  DVT Px:  Heparin subQ  GI Px:  None  HISTORY OF PRESENT ILLNESS:  Ms. 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 Her renal function did not improve with IV fluids, albumin, and midodrine on the floor.  INTERVAL HISTORY:   Comfortable, believes that she is less edematous in her hands Denies dyspnea.   VITAL SIGNS: Temp:  [94.4 F (34.7 C)-97.9 F (36.6 C)] 97.2 F (36.2 C) (04/05 0830) Pulse Rate:  [65-82] 79 (04/05 1000) Resp:  [12-22] 22 (04/05 1100) BP: (80-161)/(33-100) 102/77 (04/05 1100) SpO2:  [95 %-100 %] 100 % (04/05 1000) Weight:  [136.7 kg (301 lb 6.4 oz)] 136.7 kg (301 lb 6.4 oz) (04/05 0530) HEMODYNAMICS:     INTAKE / OUTPUT: Intake/Output      04/04 0701 - 04/05 0700 04/05 0701 - 04/06 0700   P.O. 970 300   Other 0    IV Piggyback  260   Total Intake(mL/kg) 970 (7.1) 560 (4.1)   Urine (mL/kg/hr) 0 (0)    Other 3464 (1.1) 636 (1)   Stool 400 (0.1) 0 (0)   Total Output 3864 636   Net -2894 -76        Stool Occurrence 3 x 3 x     PHYSICAL EXAMINATION: Physical Exam  Constitutional: She is oriented to person, place, and time. No distress.  HENT:  Head: Normocephalic and atraumatic.  Eyes: Conjunctivae are normal. No scleral icterus.  Cardiovascular: Normal rate and regular rhythm.   Pulmonary/Chest: Effort normal. No respiratory  distress.  Abdominal: Soft. She exhibits no distension.  Musculoskeletal: She exhibits edema (2-3+ pitting of both lower extremities).  Neurological: She is alert and oriented to person, place, and time.  Skin: She is not diaphoretic.     LABS: Cbc  Recent Labs Lab 08/07/16 0410 08/08/16 0415 08/09/16 0330  WBC 3.4* 4.3 4.1  HGB 7.1* 7.3* 7.2*  HCT 20.9* 22.1* 21.8*  PLT 58* 59* 64*    Chemistry   Recent Labs Lab 08/07/16 0410  08/08/16 0415 08/08/16 1600 08/09/16 0345  NA 135  < > 137 137 137  K 3.8  < > 4.0 4.0 4.1  CL 106  < > 103 105 105  CO2 26  < > '27 26 27  '$ BUN 19  < > '12 10 7  '$ CREATININE 3.60*  < > 2.70* 2.45* 2.07*  CALCIUM 8.2*  < > 8.4* 8.4* 8.0*  MG 2.4  --  2.5*  --  2.4  PHOS 2.9  < > 2.5 2.5 1.9*  GLUCOSE 78  < > 76 100* 72  < > = values in this interval not displayed.  Liver fxn  Recent Labs Lab 08/02/16 1900 08/03/16 0548  08/08/16 0415 08/08/16 1600 08/09/16 0345  AST 31 29  --   --   --   --  ALT 12* 14  --   --   --   --   ALKPHOS 83 80  --   --   --   --   BILITOT 1.2 1.3*  --   --   --   --   PROT 5.4* 5.2*  --   --   --   --   ALBUMIN 2.4* 2.6*  < > 3.5 3.3* 3.2*  < > = values in this interval not displayed.   DIAGNOSES: Active Problems:   Liver cirrhosis secondary to NASH (HCC)   Acute on chronic renal failure (HCC)   ARF (acute renal failure) (HCC)   Colitis   Hypertension   COPD (chronic obstructive pulmonary disease) (HCC)   Nephrolithiasis   Depression   Thrombocytopenia (HCC)   Morbid obesity (HCC)   CKD (chronic kidney disease) stage 4, GFR 15-29 ml/min (HCC)   Hypotension   ASSESSMENT / PLAN:  PULMONARY  ASSESSMENT:  OSA COPD  PLAN:   Has not required BiPAP Not currently on bronchodilators   CARDIOVASCULAR  ASSESSMENT:  History of Hypertension: BP 92/44-136/65 over the last 24 hours with MAP trending 60s-70s. Hypotension  PLAN:  Continue Midrin as ordered Phenylephrine when necessary to  allow continuation of CVVHD and volume removal   RENAL  ASSESSMENT:   Acute on chronic kidney disease Stage 3: Acute tubular necrosis vs hepatorenal syndrome in the setting of acute blood loss anemia from hematochezia, hypovolemia, intermittent hypotension.   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   PLAN:  Follow CBC No evidence of active bleeding, follow   NEUROLOGIC  ASSESSMENT:   Depression and anxiety Headache  PLAN:   Continue venlafaxine Continue tramadol and Tylenol as needed    Baltazar Apo, MD, PhD 08/09/2016, 11:34 AM [facility] [phone] or if no answer [phone]

## 2016-08-09 NOTE — Progress Notes (Signed)
[facility] Progress Note Patient Name: Loretta [name] DOB: 1956-03-03 MRN: [id number]   Date of Service  08/09/2016  HPI/Events of Note  hypophos  eICU Interventions  Phos replaced     Intervention Category Intermediate Interventions: Electrolyte abnormality - evaluation and management  DETERDING,ELIZABETH 08/09/2016, 5:00 AM

## 2016-08-09 NOTE — Plan of Care (Signed)
Problem: Safety: Goal: Ability to remain free from injury will improve Outcome: Progressing Bedrest/BRP. Moderate fall risk  Problem: Pain Managment: Goal: General experience of comfort will improve Outcome: Progressing Minimal c/o pain  Problem: Physical Regulation: Goal: Ability to maintain clinical measurements within normal limits will improve Outcome: Progressing Bp soft  Problem: Skin Integrity: Goal: Risk for impaired skin integrity will decrease Outcome: Progressing MASD on bottom. Applying cream

## 2016-08-10 LAB — CBC
HEMATOCRIT: 21.4 % — AB (ref 36.0–46.0)
Hemoglobin: 7 g/dL — ABNORMAL LOW (ref 12.0–15.0)
MCH: 30 pg (ref 26.0–34.0)
MCHC: 32.7 g/dL (ref 30.0–36.0)
MCV: 91.8 fL (ref 78.0–100.0)
Platelets: 58 10*3/uL — ABNORMAL LOW (ref 150–400)
RBC: 2.33 MIL/uL — ABNORMAL LOW (ref 3.87–5.11)
RDW: 19.1 % — ABNORMAL HIGH (ref 11.5–15.5)
WBC: 3.9 10*3/uL — ABNORMAL LOW (ref 4.0–10.5)

## 2016-08-10 LAB — RENAL FUNCTION PANEL
ALBUMIN: 3.1 g/dL — AB (ref 3.5–5.0)
ANION GAP: 6 (ref 5–15)
Albumin: 2.9 g/dL — ABNORMAL LOW (ref 3.5–5.0)
Anion gap: 7 (ref 5–15)
BUN: 6 mg/dL (ref 6–20)
BUN: 5 mg/dL — ABNORMAL LOW (ref 6–20)
CHLORIDE: 104 mmol/L (ref 101–111)
CO2: 26 mmol/L (ref 22–32)
CO2: 25 mmol/L (ref 22–32)
Calcium: 8 mg/dL — ABNORMAL LOW (ref 8.9–10.3)
Calcium: 8.2 mg/dL — ABNORMAL LOW (ref 8.9–10.3)
Chloride: 103 mmol/L (ref 101–111)
Creatinine, Ser: 1.87 mg/dL — ABNORMAL HIGH (ref 0.44–1.00)
Creatinine, Ser: 1.98 mg/dL — ABNORMAL HIGH (ref 0.44–1.00)
GFR calc Af Amer: 33 mL/min — ABNORMAL LOW (ref >60)
GFR calc Af Amer: 31 mL/min — ABNORMAL LOW (ref >60)
GFR calc non Af Amer: 26 mL/min — ABNORMAL LOW (ref >60)
GFR, EST NON AFRICAN AMERICAN: 28 mL/min — AB (ref >60)
Glucose, Bld: 72 mg/dL (ref 65–99)
Glucose, Bld: 105 mg/dL — ABNORMAL HIGH (ref 65–99)
PHOSPHORUS: 2.8 mg/dL (ref 2.5–4.6)
POTASSIUM: 4 mmol/L (ref 3.5–5.1)
POTASSIUM: 4.3 mmol/L (ref 3.5–5.1)
Phosphorus: 2.2 mg/dL — ABNORMAL LOW (ref 2.5–4.6)
Sodium: 137 mmol/L (ref 135–145)
Sodium: 134 mmol/L — ABNORMAL LOW (ref 135–145)

## 2016-08-10 LAB — FOLATE RBC
Folate, Hemolysate: 559.6 ng/mL
Folate, RBC: 2591 ng/mL (ref >498)
HEMATOCRIT: 21.6 % — AB (ref 34.0–46.6)

## 2016-08-10 LAB — MAGNESIUM: MAGNESIUM: 2.4 mg/dL (ref 1.7–2.4)

## 2016-08-10 MED ORDER — ZOLPIDEM TARTRATE 5 MG PO TABS
5.0000 mg | ORAL_TABLET | Freq: Every evening | ORAL | Status: DC | PRN
  Administered 2016-08-10: 5 mg via ORAL
  Filled 2016-08-10: qty 1

## 2016-08-10 MED ORDER — SODIUM CHLORIDE 0.9 % IV SOLN
510.0000 mg | Freq: Once | INTRAVENOUS | Status: AC
  Administered 2016-08-10: 510 mg via INTRAVENOUS
  Filled 2016-08-10: qty 17

## 2016-08-10 MED ORDER — SODIUM PHOSPHATES 45 MMOLE/15ML IV SOLN
30.0000 mmol | Freq: Once | INTRAVENOUS | Status: AC
  Administered 2016-08-10: 30 mmol via INTRAVENOUS
  Filled 2016-08-10: qty 10

## 2016-08-10 MED ORDER — CYCLOBENZAPRINE HCL 5 MG PO TABS
5.0000 mg | ORAL_TABLET | Freq: Every evening | ORAL | Status: DC | PRN
  Administered 2016-08-12 (×2): 5 mg via ORAL
  Filled 2016-08-10 (×3): qty 1

## 2016-08-10 MED ORDER — DARBEPOETIN ALFA 200 MCG/0.4ML IJ SOSY
200.0000 ug | PREFILLED_SYRINGE | INTRAMUSCULAR | Status: DC
  Administered 2016-08-10 – 2016-08-18 (×2): 200 ug via SUBCUTANEOUS
  Filled 2016-08-10 (×3): qty 0.4

## 2016-08-10 NOTE — Care Management Note (Signed)
Case Management Note Original Note created by Brandt Loosen  Patient Details  Name: Maly Lemarr MRN: [id number] Date of Birth: 11-15-1955  Subjective/Objective:  61 y.o. F evaluated by previous CM earlier in the week and found to have no HH needs and has Walker and cane at home. No needs at this time. Issues today requiring transfer to ICU for HD cath and CRRT. Will reevaluate when closer to discharge                  Action/Plan:CM will continue to follow closely for disposition/discharge needs.    Expected Discharge Date:                  Expected Discharge Plan:  Home/Self Care  In-House Referral:  NA  Discharge planning Services  CM Consult  Post Acute Care Choice:  NA Choice offered to:  NA  DME Arranged:   (Has RW and Cane at home) DME Agency:     HH Arranged:    [place] Agency:     Status of Service:  In process, will continue to follow  If discussed at Long Length of Stay Meetings, dates discussed:    Additional Comments: Pt alert and oriented. State she has used [facility] home health in the past and "if I need it when I go" would like to use them again.  Pt is from home with husband and grandchildren.  Pt uses walker in the home and a ramp.  CM will continue to follow for discharge needs.  Pt is on CRRT Maryclare Labrador, RN 08/10/2016, 3:43 PM

## 2016-08-10 NOTE — Progress Notes (Signed)
[facility] Progress Note Patient Name: Loretta [name] DOB: 04/16/1956 MRN: [id number]   Date of Service  08/10/2016  HPI/Events of Note  Hypophos  eICU Interventions  Phos replaced     Intervention Category Intermediate Interventions: Electrolyte abnormality - evaluation and management  DETERDING,ELIZABETH 08/10/2016, 4:44 AM

## 2016-08-10 NOTE — Progress Notes (Signed)
PULMONARY  / CRITICAL CARE MEDICINE  Name: Loretta [name] MRN: [id number] DOB: 26-Jan-1956    LOS: 19  REFERRING MD :  Nephrology  CHIEF COMPLAINT:  Renal failure  BRIEF PATIENT DESCRIPTION: Loretta [name] is a 61 year old woman with h/o gastric bypass, obesity, NASH cirrhosis complicated by acute kidney disease obesity transferred to the ICU for HD initiation.   LINES / TUBES: Right IJ HD catheter 3/31>>  SIGNIFICANT EVENTS:  3/29 >> admitted 4/1 >> transfer to ICU to start CRRT  DIET:  Renal  DVT Px:  Heparin subQ  GI Px:  None  HISTORY OF PRESENT ILLNESS:  Loretta [name] is a 61 year old woman with h/o gastric bypass, obesity, NASH cirrhosis complicated by acute kidney disease obesity transferred to the ICU for HD initiation. Her renal function did not improve with IV fluids, albumin, and midodrine on the floor.  INTERVAL HISTORY:   Feels less edematous. Reports that she did have a small amount of urine output last night Difficulty sleeping, having neck pain  VITAL SIGNS: Temp:  [97.4 F (36.3 C)-98.2 F (36.8 C)] 98 F (36.7 C) (04/06 0825) Pulse Rate:  [69-94] 83 (04/06 0800) Resp:  [13-26] 18 (04/06 0800) BP: (73-112)/(23-77) 104/49 (04/06 0800) SpO2:  [98 %-100 %] 100 % (04/06 0800) Weight:  [136.5 kg (301 lb)] 136.5 kg (301 lb) (04/06 0500) HEMODYNAMICS:     INTAKE / OUTPUT: Intake/Output      04/05 0701 - 04/06 0700 04/06 0701 - 04/07 0700   P.O. 830    I.V. (mL/kg) 11.2 (0.1) 11.3 (0.1)   Other     IV Piggyback 303 43   Total Intake(mL/kg) 1144.2 (8.4) 54.3 (0.4)   Urine (mL/kg/hr) 0 (0)    Other 4052 (1.2) 22 (0.1)   Stool 0 (0) 25 (0.1)   Total Output 4052 47   Net -2907.8 +7.3        Urine Occurrence 1 x    Stool Occurrence 7 x 1 x     PHYSICAL EXAMINATION: Physical Exam  Constitutional: She is oriented to person, place, and time. No distress.  HENT:  Head: Normocephalic and atraumatic.  Eyes: Conjunctivae are normal. No scleral icterus.   Neck: Neck supple.  Cardiovascular: Normal rate and regular rhythm.   Murmur heard. Pulmonary/Chest: Effort normal. No respiratory distress.  Abdominal: Soft. She exhibits no distension.  Musculoskeletal: Edema: 2-3+ pitting of both lower extremities.  Neurological: She is alert and oriented to person, place, and time.  Skin: Skin is warm and dry. She is not diaphoretic.     LABS: Cbc  Recent Labs Lab 08/08/16 0415 08/09/16 0330 08/10/16 0300  WBC 4.3 4.1 3.9*  HGB 7.3* 7.2* 7.0*  HCT 22.1* 21.8* 21.4*  PLT 59* 64* 58*    Chemistry   Recent Labs Lab 08/08/16 0415 08/08/16 1600 08/09/16 0345 08/10/16 0300  NA 137 137 137 137  K 4.0 4.0 4.1 4.0  CL 103 105 105 104  CO2 '27 26 27 26  '$ BUN '12 10 7 6  '$ CREATININE 2.70* 2.45* 2.07* 1.87*  CALCIUM 8.4* 8.4* 8.0* 8.0*  MG 2.5*  --  2.4 2.4  PHOS 2.5 2.5 1.9* 2.2*  GLUCOSE 76 100* 72 72    Liver fxn  Recent Labs Lab 08/08/16 1600 08/09/16 0345 08/10/16 0300  ALBUMIN 3.3* 3.2* 2.9*     DIAGNOSES: Active Problems:   Liver cirrhosis secondary to NASH ([place])   Acute on chronic renal failure (HCC)   ARF (acute renal  failure) (HCC)   Colitis   Hypertension   COPD (chronic obstructive pulmonary disease) (HCC)   Nephrolithiasis   Depression   Thrombocytopenia (HCC)   Morbid obesity (HCC)   CKD (chronic kidney disease) stage 4, GFR 15-29 ml/min (HCC)   Hypotension   ASSESSMENT / PLAN:  PULMONARY  ASSESSMENT:  OSA COPD  PLAN:    Requiring BiPAP Could benefit from nocturnal positive pressure although she is poorly compliant at baseline Not currently requiring bronchodilators   CARDIOVASCULAR  ASSESSMENT:  History of Hypertension:  Hypotension, due to hepatic disease and aggressive volume removal  PLAN:  Continue Midodrin as ordered Wean phenylephrine as able, currently requiring 15   RENAL ASSESSMENT:   Acute on chronic kidney disease Stage 3: Acute tubular necrosis vs hepatorenal  syndrome in the setting of acute blood loss anemia from hematochezia, hypovolemia, intermittent hypotension.   PLAN:   Planning for CVVHD at least through Sunday with aggressive volume removal Unclear whether there will be any renal recovery. She may need a tunneled catheter for intermittent hemodialysis. 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   PLAN:  We will try to minimize blood draws Follow CBC Continue to follow for evidence active bleeding   NEUROLOGIC  ASSESSMENT:   Depression and anxiety Headache, neck pain Insomnia  PLAN:   Continue venlafaxine Tramadol increased, added Flexeril Add Ambien   Independent CC time 31 minutes  Baltazar Apo, MD, PhD 08/10/2016, 11:01 AM [facility] [phone] or if no answer [phone]

## 2016-08-10 NOTE — Progress Notes (Signed)
qPhysical Therapy Treatment Patient Details Name: Loretta [name] MRN: [id number] DOB: 07-28-1955 Today's Date: 08/10/2016    History of Present Illness Pt adm with acute renal failure and CVVHD initiated on 4/1. PMH - NASH cirrhosis, morbid obesity, gastric bypass    PT Comments    Pt making good progress. Pt still on CVVHD but able to amb 30' by walking forward and backward 5' 3 times with nurse bringing dialysis machine forward and backward and managing those lines.    Follow Up Recommendations  Home health PT (May not need)     Equipment Recommendations  None recommended by PT    Recommendations for Other Services       Precautions / Restrictions Precautions Precautions: Fall;Other (comment) Precaution Comments: CVVHD lines Restrictions Weight Bearing Restrictions: No    Mobility  Bed Mobility                  Transfers Overall transfer level: Needs assistance Equipment used: Rolling walker (2 wheeled);2 person hand held assist Transfers: Sit to/from Stand Sit to Stand: Min assist;+2 safety/equipment         General transfer comment: Assist for balance and support. Pt coming to stand with hands on walker due to this position decr pressure on pt's abdomen.  Ambulation/Gait Ambulation/Gait assistance: Min assist;+2 safety/equipment (3rd person for lines) Ambulation Distance (Feet): 30 Feet (5' forward and backward x 3) Assistive device: Rolling walker (2 wheeled) Gait Pattern/deviations: Step-through pattern;Decreased step length - right;Decreased step length - left Gait velocity: decr Gait velocity interpretation: Below normal speed for age/gender General Gait Details: Assist for balance and support. Nurse present and holding CVVHD lines and bring dialysis machine forward as pt amb. Rehab tech present to assist with other lines   Stairs            Wheelchair Mobility    Modified Rankin (Stroke Patients Only)       Balance Overall balance  assessment: Needs assistance Sitting-balance support: No upper extremity supported;Feet supported Sitting balance-Leahy Scale: Good     Standing balance support: Single extremity supported Standing balance-Leahy Scale: Poor Standing balance comment: UE support                            Cognition Arousal/Alertness: Awake/alert Behavior During Therapy: WFL for tasks assessed/performed Overall Cognitive Status: Within Functional Limits for tasks assessed                                        Exercises      General Comments        Pertinent Vitals/Pain      Home Living                      Prior Function            PT Goals (current goals can now be found in the care plan section) Progress towards PT goals: Progressing toward goals    Frequency    Min 3X/week      PT Plan Current plan remains appropriate    Co-evaluation             End of Session   Activity Tolerance: Patient tolerated treatment well Patient left: in chair;with call bell/phone within reach;with nursing/sitter in room Nurse Communication: Mobility status (nursing assisted ) PT Visit Diagnosis: Muscle weakness (generalized) (  M62.81);Difficulty in walking, not elsewhere classified (R26.2)     Time: 1430-1450 PT Time Calculation (min) (ACUTE ONLY): 20 min  Charges:  $Gait Training: 8-22 mins                    G Codes:       [facility] PT [place] 08/10/2016, 4:18 PM

## 2016-08-10 NOTE — Progress Notes (Signed)
Subjective: Interval History: has complaints stiff neck.  Objective: Vital signs in last 24 hours: Temp:  [97.2 F (36.2 C)-98.2 F (36.8 C)] 98 F (36.7 C) (04/06 0353) Pulse Rate:  [69-94] 83 (04/06 0700) Resp:  [13-26] 17 (04/06 0700) BP: (73-112)/(23-77) 94/48 (04/06 0700) SpO2:  [98 %-100 %] 99 % (04/06 0700) Weight:  [136.5 kg (301 lb)] 136.5 kg (301 lb) (04/06 0500) Weight change: -0.181 kg (-6.4 oz)  Intake/Output from previous day: 04/05 0701 - 04/06 0700 In: 1144.2 [P.O.:830; I.V.:11.2; IV Piggyback:303] Out: 4052  Intake/Output this shift: No intake/output data recorded.  General appearance: alert, cooperative, morbidly obese and pale Neck: RIJ cath Resp: RIJ cath Cardio: regular rate and rhythm, S1, S2 normal and systolic murmur: holosystolic 2/6, blowing at apex GI: obese, abdm wall edema, pos bs, Extremities: edema 3-4+  resp decreased bs Lab Results:  Recent Labs  08/09/16 0330 08/10/16 0300  WBC 4.1 3.9*  HGB 7.2* 7.0*  HCT 21.8* 21.4*  PLT 64* 58*   BMET:  Recent Labs  08/09/16 0345 08/10/16 0300  NA 137 137  K 4.1 4.0  CL 105 104  CO2 27 26  GLUCOSE 72 72  BUN 7 6  CREATININE 2.07* 1.87*  CALCIUM 8.0* 8.0*   No results for input(s): PTH in the last 72 hours. Iron Studies:  Recent Labs  08/09/16 0903  IRON 39  TIBC 255    Studies/Results: No results found.  I have reviewed the patient's current medications.  Assessment/Plan: 1 AKI presumably ischemic ATN, but HRS is poss. Vol xs. Good solute ,acid/base, K.  Phos being given.  Would like 2-3 more d of CRRT. Then transition to IHD 2 Cirrhosis 3 Low ptlt secondary to #2 4 Anemia start esa/Fe 5 Massive obesity 6 DM controlled P CRRT, esa, Fe, lower vol    LOS: 8 days   Stacee Earp L 08/10/2016,7:48 AM

## 2016-08-11 LAB — CBC
HCT: 20.6 % — ABNORMAL LOW (ref 36.0–46.0)
HCT: 22 % — ABNORMAL LOW (ref 36.0–46.0)
HEMOGLOBIN: 7.3 g/dL — AB (ref 12.0–15.0)
Hemoglobin: 6.8 g/dL — CL (ref 12.0–15.0)
MCH: 30 pg (ref 26.0–34.0)
MCH: 29.7 pg (ref 26.0–34.0)
MCHC: 33 g/dL (ref 30.0–36.0)
MCHC: 33.2 g/dL (ref 30.0–36.0)
MCV: 90.7 fL (ref 78.0–100.0)
MCV: 89.4 fL (ref 78.0–100.0)
PLATELETS: 61 10*3/uL — AB (ref 150–400)
Platelets: 57 10*3/uL — ABNORMAL LOW (ref 150–400)
RBC: 2.27 MIL/uL — AB (ref 3.87–5.11)
RBC: 2.46 MIL/uL — AB (ref 3.87–5.11)
RDW: 18.9 % — ABNORMAL HIGH (ref 11.5–15.5)
RDW: 19 % — ABNORMAL HIGH (ref 11.5–15.5)
WBC: 4.1 10*3/uL (ref 4.0–10.5)
WBC: 4.2 10*3/uL (ref 4.0–10.5)

## 2016-08-11 LAB — RENAL FUNCTION PANEL
ALBUMIN: 2.8 g/dL — AB (ref 3.5–5.0)
ANION GAP: 9 (ref 5–15)
Albumin: 3 g/dL — ABNORMAL LOW (ref 3.5–5.0)
Anion gap: 7 (ref 5–15)
BUN: 5 mg/dL — ABNORMAL LOW (ref 6–20)
BUN: 5 mg/dL — ABNORMAL LOW (ref 6–20)
CALCIUM: 8.4 mg/dL — AB (ref 8.9–10.3)
CALCIUM: 8.8 mg/dL — AB (ref 8.9–10.3)
CO2: 25 mmol/L (ref 22–32)
CO2: 26 mmol/L (ref 22–32)
CREATININE: 1.77 mg/dL — AB (ref 0.44–1.00)
Chloride: 102 mmol/L (ref 101–111)
Chloride: 103 mmol/L (ref 101–111)
Creatinine, Ser: 1.92 mg/dL — ABNORMAL HIGH (ref 0.44–1.00)
GFR calc Af Amer: 35 mL/min — ABNORMAL LOW (ref >60)
GFR calc non Af Amer: 27 mL/min — ABNORMAL LOW (ref >60)
GFR calc non Af Amer: 30 mL/min — ABNORMAL LOW (ref >60)
GFR, EST AFRICAN AMERICAN: 32 mL/min — AB (ref >60)
GLUCOSE: 94 mg/dL (ref 65–99)
Glucose, Bld: 71 mg/dL (ref 65–99)
Phosphorus: 2.9 mg/dL (ref 2.5–4.6)
Phosphorus: 2.2 mg/dL — ABNORMAL LOW (ref 2.5–4.6)
Potassium: 4.2 mmol/L (ref 3.5–5.1)
Potassium: 4 mmol/L (ref 3.5–5.1)
SODIUM: 136 mmol/L (ref 135–145)
SODIUM: 136 mmol/L (ref 135–145)

## 2016-08-11 LAB — PARATHYROID HORMONE, INTACT (NO CA): PTH: 127 pg/mL — ABNORMAL HIGH (ref 15–65)

## 2016-08-11 LAB — PREPARE RBC (CROSSMATCH)

## 2016-08-11 LAB — MAGNESIUM: MAGNESIUM: 2.4 mg/dL (ref 1.7–2.4)

## 2016-08-11 MED ORDER — SODIUM CHLORIDE 0.9 % IV SOLN
510.0000 mg | INTRAVENOUS | Status: AC
  Administered 2016-08-11 – 2016-08-18 (×2): 510 mg via INTRAVENOUS
  Filled 2016-08-11 (×4): qty 17

## 2016-08-11 MED ORDER — DIPHENHYDRAMINE HCL 25 MG PO CAPS
25.0000 mg | ORAL_CAPSULE | Freq: Once | ORAL | Status: AC
  Administered 2016-08-11: 25 mg via ORAL
  Filled 2016-08-11: qty 1

## 2016-08-11 MED ORDER — SODIUM CHLORIDE 0.9 % IV SOLN: Freq: Once | INTRAVENOUS | Status: DC

## 2016-08-11 NOTE — Progress Notes (Signed)
[place] Progress Note Patient Name: Loretta [name] DOB: 11-Feb-1956 MRN: [id number]   Date of Service  08/11/2016  HPI/Events of Note  Hgb drift down to 6.8 from 7.0  eICU Interventions  Plan: Transfuse 1 unit pRBC Post-transfusion CBC     Intervention Category Intermediate Interventions: Other:  DETERDING,ELIZABETH 08/11/2016, 5:23 AM

## 2016-08-11 NOTE — Progress Notes (Signed)
Subjective: Interval History: has no complaint, feels much better.  Objective: Vital signs in last 24 hours: Temp:  [97.8 F (36.6 C)-99.1 F (37.3 C)] 99 F (37.2 C) (04/07 0736) Pulse Rate:  [78-100] 95 (04/07 0736) Resp:  [13-35] 17 (04/07 0736) BP: (80-124)/(42-91) 98/42 (04/07 0736) SpO2:  [95 %-100 %] 96 % (04/07 0736) Weight:  [129 kg (284 lb 6.3 oz)] 129 kg (284 lb 6.3 oz) (04/07 0500) Weight change: -7.533 kg (-16 lb 9.7 oz)  Intake/Output from previous day: 04/06 0701 - 04/07 0700 In: 979 [P.O.:760; I.V.:47; IV Piggyback:172] Out: 1901 [Stool:55] Intake/Output this shift: No intake/output data recorded.  General appearance: alert, cooperative, no distress, morbidly obese and pale Neck: RIJ cath Resp: rales bibasilar and rhonchi bibasilar Cardio: S1, S2 normal and systolic murmur: holosystolic 2/6, blowing at apex GI: obese, pos bs soft,  Extremitie3+ edema, presacraledema 3+ edema, presacral  Lab Results:  Recent Labs  08/10/16 0300 08/11/16 0430  WBC 3.9* 4.1  HGB 7.0* 6.8*  HCT 21.4* 20.6*  PLT 58* 61*   BMET:  Recent Labs  08/10/16 1920 08/11/16 0430  NA 134* 136  K 4.3 4.2  CL 103 102  CO2 25 25  GLUCOSE 105* 71  BUN 5* 5*  CREATININE 1.98* 1.92*  CALCIUM 8.2* 8.4*   No results for input(s): PTH in the last 72 hours. Iron Studies:  Recent Labs  08/09/16 0903  IRON 39  TIBC 255    Studies/Results: No results found.  I have reviewed the patient's current medications.  Assessment/Plan: 1 AKI good clearance with CRRT of solute/acid/base/K.  Starting some urine, see if can measure.  If stable, transition to IHD on Mon. 2 Cirrhosis  3 Anemia give Fe with esa 4 DM 5 Massive obesity 6 [place] P CRRT, esa, Fe, measure urine    LOS: 9 days   Loretta [name] L 08/11/2016,8:11 AM

## 2016-08-11 NOTE — Progress Notes (Signed)
PULMONARY  / CRITICAL CARE MEDICINE  Name: Loretta [name] MRN: [id number] DOB: 10/16/1955    LOS: 34  REFERRING MD :  Nephrology  CHIEF COMPLAINT:  Renal failure  BRIEF PATIENT DESCRIPTION: Loretta [name] is a 61 year old woman with h/o gastric bypass, obesity, NASH cirrhosis complicated by acute kidney disease obesity transferred to the ICU for HD initiation.   LINES / TUBES: Right IJ HD catheter 3/31>>  SIGNIFICANT EVENTS:  3/29 >> admitted 4/1 >> transfer to ICU to start CRRT  DIET:  Renal  DVT Px:  Heparin subQ  GI Px:  None  HISTORY OF PRESENT ILLNESS:  Loretta [name] is a 61 year old woman with h/o gastric bypass, obesity, NASH cirrhosis complicated by acute kidney disease obesity transferred to the ICU for HD initiation. Her renal function did not improve with IV fluids, albumin, and midodrine on the floor.  INTERVAL HISTORY:   No acute issues reported Currently off phenylephrine Tolerating CVVHD with volume removal Anemic this morning  VITAL SIGNS: Temp:  [97.8 F (36.6 C)-99.1 F (37.3 C)] 99 F (37.2 C) (04/07 0900) Pulse Rate:  [78-100] 100 (04/07 0910) Resp:  [13-35] 16 (04/07 0910) BP: (80-124)/(31-91) 105/31 (04/07 0910) SpO2:  [95 %-100 %] 100 % (04/07 0910) Weight:  [129 kg (284 lb 6.3 oz)] 129 kg (284 lb 6.3 oz) (04/07 0500) HEMODYNAMICS:     INTAKE / OUTPUT: Intake/Output      04/06 0701 - 04/07 0700 04/07 0701 - 04/08 0700   P.O. 760 180   I.V. (mL/kg) 47 (0.4)    Blood  280   IV Piggyback 172    Total Intake(mL/kg) 979 (7.6) 460 (3.6)   Urine (mL/kg/hr) 0 (0)    Other 4300 (1.4) 201 (0.6)   Stool 55 (0)    Total Output 4355 201   Net -3376 +259        Urine Occurrence 3 x    Stool Occurrence 6 x      PHYSICAL EXAMINATION: Physical Exam  Constitutional: She is oriented to person, place, and time. No distress.  HENT:  Head: Normocephalic and atraumatic.  Eyes: Conjunctivae are normal. No scleral icterus.  Neck: Neck supple.   Cardiovascular: Normal rate and regular rhythm.   Murmur heard. Pulmonary/Chest: Effort normal. No respiratory distress.  Abdominal: Soft. She exhibits no distension.  Musculoskeletal: Edema: 2-3+ pitting of both lower extremities.  Neurological: She is alert and oriented to person, place, and time.  Skin: Skin is warm and dry. She is not diaphoretic.     LABS: Cbc  Recent Labs Lab 08/09/16 0330 08/09/16 0927 08/10/16 0300 08/11/16 0430  WBC 4.1  --  3.9* 4.1  HGB 7.2*  --  7.0* 6.8*  HCT 21.8* 21.6* 21.4* 20.6*  PLT 64*  --  58* 61*    Chemistry   Recent Labs Lab 08/09/16 0345 08/10/16 0300 08/10/16 1920 08/11/16 0430  NA 137 137 134* 136  K 4.1 4.0 4.3 4.2  CL 105 104 103 102  CO2 '27 26 25 25  '$ BUN 7 6 5* 5*  CREATININE 2.07* 1.87* 1.98* 1.92*  CALCIUM 8.0* 8.0* 8.2* 8.4*  MG 2.4 2.4  --  2.4  PHOS 1.9* 2.2* 2.8 2.9  GLUCOSE 72 72 105* 71    Liver fxn  Recent Labs Lab 08/10/16 0300 08/10/16 1920 08/11/16 0430  ALBUMIN 2.9* 3.1* 2.8*     DIAGNOSES: Active Problems:   Liver cirrhosis secondary to NASH ([place])   Acute on chronic renal  failure (HCC)   ARF (acute renal failure) (HCC)   Colitis   Hypertension   COPD (chronic obstructive pulmonary disease) (HCC)   Nephrolithiasis   Depression   Thrombocytopenia (HCC)   Morbid obesity (HCC)   CKD (chronic kidney disease) stage 4, GFR 15-29 ml/min (HCC)   Hypotension   ASSESSMENT / PLAN:  PULMONARY  ASSESSMENT:  OSA COPD  PLAN:    No longer on BiPAP Could benefit from nocturnal positive pressure, although poorly compliant at home Not currently requiring bronchodilators   CARDIOVASCULAR  ASSESSMENT:  History of Hypertension:  Hypotension, due to hepatic disease and aggressive volume removal  PLAN:  Continue Midodrine as ordered Phenylephrine as needed in order to tolerate CVVHD   RENAL ASSESSMENT:   Acute on chronic kidney disease Stage 3: Acute tubular necrosis vs  hepatorenal syndrome in the setting of acute blood loss anemia from hematochezia, hypovolemia, intermittent hypotension.   PLAN:   Continue CVVHD through Sunday, then potentially transitioned to intermittent HD. 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   PLAN:  Continue to try to minimize blood draws Recheck CBC this p.m., may need 1 unit PRBC No evidence for active bleeding   NEUROLOGIC  ASSESSMENT:   Depression and anxiety Headache, neck pain Insomnia  PLAN:   Continue venlafaxine Continue tramadol and Flexeril Continue Ambien   Independent CC time 32 minutes  Baltazar Apo, MD, PhD 08/11/2016, 9:40 AM [place] [place] [facility] [phone] or if no answer [phone]

## 2016-08-11 NOTE — Progress Notes (Signed)
CRITICAL VALUE ALERT  Critical value received:  Hemoglobin 6.8  Date of notification:  08/11/16  Time of notification:  0510  Critical value read back:Yes.    Nurse who received alert:  Ezekiel Slocumb, RN  MD notified (1st page):  Dr. Catha Gosselin MD  Time of first page:  [phone]  Responding MD:  Dr. Catha Gosselin MD  Time MD responded:  [phone]

## 2016-08-11 NOTE — Progress Notes (Signed)
[place] Progress Note Patient Name: Loretta [name] DOB: [birth year] MRN: [id number]   Date of Service  08/11/2016  HPI/Events of Note  Nurse reports that the patient became confused and disoriented with Ambien for sleep last evening. Nurse request Benadryl   eICU Interventions  Will order: 1. Benadryl 25 mg PO X 1 at 10 PM.     Intervention Category Minor Interventions: Agitation / anxiety - evaluation and management  Lysle Dingwall 08/11/2016, 9:26 PM

## 2016-08-12 LAB — CBC
HCT: 22.7 % — ABNORMAL LOW (ref 36.0–46.0)
Hemoglobin: 7.6 g/dL — ABNORMAL LOW (ref 12.0–15.0)
MCH: 30 pg (ref 26.0–34.0)
MCHC: 33.5 g/dL (ref 30.0–36.0)
MCV: 89.7 fL (ref 78.0–100.0)
PLATELETS: 54 10*3/uL — AB (ref 150–400)
RBC: 2.53 MIL/uL — AB (ref 3.87–5.11)
RDW: 19.1 % — ABNORMAL HIGH (ref 11.5–15.5)
WBC: 4.5 10*3/uL (ref 4.0–10.5)

## 2016-08-12 LAB — BPAM RBC
Blood Product Expiration Date: 201804112359
ISSUE DATE / TIME: 201804070712
UNIT TYPE AND RH: 600

## 2016-08-12 LAB — TYPE AND SCREEN
ABO/RH(D): AB POS
Antibody Screen: NEGATIVE
UNIT DIVISION: 0

## 2016-08-12 LAB — RENAL FUNCTION PANEL
ALBUMIN: 2.9 g/dL — AB (ref 3.5–5.0)
ALBUMIN: 2.9 g/dL — AB (ref 3.5–5.0)
ANION GAP: 8 (ref 5–15)
Anion gap: 5 (ref 5–15)
BUN: 6 mg/dL (ref 6–20)
BUN: <5 mg/dL — ABNORMAL LOW (ref 6–20)
CALCIUM: 8.4 mg/dL — AB (ref 8.9–10.3)
CALCIUM: 8.3 mg/dL — AB (ref 8.9–10.3)
CO2: 26 mmol/L (ref 22–32)
CO2: 27 mmol/L (ref 22–32)
CREATININE: 1.58 mg/dL — AB (ref 0.44–1.00)
Chloride: 102 mmol/L (ref 101–111)
Chloride: 104 mmol/L (ref 101–111)
Creatinine, Ser: 1.87 mg/dL — ABNORMAL HIGH (ref 0.44–1.00)
GFR calc Af Amer: 33 mL/min — ABNORMAL LOW (ref >60)
GFR calc Af Amer: 40 mL/min — ABNORMAL LOW (ref >60)
GFR calc non Af Amer: 35 mL/min — ABNORMAL LOW (ref >60)
GFR, EST NON AFRICAN AMERICAN: 28 mL/min — AB (ref >60)
GLUCOSE: 96 mg/dL (ref 65–99)
Glucose, Bld: 75 mg/dL (ref 65–99)
PHOSPHORUS: 2.1 mg/dL — AB (ref 2.5–4.6)
POTASSIUM: 4 mmol/L (ref 3.5–5.1)
Phosphorus: 2.9 mg/dL (ref 2.5–4.6)
Potassium: 3.9 mmol/L (ref 3.5–5.1)
SODIUM: 136 mmol/L (ref 135–145)
SODIUM: 136 mmol/L (ref 135–145)

## 2016-08-12 LAB — MAGNESIUM
MAGNESIUM: 2.4 mg/dL (ref 1.7–2.4)
MAGNESIUM: 2.5 mg/dL — AB (ref 1.7–2.4)

## 2016-08-12 MED ORDER — ONDANSETRON HCL 4 MG/2ML IJ SOLN
4.0000 mg | Freq: Three times a day (TID) | INTRAMUSCULAR | Status: DC | PRN
  Administered 2016-08-12 – 2016-08-18 (×2): 4 mg via INTRAVENOUS
  Filled 2016-08-12 (×2): qty 2

## 2016-08-12 MED ORDER — SODIUM PHOSPHATES 45 MMOLE/15ML IV SOLN
30.0000 mmol | Freq: Once | INTRAVENOUS | Status: AC
  Administered 2016-08-12: 30 mmol via INTRAVENOUS
  Filled 2016-08-12: qty 10

## 2016-08-12 NOTE — Progress Notes (Signed)
PULMONARY  / CRITICAL CARE MEDICINE  Name: Jama Mcmiller MRN: [id number] DOB: [birth year]    LOS: 65  REFERRING MD :  Nephrology  CHIEF COMPLAINT:  Renal failure  BRIEF PATIENT DESCRIPTION: Loretta [name] is a 61 year old woman with h/o gastric bypass, obesity, NASH cirrhosis complicated by acute kidney disease obesity transferred to the ICU for HD initiation.   LINES / TUBES: Right IJ HD catheter 3/31>>  SIGNIFICANT EVENTS:  3/29 >> admitted 4/1 >> transfer to ICU to start CRRT  DIET:  Renal  DVT Px:  Heparin subQ  GI Px:  None  HISTORY OF PRESENT ILLNESS:  Loretta [name] is a 62 year old woman with h/o gastric bypass, obesity, NASH cirrhosis complicated by acute kidney disease obesity transferred to the ICU for HD initiation. Her renal function did not improve with IV fluids, albumin, and midodrine on the floor.   INTERVAL HISTORY:  This morning, she is sleepy though is oriented and answers questions appropriately. She received Benadryl overnight to help with sleep along with Flexeril.   VITAL SIGNS: Temp:  [97.6 F (36.4 C)-98.1 F (36.7 C)] 98 F (36.7 C) (04/08 0800) Pulse Rate:  [83-93] 93 (04/08 0800) Resp:  [14-26] 19 (04/08 1000) BP: (80-126)/(33-92) 80/55 (04/08 1000) SpO2:  [96 %-100 %] 100 % (04/08 1000) Weight:  [278 lb 9.6 oz (126.4 kg)] 278 lb 9.6 oz (126.4 kg) (04/08 0500)  HEMODYNAMICS:     INTAKE / OUTPUT: Intake/Output      04/07 0701 - 04/08 0700 04/08 0701 - 04/09 0700   P.O. 970 440   I.V. (mL/kg)     Blood 280    IV Piggyback 117 260   Total Intake(mL/kg) 1367 (10.8) 700 (5.5)   Urine (mL/kg/hr) 40 (0)    Other 3711 (1.2) 398 (1)   Stool 4 (0)    Total Output 3755 398   Net -2388 +302          PHYSICAL EXAMINATION: Physical Exam  Constitutional: She is oriented to person, place, and time. No distress.  HENT:  Head: Normocephalic and atraumatic.  Eyes: Conjunctivae are normal. No scleral icterus.  Cardiovascular: Normal rate and  regular rhythm.   Murmur appreciated at the left lower sternal border  Pulmonary/Chest: Effort normal and breath sounds normal.  Abdominal: Soft. Bowel sounds are normal.  Musculoskeletal: She exhibits edema (1-2+ pitting edema of the bilateral lower extremities).  Neurological: She is alert and oriented to person, place, and time.  Sleepy  Skin: Skin is warm and dry. She is not diaphoretic.    LABS: Cbc  Recent Labs Lab 08/11/16 0430 08/11/16 2000 08/12/16 0407  WBC 4.1 4.2 4.5  HGB 6.8* 7.3* 7.6*  HCT 20.6* 22.0* 22.7*  PLT 61* 57* 54*    Chemistry   Recent Labs Lab 08/10/16 0300  08/11/16 0430 08/11/16 1600 08/12/16 0407 08/12/16 0500  NA 137  < > 136 136  --  136  K 4.0  < > 4.2 4.0  --  4.0  CL 104  < > 102 103  --  102  CO2 26  < > 25 26  --  26  BUN 6  < > 5* 5*  --  6  CREATININE 1.87*  < > 1.92* 1.77*  --  1.87*  CALCIUM 8.0*  < > 8.4* 8.8*  --  8.4*  MG 2.4  --  2.4  --  2.4  --   PHOS 2.2*  < > 2.9 2.2*  --  2.1*  GLUCOSE 72  < > 71 94  --  75  < > = values in this interval not displayed.  Liver fxn  Recent Labs Lab 08/11/16 0430 08/11/16 1600 08/12/16 0500  ALBUMIN 2.8* 3.0* 2.9*     DIAGNOSES: Active Problems:   Liver cirrhosis secondary to NASH (HCC)   Acute on chronic renal failure (HCC)   ARF (acute renal failure) (HCC)   Colitis   Hypertension   COPD (chronic obstructive pulmonary disease) (HCC)   Nephrolithiasis   Depression   Thrombocytopenia (HCC)   Morbid obesity (HCC)   CKD (chronic kidney disease) stage 4, GFR 15-29 ml/min (HCC)   Hypotension   ASSESSMENT / PLAN: Loretta [name] is a 61 year old woman with h/o gastric bypass, obesity, NASH cirrhosis complicated by acute kidney injury on CRRT.  Acute on chronic kidney disease Stage 3: Acute tubular necrosis vs hepatorenal syndrome in the setting of acute blood loss anemia from hematochezia, hypovolemia, intermittent hypotension.  -Transition off CRRT per Nephrology. Unclear  interval. -Continue Midodrine as ordered -Phenylephrine as needed in order to tolerate CVVHD  COPD: Not on home therapy. Bronchodilators as tolerated.  OSA: CPAP as needed.  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 Received pRBC x 1.  Thrombocytopenia: Platelets 54 this morning, likely from chronic liver disease.  Depression and anxiety: Continue venlafaxine and tramadol  Headache: Continue tramadol and Flexeril. -Be cautious with Benadryl prn as both it and Flexeril have anticholinergic properties which may worsen mental status  Charlott [name], PGY3 Internal Medicine Pager: [phone]  Attending Note:  I have examined patient, reviewed labs, studies and notes. I have discussed the case with Dr Posey Pronto, and I agree with the data and plans as amended above.  61 year old obese woman with a history of gastric bypass, Nash cirrhosis. She was admitted with colitis and combined septic/hemorrhagic shock resulting in acute on chronic renal failure. She has been in the ICU on CVVHD for renal support and volume removal. On evaluation she is awake, alert, up to chair. A little less energetic than yesterday. Lungs are distant but clear. Heart regular. Abdomen obese extremities with 1+ edema. We will plan for her to transition off of CVVHD when this is okay with nephrology, probably today. Continue phenylephrine as needed in order to facilitate CVVHD. Recommend CPAP daily at bedtime. She is more lethargic today likely due to medications including Benadryl and Flexeril. We will need to be careful with these. If she comes off CVVH we will assess renal function to look for renal recovery. Independent critical care time is 31 minutes.   Baltazar Apo, MD, PhD 08/12/2016, 2:26 PM [facility] [phone] or if no answer [phone]

## 2016-08-12 NOTE — Plan of Care (Signed)
Problem: Safety: Goal: Ability to remain free from injury will improve Outcome: Progressing Up oob w/o complications   Problem: Health Behavior/Discharge Planning: Goal: Ability to manage health-related needs will improve Outcome: Progressing Education ongoing  Problem: Pain Managment: Goal: General experience of comfort will improve Outcome: Progressing Minimal c/o pain  Problem: Physical Regulation: Goal: Ability to maintain clinical measurements within normal limits will improve Outcome: Progressing Within set parameters at this time  Problem: Skin Integrity: Goal: Risk for impaired skin integrity will decrease Outcome: Progressing MASD healing

## 2016-08-12 NOTE — Progress Notes (Signed)
[facility] Progress Note Patient Name: Loneta Tamplin DOB: 02/18/1956 MRN: [id number]   Date of Service  08/12/2016  HPI/Events of Note  c/o of nausea  eICU Interventions  PRN zofran     Intervention Category Minor Interventions: Routine modifications to care plan (e.g. PRN medications for pain, fever)  Ravindra Baranek 08/12/2016, 3:39 AM

## 2016-08-12 NOTE — Progress Notes (Signed)
Subjective: Interval History: has no complaint .  Objective: Vital signs in last 24 hours: Temp:  [97.6 F (36.4 C)-99 F (37.2 C)] 98.1 F (36.7 C) (04/08 0400) Pulse Rate:  [83-100] 88 (04/08 0600) Resp:  [14-23] 14 (04/08 0600) BP: (92-126)/(31-92) 93/57 (04/08 0600) SpO2:  [96 %-100 %] 100 % (04/08 0600) Weight:  [126.4 kg (278 lb 9.6 oz)] 126.4 kg (278 lb 9.6 oz) (04/08 0500) Weight change: -2.628 kg (-5 lb 12.7 oz)  Intake/Output from previous day: 04/07 0701 - 04/08 0700 In: 1367 [P.O.:970; Blood:280; IV Piggyback:117] Out: 0964 [Urine:30; Stool:2] Intake/Output this shift: No intake/output data recorded.  General appearance: alert, cooperative, no distress, morbidly obese and pale Neck: IJ cath Resp: diminished breath sounds bilaterally and rales bibasilar Cardio: regular rate and rhythm, S1, S2 normal and systolic murmur: holosystolic 2/6, blowing at apex GI: massive, obese, pos bs, abdm wall edema,  Extremities: edema 3+  Lab Results:  Recent Labs  08/11/16 2000 08/12/16 0407  WBC 4.2 4.5  HGB 7.3* 7.6*  HCT 22.0* 22.7*  PLT 57* 54*   BMET:  Recent Labs  08/11/16 1600 08/12/16 0500  NA 136 136  K 4.0 4.0  CL 103 102  CO2 26 26  GLUCOSE 94 75  BUN 5* 6  CREATININE 1.77* 1.87*  CALCIUM 8.8* 8.4*    Recent Labs  08/10/16 0907  PTH 127*   Iron Studies:  Recent Labs  08/09/16 0903  IRON 39  TIBC 255    Studies/Results: No results found.  I have reviewed the patient's current medications.  Assessment/Plan: 1 AKI/CKD 3-4  Presumably, ischemic aTN.  Vol xs yet but down 22 kg.  Acid/base/k ok.  Replete phos. If stable convert to IHD 2 Anemia esa/fe 3 DM 4 Massive obesity 5 Cirrhosis 6 HPTH vit D P CRRT, stop soon, esa, Fe, phos,     LOS: 10 days   Loretta [name] L 08/12/2016,7:08 AM

## 2016-08-13 DIAGNOSIS — N179 Acute kidney failure, unspecified: Secondary | ICD-10-CM

## 2016-08-13 LAB — RENAL FUNCTION PANEL
ALBUMIN: 2.9 g/dL — AB (ref 3.5–5.0)
ALBUMIN: 2.9 g/dL — AB (ref 3.5–5.0)
Anion gap: 8 (ref 5–15)
Anion gap: 6 (ref 5–15)
BUN: 6 mg/dL (ref 6–20)
CO2: 26 mmol/L (ref 22–32)
CO2: 26 mmol/L (ref 22–32)
CREATININE: 1.72 mg/dL — AB (ref 0.44–1.00)
Calcium: 8.3 mg/dL — ABNORMAL LOW (ref 8.9–10.3)
Calcium: 8.5 mg/dL — ABNORMAL LOW (ref 8.9–10.3)
Chloride: 102 mmol/L (ref 101–111)
Chloride: 104 mmol/L (ref 101–111)
Creatinine, Ser: 2.37 mg/dL — ABNORMAL HIGH (ref 0.44–1.00)
GFR calc Af Amer: 36 mL/min — ABNORMAL LOW (ref >60)
GFR calc Af Amer: 25 mL/min — ABNORMAL LOW (ref >60)
GFR calc non Af Amer: 21 mL/min — ABNORMAL LOW (ref >60)
GFR, EST NON AFRICAN AMERICAN: 31 mL/min — AB (ref >60)
GLUCOSE: 79 mg/dL (ref 65–99)
GLUCOSE: 98 mg/dL (ref 65–99)
PHOSPHORUS: 2.2 mg/dL — AB (ref 2.5–4.6)
PHOSPHORUS: 2.8 mg/dL (ref 2.5–4.6)
POTASSIUM: 5.1 mmol/L (ref 3.5–5.1)
Potassium: 3.9 mmol/L (ref 3.5–5.1)
SODIUM: 136 mmol/L (ref 135–145)
Sodium: 136 mmol/L (ref 135–145)

## 2016-08-13 LAB — CBC
HEMATOCRIT: 22.1 % — AB (ref 36.0–46.0)
Hemoglobin: 7.3 g/dL — ABNORMAL LOW (ref 12.0–15.0)
MCH: 29.7 pg (ref 26.0–34.0)
MCHC: 33 g/dL (ref 30.0–36.0)
MCV: 89.8 fL (ref 78.0–100.0)
Platelets: 49 10*3/uL — ABNORMAL LOW (ref 150–400)
RBC: 2.46 MIL/uL — ABNORMAL LOW (ref 3.87–5.11)
RDW: 19 % — AB (ref 11.5–15.5)
WBC: 4.4 10*3/uL (ref 4.0–10.5)

## 2016-08-13 LAB — MAGNESIUM: MAGNESIUM: 2.4 mg/dL (ref 1.7–2.4)

## 2016-08-13 MED ORDER — HEPARIN SODIUM (PORCINE) 1000 UNIT/ML DIALYSIS
1000.0000 [IU] | INTRAMUSCULAR | Status: DC | PRN
  Administered 2016-08-13: 3000 [IU] via INTRAVENOUS_CENTRAL
  Filled 2016-08-13: qty 3
  Filled 2016-08-13 (×2): qty 6

## 2016-08-13 MED ORDER — LORATADINE 10 MG PO TABS
10.0000 mg | ORAL_TABLET | Freq: Every day | ORAL | Status: DC | PRN
  Administered 2016-08-13 – 2016-08-17 (×2): 10 mg via ORAL
  Filled 2016-08-13 (×3): qty 1

## 2016-08-13 MED ORDER — ALBUTEROL SULFATE (2.5 MG/3ML) 0.083% IN NEBU: 2.5000 mg | INHALATION_SOLUTION | RESPIRATORY_TRACT | Status: DC | PRN

## 2016-08-13 NOTE — Progress Notes (Signed)
qPhysical Therapy Treatment Patient Details Name: Loretta [name] MRN: [id number] DOB: 17-Feb-1956 Today's Date: 08/13/2016    History of Present Illness Pt adm with acute renal failure and CVVHD initiated on 4/1. CVVHD stopped 4/9. PMH - NASH cirrhosis, morbid obesity, gastric bypass    PT Comments    Pt now off of CVVHD. Mobility improving.   Follow Up Recommendations  Home health PT (May not need)     Equipment Recommendations  None recommended by PT    Recommendations for Other Services       Precautions / Restrictions Precautions Precautions: Fall;Other (comment) Precaution Comments: CVVHD lines Restrictions Weight Bearing Restrictions: No    Mobility  Bed Mobility Overal bed mobility: Needs Assistance Bed Mobility: Supine to Sit;Sit to Supine Rolling: Supervision   Supine to sit: Supervision     General bed mobility comments: Assist for lines  Transfers Overall transfer level: Needs assistance Equipment used: 1 person hand held assist Transfers: Sit to/from Stand;Stand Pivot Transfers Sit to Stand: Min guard Stand pivot transfers: Min guard       General transfer comment: Pt from bed to bsc back to bed.  Ambulation/Gait             General Gait Details: Deferred due to pt in the middle of her bath   Stairs            Wheelchair Mobility    Modified Rankin (Stroke Patients Only)       Balance Overall balance assessment: Needs assistance Sitting-balance support: No upper extremity supported;Feet supported Sitting balance-Leahy Scale: Good     Standing balance support: Single extremity supported Standing balance-Leahy Scale: Poor Standing balance comment: UE support                            Cognition Arousal/Alertness: Awake/alert Behavior During Therapy: WFL for tasks assessed/performed Overall Cognitive Status: Within Functional Limits for tasks assessed                                         Exercises      General Comments        Pertinent Vitals/Pain Pain Assessment: No/denies pain    Home Living                      Prior Function            PT Goals (current goals can now be found in the care plan section) Progress towards PT goals: Progressing toward goals    Frequency    Min 3X/week      PT Plan Current plan remains appropriate    Co-evaluation             End of Session   Activity Tolerance: Patient tolerated treatment well Patient left: with call bell/phone within reach;with nursing/sitter in room;in bed Nurse Communication: Mobility status PT Visit Diagnosis: Muscle weakness (generalized) (M62.81);Difficulty in walking, not elsewhere classified (R26.2)     Time: 6384-6659 PT Time Calculation (min) (ACUTE ONLY): 13 min  Charges:  $Gait Training: 8-22 mins                    G Codes:       [facility] PT [place] 08/13/2016, 3:37 PM

## 2016-08-13 NOTE — Progress Notes (Signed)
PULMONARY  / CRITICAL CARE MEDICINE  Name: Loretta [name] MRN: [id number] DOB: [birth year]    LOS: 80  REFERRING MD :  Nephrology  CHIEF COMPLAINT:  Renal failure  BRIEF PATIENT DESCRIPTION: Loretta [name] is a 61 year old woman with h/o gastric bypass, obesity, NASH cirrhosis complicated by acute kidney disease obesity transferred to the ICU for HD initiation.   LINES / TUBES: Right IJ HD catheter 3/31>>  SIGNIFICANT EVENTS:  3/29 >> admitted 4/1 >> transfer to ICU to start CRRT  DIET:  Renal  DVT Px:  Heparin subQ  GI Px:  None  HISTORY OF PRESENT ILLNESS:  Loretta [name] is a 61 year old woman with h/o gastric bypass, obesity, NASH cirrhosis complicated by acute kidney disease obesity transferred to the ICU for HD initiation. Loretta [name] renal function did not improve with IV fluids, albumin, and midodrine on the floor.   INTERVAL HISTORY:  This morning, Loretta [name] is out of bed and setting with Loretta [name] husband at bedside. Loretta [name] slept well overnight though is complaining of some itching.  VITAL SIGNS: Temp:  [96.7 F (35.9 C)-98.3 F (36.8 C)] 97.9 F (36.6 C) (04/09 0700) Pulse Rate:  [85-92] 86 (04/09 0700) Resp:  [12-34] 12 (04/09 0700) BP: (80-127)/(33-69) 88/43 (04/09 0700) SpO2:  [93 %-100 %] 100 % (04/09 0600) Weight:  [276 lb 1.6 oz (125.2 kg)] 276 lb 1.6 oz (125.2 kg) (04/09 0500)  HEMODYNAMICS:     INTAKE / OUTPUT: Intake/Output      04/08 0701 - 04/09 0700 04/09 0701 - 04/10 0700   P.O. 1120 50   Blood     IV Piggyback 260    Total Intake(mL/kg) 1380 (11) 50 (0.4)   Urine (mL/kg/hr) 10 (0)    Other 3739 (1.2) 142 (0.6)   Stool 0 (0)    Total Output 3749 142   Net -2369 -92        Urine Occurrence 1 x    Stool Occurrence 4 x      PHYSICAL EXAMINATION: Physical Exam  Constitutional: Loretta [name] is oriented to person, place, and time. No distress.  HENT:  Head: Normocephalic and atraumatic.  Eyes: Conjunctivae are normal. No scleral icterus.  Neck:  L IJ catheter   Cardiovascular: Normal rate and regular rhythm.   Murmur best appreciated in the left lower sternal border.  Pulmonary/Chest: Effort normal. No respiratory distress.  Abdominal: Soft. Loretta [name] exhibits no distension.  Musculoskeletal: Loretta [name] exhibits edema (1+ pitting edema in the bilateral lower extremities).  Neurological: Loretta [name] is alert and oriented to person, place, and time.  Skin: Loretta [name] is not diaphoretic.    LABS: Cbc  Recent Labs Lab 08/11/16 2000 08/12/16 0407 08/13/16 0438  WBC 4.2 4.5 4.4  HGB 7.3* 7.6* 7.3*  HCT 22.0* 22.7* 22.1*  PLT 57* 54* 49*    Chemistry   Recent Labs Lab 08/12/16 0407 08/12/16 0500 08/12/16 1600 08/13/16 0438  NA  --  136 136 136  K  --  4.0 3.9 3.9  CL  --  102 104 102  CO2  --  '26 27 26  '$ BUN  --  6 <5* <5*  CREATININE  --  1.87* 1.58* 1.72*  CALCIUM  --  8.4* 8.3* 8.3*  MG 2.4  --  2.5* 2.4  PHOS  --  2.1* 2.9 2.2*  GLUCOSE  --  75 96 79    Liver fxn  Recent Labs Lab 08/12/16 0500 08/12/16 1600 08/13/16 0438  ALBUMIN 2.9* 2.9* 2.9*  DIAGNOSES: Active Problems:   Liver cirrhosis secondary to NASH (HCC)   Acute on chronic renal failure (HCC)   ARF (acute renal failure) (HCC)   Colitis   Hypertension   COPD (chronic obstructive pulmonary disease) (HCC)   Nephrolithiasis   Depression   Thrombocytopenia (HCC)   Morbid obesity (HCC)   CKD (chronic kidney disease) stage 4, GFR 15-29 ml/min (HCC)   Hypotension   ASSESSMENT / PLAN: Ms. Reinhold is a 61 year old woman with h/o gastric bypass, obesity, NASH cirrhosis complicated by acute kidney injury on CRRT.  Acute on chronic kidney disease Stage 3: Acute tubular necrosis vs hepatorenal syndrome in the setting of acute blood loss anemia from hematochezia, hypovolemia, intermittent hypotension. -Transition off CRRT per Nephrology today to intermittent HD -Continue Midodrine as ordered -Phenylephrine as needed in order to tolerate CVVHD  COPD: Not on home therapy.  OSA:  CPAP as needed.  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 Received pRBC x 1.  Thrombocytopenia: Platelets 49 this morning, likely from chronic liver disease.  Depression and anxiety: Continue venlafaxine and tramadol  Headache: Continue tramadol and Flexeril.  Itching: Started loratidine 10 mg as needed daily for itching to avoid sedative effects.  Charlott Rakes, PGY3 Internal Medicine Pager: [phone]

## 2016-08-13 NOTE — Progress Notes (Signed)
Assessment/Plan: 1 AKI/CKD 3-4  Presumably, ischemic aTN.  Vol xs yet but down 22 kg 2 Anemia esa/fe 3 DM 4 Massive obesity 5 Cirrhosis 6 HPTH vit D P CRRT, stop   Subjective: Interval History:Feels OK  Objective: Vital signs in last 24 hours: Temp:  [96.7 F (35.9 C)-98.3 F (36.8 C)] 97.9 F (36.6 C) (04/09 0700) Pulse Rate:  [85-92] 86 (04/09 0700) Resp:  [12-34] 12 (04/09 0700) BP: (80-127)/(35-69) 88/43 (04/09 0700) SpO2:  [93 %-100 %] 100 % (04/09 0600) Weight:  [125.2 kg (276 lb 1.6 oz)] 125.2 kg (276 lb 1.6 oz) (04/09 0500) Weight change: -1.134 kg (-2 lb 8 oz)  Intake/Output from previous day: 04/08 0701 - 04/09 0700 In: 1380 [P.O.:1120; IV Piggyback:260] Out: 3749 [Urine:10] Intake/Output this shift: Total I/O In: 50 [P.O.:50] Out: 330 [Other:330]  General appearance: alert and cooperative Resp: clear to auscultation bilaterally Cardio: regular rate and rhythm, S1, S2 normal, no murmur, click, rub or gallop  Lab Results:  Recent Labs  08/12/16 0407 08/13/16 0438  WBC 4.5 4.4  HGB 7.6* 7.3*  HCT 22.7* 22.1*  PLT 54* 49*   BMET:  Recent Labs  08/12/16 1600 08/13/16 0438  NA 136 136  K 3.9 3.9  CL 104 102  CO2 27 26  GLUCOSE 96 79  BUN <5* <5*  CREATININE 1.58* 1.72*  CALCIUM 8.3* 8.3*   No results for input(s): PTH in the last 72 hours. Iron Studies: No results for input(s): IRON, TIBC, TRANSFERRIN, FERRITIN in the last 72 hours. Studies/Results: No results found.  Scheduled: . sodium chloride   Intravenous Once  . Chlorhexidine Gluconate Cloth  6 each Topical Daily  . darbepoetin (ARANESP) injection - NON-DIALYSIS  200 mcg Subcutaneous Q Fri-1800  . ferumoxytol  510 mg Intravenous Weekly  . Gerhardt's butt cream   Topical TID  . heparin  5,000 Units Subcutaneous Q8H  . lactulose  20 g Oral BID WC  . midodrine  10 mg Oral TID WC  . rifaximin  550 mg Oral BID  . sodium chloride flush  10-40 mL Intracatheter Q12H  . venlafaxine XR   75 mg Oral Daily     LOS: 11 days   Jaelen Soth C 08/13/2016,9:43 AM

## 2016-08-14 LAB — RENAL FUNCTION PANEL
ALBUMIN: 2.9 g/dL — AB (ref 3.5–5.0)
ANION GAP: 7 (ref 5–15)
Albumin: 2.9 g/dL — ABNORMAL LOW (ref 3.5–5.0)
Anion gap: 5 (ref 5–15)
BUN: 11 mg/dL (ref 6–20)
BUN: 17 mg/dL (ref 6–20)
CALCIUM: 8.7 mg/dL — AB (ref 8.9–10.3)
CHLORIDE: 103 mmol/L (ref 101–111)
CHLORIDE: 104 mmol/L (ref 101–111)
CO2: 24 mmol/L (ref 22–32)
CO2: 24 mmol/L (ref 22–32)
CREATININE: 3.21 mg/dL — AB (ref 0.44–1.00)
Calcium: 8.7 mg/dL — ABNORMAL LOW (ref 8.9–10.3)
Creatinine, Ser: 4.3 mg/dL — ABNORMAL HIGH (ref 0.44–1.00)
GFR calc Af Amer: 17 mL/min — ABNORMAL LOW (ref >60)
GFR calc Af Amer: 12 mL/min — ABNORMAL LOW (ref >60)
GFR calc non Af Amer: 15 mL/min — ABNORMAL LOW (ref >60)
GFR, EST NON AFRICAN AMERICAN: 10 mL/min — AB (ref >60)
GLUCOSE: 73 mg/dL (ref 65–99)
Glucose, Bld: 99 mg/dL (ref 65–99)
PHOSPHORUS: 3.6 mg/dL (ref 2.5–4.6)
POTASSIUM: 4.1 mmol/L (ref 3.5–5.1)
Phosphorus: 3.2 mg/dL (ref 2.5–4.6)
Potassium: 5 mmol/L (ref 3.5–5.1)
SODIUM: 134 mmol/L — AB (ref 135–145)
Sodium: 133 mmol/L — ABNORMAL LOW (ref 135–145)

## 2016-08-14 LAB — CBC
HEMATOCRIT: 21.7 % — AB (ref 36.0–46.0)
HEMOGLOBIN: 7.3 g/dL — AB (ref 12.0–15.0)
MCH: 30.4 pg (ref 26.0–34.0)
MCHC: 33.6 g/dL (ref 30.0–36.0)
MCV: 90.4 fL (ref 78.0–100.0)
PLATELETS: 55 10*3/uL — AB (ref 150–400)
RBC: 2.4 MIL/uL — ABNORMAL LOW (ref 3.87–5.11)
RDW: 19.2 % — ABNORMAL HIGH (ref 11.5–15.5)
WBC: 5.5 10*3/uL (ref 4.0–10.5)

## 2016-08-14 LAB — MAGNESIUM: Magnesium: 2.6 mg/dL — ABNORMAL HIGH (ref 1.7–2.4)

## 2016-08-14 MED ORDER — HYDROXYZINE HCL 10 MG PO TABS
10.0000 mg | ORAL_TABLET | Freq: Two times a day (BID) | ORAL | Status: DC | PRN
  Administered 2016-08-14 – 2016-08-15 (×2): 10 mg via ORAL
  Filled 2016-08-14 (×7): qty 1

## 2016-08-14 MED ORDER — DIPHENHYDRAMINE HCL 25 MG PO CAPS
25.0000 mg | ORAL_CAPSULE | Freq: Three times a day (TID) | ORAL | Status: DC | PRN
  Administered 2016-08-14: 25 mg via ORAL
  Filled 2016-08-14: qty 1

## 2016-08-14 NOTE — Progress Notes (Signed)
Assessment/Plan: 1 AKI/CKD 3-4 Presumably, ischemic aTN. Vol xs yet but down  2 Anemia esa/fe 3 DM 4 Massive obesity 5 Cirrhosis 6 HPTH vit D P No dialysis today   Subjective: Interval History: neglible UOP  Objective: Vital signs in last 24 hours: Temp:  [98.1 F (36.7 C)-98.5 F (36.9 C)] 98.3 F (36.8 C) (04/10 0400) Pulse Rate:  [88-100] 88 (04/10 0700) Resp:  [12-27] 15 (04/10 0700) BP: (100-133)/(35-66) 103/46 (04/10 0700) SpO2:  [93 %-100 %] 96 % (04/10 0700) Weight:  [123 kg (271 lb 1.6 oz)] 123 kg (271 lb 1.6 oz) (04/10 0238) Weight change: -2.268 kg (-5 lb)  Intake/Output from previous day: 04/09 0701 - 04/10 0700 In: 750 [P.O.:750] Out: 722 [Stool:225] Intake/Output this shift: No intake/output data recorded.  General appearance: alert and cooperative Chest wall: no tenderness Cardio: regular rate and rhythm, S1, S2 normal, no murmur, click, rub or gallop GI: soft, non-tender; bowel sounds normal; no masses,  no organomegaly  Ascites   Lab Results:  Recent Labs  08/13/16 0438 08/14/16 0402  WBC 4.4 5.5  HGB 7.3* 7.3*  HCT 22.1* 21.7*  PLT 49* 55*   BMET:  Recent Labs  08/13/16 1541 08/14/16 0402  NA 136 134*  K 5.1 5.0  CL 104 103  CO2 26 24  GLUCOSE 98 73  BUN 6 11  CREATININE 2.37* 3.21*  CALCIUM 8.5* 8.7*   No results for input(s): PTH in the last 72 hours. Iron Studies: No results for input(s): IRON, TIBC, TRANSFERRIN, FERRITIN in the last 72 hours. Studies/Results: No results found.  Scheduled: . Chlorhexidine Gluconate Cloth  6 each Topical Daily  . darbepoetin (ARANESP) injection - NON-DIALYSIS  200 mcg Subcutaneous Q Fri-1800  . ferumoxytol  510 mg Intravenous Weekly  . Gerhardt's butt cream   Topical TID  . heparin  5,000 Units Subcutaneous Q8H  . lactulose  20 g Oral BID WC  . midodrine  10 mg Oral TID WC  . rifaximin  550 mg Oral BID  . sodium chloride flush  10-40 mL Intracatheter Q12H  . venlafaxine XR  75 mg  Oral Daily    LOS: 12 days   Norlene Lanes C 08/14/2016,7:51 AM

## 2016-08-14 NOTE — Progress Notes (Signed)
qPhysical Therapy Treatment Patient Details Name: Loretta [name] MRN: [id number] DOB: 16-May-1955 Today's Date: 08/14/2016    History of Present Illness Pt adm with acute renal failure and CVVHD initiated on 4/1. CVVHD stopped 4/9. PMH - NASH cirrhosis, morbid obesity, gastric bypass    PT Comments    Pt making steady progress. Fatigues quickly.   Follow Up Recommendations  Home health PT     Equipment Recommendations  None recommended by PT    Recommendations for Other Services       Precautions / Restrictions Precautions Precautions: Fall Restrictions Weight Bearing Restrictions: No    Mobility  Bed Mobility               General bed mobility comments: Up in chair  Transfers Overall transfer level: Needs assistance Equipment used: Rolling walker (2 wheeled) Transfers: Sit to/from Stand Sit to Stand: Min guard         General transfer comment: Verbal cues for hand placement. Assist for safety  Ambulation/Gait Ambulation/Gait assistance: +2 safety/equipment;Min guard (chair follow ) Ambulation Distance (Feet): 50 Feet Assistive device: Rolling walker (2 wheeled) Gait Pattern/deviations: Step-through pattern;Decreased step length - right;Decreased step length - left;Trunk flexed Gait velocity: decr Gait velocity interpretation: Below normal speed for age/gender General Gait Details: Assist for safety. Pt fatigues quickly. Verbal cues for more upright posture.   Stairs            Wheelchair Mobility    Modified Rankin (Stroke Patients Only)       Balance Overall balance assessment: Needs assistance Sitting-balance support: No upper extremity supported;Feet supported Sitting balance-Leahy Scale: Good     Standing balance support: Single extremity supported Standing balance-Leahy Scale: Poor Standing balance comment: UE support                            Cognition Arousal/Alertness: Awake/alert Behavior During Therapy: WFL  for tasks assessed/performed Overall Cognitive Status: Within Functional Limits for tasks assessed                                        Exercises      General Comments        Pertinent Vitals/Pain      Home Living                      Prior Function            PT Goals (current goals can now be found in the care plan section) Progress towards PT goals: Progressing toward goals    Frequency    Min 3X/week      PT Plan Current plan remains appropriate    Co-evaluation             End of Session   Activity Tolerance: Patient limited by fatigue Patient left: with call bell/phone within reach;in chair;with family/visitor present Nurse Communication: Mobility status PT Visit Diagnosis: Muscle weakness (generalized) (M62.81);Difficulty in walking, not elsewhere classified (R26.2)     Time: 0950-1001 PT Time Calculation (min) (ACUTE ONLY): 11 min  Charges:  $Gait Training: 8-22 mins                    G Codes:       [facility] PT [place] 08/14/2016, 2:09 PM

## 2016-08-14 NOTE — Progress Notes (Signed)
PULMONARY  / CRITICAL CARE MEDICINE  Name: Loretta [name] MRN: [id number] DOB: 1956/02/09    LOS: 61  REFERRING MD :  Nephrology  CHIEF COMPLAINT:  Renal failure  BRIEF PATIENT DESCRIPTION: Loretta [name] is a 61 year old woman with h/o gastric bypass, obesity, NASH cirrhosis complicated by acute kidney disease obesity transferred to the ICU for HD initiation.   LINES / TUBES: Right IJ HD catheter 3/31>>  SIGNIFICANT EVENTS:  3/29 >> admitted 4/1 >> transfer to ICU to start CRRT 4/9 off CRRT  DIET:  Renal  DVT Px:  Heparin subQ  GI Px:  None  HISTORY OF PRESENT ILLNESS:  Loretta [name] is a 61 year old woman with h/o gastric bypass, obesity, NASH cirrhosis complicated by acute kidney disease obesity transferred to the ICU for HD initiation. Her renal function did not improve with IV fluids, albumin, and midodrine on the floor.   INTERVAL HISTORY:  Loretta [name] has been off CRRT for ~24 hours now and has tolerated well. Loretta [name] worked with PT this morning and sat in chair for a while. Loretta [name] is fatigued and complaining of itching, but otherwise feels better.   VITAL SIGNS: Temp:  [98 F (36.7 C)-98.5 F (36.9 C)] 98 F (36.7 C) (04/10 0809) Pulse Rate:  [88-100] 88 (04/10 0900) Resp:  [12-27] 18 (04/10 0900) BP: (88-133)/(35-75) 101/61 (04/10 0900) SpO2:  [93 %-100 %] 97 % (04/10 0900) Weight:  [123 kg (271 lb 1.6 oz)] 123 kg (271 lb 1.6 oz) (04/10 0238)  HEMODYNAMICS:     INTAKE / OUTPUT: Intake/Output      04/09 0701 - 04/10 0700 04/10 0701 - 04/11 0700   P.O. 750    IV Piggyback     Total Intake(mL/kg) 750 (6.1)    Urine (mL/kg/hr) 0 (0)    Other 497 (0.2)    Stool 225 (0.1)    Total Output 722     Net +28          Urine Occurrence 1 x    Stool Occurrence 3 x 1 x     PHYSICAL EXAMINATION: Physical Exam  Constitutional: Loretta [name] is oriented to person, place, and time. No distress.  HENT:  Head: Normocephalic and atraumatic.  Eyes: Conjunctivae are normal. No scleral icterus.   Neck:  R IJ catheter  Cardiovascular: Normal rate and regular rhythm.   Murmur best appreciated in the left lower sternal border.  Pulmonary/Chest: Effort normal. No respiratory distress.  Abdominal: Soft. Loretta [name] exhibits no distension.  Musculoskeletal: Loretta [name] exhibits edema (1+ pitting edema in the bilateral lower extremities).  Neurological: Loretta [name] is alert and oriented to person, place, and time.  Skin: Loretta [name] is not diaphoretic.    LABS: Cbc  Recent Labs Lab 08/12/16 0407 08/13/16 0438 08/14/16 0402  WBC 4.5 4.4 5.5  HGB 7.6* 7.3* 7.3*  HCT 22.7* 22.1* 21.7*  PLT 54* 49* 55*    Chemistry   Recent Labs Lab 08/12/16 1600 08/13/16 0438 08/13/16 1541 08/14/16 0402  NA 136 136 136 134*  K 3.9 3.9 5.1 5.0  CL 104 102 104 103  CO2 '27 26 26 24  '$ BUN <5* <5* 6 11  CREATININE 1.58* 1.72* 2.37* 3.21*  CALCIUM 8.3* 8.3* 8.5* 8.7*  MG 2.5* 2.4  --  2.6*  PHOS 2.9 2.2* 2.8 3.2  GLUCOSE 96 79 98 73    Liver fxn  Recent Labs Lab 08/13/16 0438 08/13/16 1541 08/14/16 0402  ALBUMIN 2.9* 2.9* 2.9*     DIAGNOSES: Active Problems:  Liver cirrhosis secondary to NASH (HCC)   Acute on chronic renal failure (HCC)   ARF (acute renal failure) (HCC)   Colitis   Hypertension   COPD (chronic obstructive pulmonary disease) (HCC)   Nephrolithiasis   Depression   Thrombocytopenia (HCC)   Morbid obesity (HCC)   CKD (chronic kidney disease) stage 4, GFR 15-29 ml/min (HCC)   Hypotension   Acute renal failure (ARF) (HCC)   ASSESSMENT / PLAN: Loretta [name] is a 61 year old woman with h/o gastric bypass, obesity, NASH cirrhosis who initially was admitted by the hospitalists for acute on chronic renal failure. This was felt to be secondary to ABLA in setting of GIB and also new diuretics. Loretta [name] failed conservative therapies and was transferred to the ICU for CRRT (hypotensive). Loretta [name] remained on CRRT for 9 days and was able to come off 4/9.  Acute on chronic kidney disease Stage 3: Acute  tubular necrosis vs hepatorenal syndrome in the setting of acute blood loss. -CRRT off yesterday -Continue midodrine -DC Phenylphrine, has not needed. -Neprhology following  COPD without acute exacerbation: Not on home therapy. PRN bornchodilators  OSA: CPAP as needed.  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 Undergoing transplant workup at [facility], however not felt to be a candidate due to obesity at this time.  Chronic normocytic anemia: Baseline Hb 7-8.   Thrombocytopenia: in setting of CLD - Platelets slowly rising  Depression and anxiety: Continue venlafaxine and tramadol  Headache: Continue tramadol and Flexeril.  Itching: Loartidine, starting hydroxyzine as Loretta [name] uses this with benefit at home. Have dosed conservatively with concern for sedative effects with renal failure.  Patient updated  Will plan for transfer to medical floor and transfer to [facility] service for 4/11.  Georgann Housekeeper, AGACNP-BC [facility] Pulmonology/Critical Care Pager [phone] or [phone]  08/14/2016 11:23 AM

## 2016-08-14 NOTE — Progress Notes (Signed)
Pt transferred from [facility] this pm, condition stable. Alert, oriented and able to voice needs. No concerns expressed at this time

## 2016-08-14 NOTE — Progress Notes (Signed)
[place] Progress Note Patient Name: Loretta [name] DOB: May 16, 1955 MRN: [id number]   Date of Service  08/14/2016  HPI/Events of Note  Generalized itching No rash  eICU Interventions  Continue Claritin Benadryl PRN     Intervention Category Minor Interventions: Other:  Nadav Swindell 08/14/2016, 4:52 AM

## 2016-08-15 ENCOUNTER — Inpatient Hospital Stay (HOSPITAL_COMMUNITY): Payer: Medicare HMO

## 2016-08-15 DIAGNOSIS — N184 Chronic kidney disease, stage 4 (severe): Secondary | ICD-10-CM

## 2016-08-15 DIAGNOSIS — N200 Calculus of kidney: Secondary | ICD-10-CM

## 2016-08-15 DIAGNOSIS — I10 Essential (primary) hypertension: Secondary | ICD-10-CM

## 2016-08-15 LAB — RENAL FUNCTION PANEL
ALBUMIN: 3 g/dL — AB (ref 3.5–5.0)
ANION GAP: 7 (ref 5–15)
ANION GAP: 11 (ref 5–15)
Albumin: 2.9 g/dL — ABNORMAL LOW (ref 3.5–5.0)
BUN: 21 mg/dL — AB (ref 6–20)
BUN: 24 mg/dL — AB (ref 6–20)
CO2: 24 mmol/L (ref 22–32)
CO2: 22 mmol/L (ref 22–32)
Calcium: 8.8 mg/dL — ABNORMAL LOW (ref 8.9–10.3)
Calcium: 9.1 mg/dL (ref 8.9–10.3)
Chloride: 102 mmol/L (ref 101–111)
Chloride: 101 mmol/L (ref 101–111)
Creatinine, Ser: 4.51 mg/dL — ABNORMAL HIGH (ref 0.44–1.00)
Creatinine, Ser: 4.9 mg/dL — ABNORMAL HIGH (ref 0.44–1.00)
GFR calc Af Amer: 11 mL/min — ABNORMAL LOW (ref >60)
GFR, EST AFRICAN AMERICAN: 10 mL/min — AB (ref >60)
GFR, EST NON AFRICAN AMERICAN: 10 mL/min — AB (ref >60)
GFR, EST NON AFRICAN AMERICAN: 9 mL/min — AB (ref >60)
Glucose, Bld: 67 mg/dL (ref 65–99)
Glucose, Bld: 88 mg/dL (ref 65–99)
PHOSPHORUS: 3.8 mg/dL (ref 2.5–4.6)
PHOSPHORUS: 3.7 mg/dL (ref 2.5–4.6)
POTASSIUM: 4.1 mmol/L (ref 3.5–5.1)
POTASSIUM: 4.1 mmol/L (ref 3.5–5.1)
Sodium: 133 mmol/L — ABNORMAL LOW (ref 135–145)
Sodium: 134 mmol/L — ABNORMAL LOW (ref 135–145)

## 2016-08-15 LAB — CBC
HCT: 21.4 % — ABNORMAL LOW (ref 36.0–46.0)
Hemoglobin: 7.1 g/dL — ABNORMAL LOW (ref 12.0–15.0)
MCH: 30.5 pg (ref 26.0–34.0)
MCHC: 33.2 g/dL (ref 30.0–36.0)
MCV: 91.8 fL (ref 78.0–100.0)
Platelets: 68 10*3/uL — ABNORMAL LOW (ref 150–400)
RBC: 2.33 MIL/uL — AB (ref 3.87–5.11)
RDW: 19.6 % — AB (ref 11.5–15.5)
WBC: 5.4 10*3/uL (ref 4.0–10.5)

## 2016-08-15 LAB — PREPARE RBC (CROSSMATCH)

## 2016-08-15 LAB — MAGNESIUM: MAGNESIUM: 2.6 mg/dL — AB (ref 1.7–2.4)

## 2016-08-15 MED ORDER — FUROSEMIDE 10 MG/ML IJ SOLN
20.0000 mg | Freq: Once | INTRAMUSCULAR | Status: DC
  Filled 2016-08-15 (×3): qty 2

## 2016-08-15 MED ORDER — SODIUM CHLORIDE 0.9 % IV SOLN: Freq: Once | INTRAVENOUS | Status: DC

## 2016-08-15 MED ORDER — ALPRAZOLAM 0.25 MG PO TABS
0.2500 mg | ORAL_TABLET | Freq: Three times a day (TID) | ORAL | Status: DC | PRN
  Filled 2016-08-15: qty 1

## 2016-08-15 MED ORDER — HYDROXYZINE HCL 10 MG PO TABS
10.0000 mg | ORAL_TABLET | Freq: Three times a day (TID) | ORAL | Status: DC | PRN
  Administered 2016-08-15 – 2016-08-19 (×6): 10 mg via ORAL
  Filled 2016-08-15 (×12): qty 1

## 2016-08-15 NOTE — Plan of Care (Signed)
Problem: Skin Integrity: Goal: Risk for impaired skin integrity will decrease Outcome: Progressing Using Gerhardt cream on buttocks area.

## 2016-08-15 NOTE — Progress Notes (Addendum)
Paged MD due to patient having 2 more bloody bowel movements. Also let him know patient was very anxious about this and was requesting an anxiety medicine. Will continue to follow.

## 2016-08-15 NOTE — Progress Notes (Signed)
08/15/2016 4:27 PM  Pt having bloody BMs. Will transfuse 2  Units PRBC.  Follow Hg.   Vitals:   08/15/16 0454 08/15/16 1221  BP: (!) 106/50 (!) 124/50  Pulse: 78 85  Resp: 19   Temp: 98.3 F (36.8 C)     Murvin Natal, MD

## 2016-08-15 NOTE — Progress Notes (Signed)
[name] MRN: [id number] DOB: 11/14/1955   Cancelled Treatment:    Reason Eval/Treat Not Completed: Patient declined, no reason specified;Fatigue/lethargy limiting ability to participate (Patient reports very tired today, asks to defer therapy.)  Spoke with nursing, who endorses patient feeling less energetic today following blood in stool, has also declined Mobility Tech assistance today.  Continue as able tomorrow.   Loretta [name], [name] 08/15/2016, 2:42 PM

## 2016-08-15 NOTE — Care Management Important Message (Signed)
Important Message  Patient Details  Name: Loretta [name] MRN: [id number] Date of Birth: 25-Nov-1955   Medicare Important Message Given:  Yes    Orbie Pyo 08/15/2016, 12:27 PM

## 2016-08-15 NOTE — Progress Notes (Signed)
Patient's hgb resulted as 7.1, asked MD Wynetta Emery if we should still give blood transfusion because patient may possibly have Dialysis tomorrow, and he said we will hold off until tomorrow unless hgb drops further.

## 2016-08-15 NOTE — Progress Notes (Signed)
08/15/2016 5:43 PM  Hg 7.1.  I spoke with RN and she says that she only saw blood with the first BM but the second 2 BMs the blood was patient reported. Since the Hg is stable now will hold transfusion and recheck Hg in AM and can transfuse during HD tomorrow.   Murvin Natal, MD

## 2016-08-15 NOTE — Progress Notes (Signed)
Patient had a moderate amount of blood in the stool, paged MD Wynetta Emery, will await new orders. VSS, patient not symptomatic. Will continue to monitor.

## 2016-08-15 NOTE — Progress Notes (Signed)
PROGRESS NOTE    Loretta [name]  MBW:[id number]  DOB: [birth year]  DOA: 08/02/2016 PCP: Cyndy Freeze, MD Outpatient Specialists:  Hospital course: HPI: Loretta [name] is a 61 y.o. female with medical history significant of liver cirrhosis secondary to NASH, history of edema treated with Lasix. Presenting today after patient was found to have elevated serum creatinine level at her primary care's office. Patient was recently treated for colitis of which she was sent home with Cipro and Flagyl. She completed 12 out of the 14 days of treatment. She still has some abdominal discomfort. Otherwise patient reports no new complaints. Patient denies any fevers.  61 yo MO(273lbs) hx of gastric bypass, non acholic cirrhosis , acute on chronic renal dz, not a candidate for liver tx due to obesity, who is moved to ICU for HD and HD cath placement. PCCM asked to follow in ICU. She is awake and alert and in NAD. RIJ HD catheter was placed and HD to be instituted per renal.  Assessment & Plan:   Acute on chronic kidney disease Stage 3: Acute tubular necrosis vs hepatorenal syndrome in the setting of acute blood loss. -CRRT off yesterday -Continue midodrine -DC Phenylphrine, has not needed. -Nephrology following for HD needs  COPD without acute exacerbation: Not on home therapy. PRN bornchodilators  OSA: CPAP as needed.  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 Undergoing transplant workup at [facility], however not felt to be a candidate due to obesity at this time.  Had a large BM with blood on 4/11.  Follow Hg.   Chronic normocytic anemia: Baseline Hb 7-8.   Thrombocytopenia: in setting of CLD - Platelets slowly rising  Depression and anxiety: Continue venlafaxine and tramadol  Headache: Continue tramadol and Flexeril.  Itching: Loratadine, hydroxyzine as needed. Have dosed conservatively with concern for sedative effects with renal  failure.  Subjective: Pt without complaints. Had some itching earlier but better now.   Objective: Vitals:   08/14/16 1306 08/14/16 1433 08/14/16 1939 08/15/16 0454  BP:  (!) 116/49 (!) 150/68 (!) 106/50  Pulse:  86 76 78  Resp:  '18 18 19  '$ Temp: 98.1 F (36.7 C) 98.6 F (37 C) 98.2 F (36.8 C) 98.3 F (36.8 C)  TempSrc: Oral Oral Oral Oral  SpO2:  98% 98% 98%  Weight:      Height:       No intake or output data in the 24 hours ending 08/15/16 1045 Filed Weights   08/12/16 0500 08/13/16 0500 08/14/16 0238  Weight: 126.4 kg (278 lb 9.6 oz) 125.2 kg (276 lb 1.6 oz) 123 kg (271 lb 1.6 oz)    Exam:  Constitutional: She is oriented to person, place, and time. No distress.  HENT: Head: Normocephalic and atraumatic.  Eyes: Conjunctivae are normal. No scleral icterus.  Neck: R IJ catheter  Cardiovascular: Normal rate and regular rhythm.   Murmur best appreciated in the left lower sternal border.  Pulmonary/Chest: Effort normal. No respiratory distress.  Abdominal: Soft. She exhibits no distension.  Musculoskeletal: She exhibits edema (1+ pitting edema in the bilateral lower extremities).  Neurological: She is alert and oriented to person, place, and time.  Skin: She is not diaphoretic.   Data Reviewed: Basic Metabolic Panel:  Recent Labs Lab 08/12/16 0407  08/12/16 1600 08/13/16 0438 08/13/16 1541 08/14/16 0402 08/14/16 1954 08/15/16 0430  NA  --   < > 136 136 136 134* 133* 133*  K  --   < > 3.9  3.9 5.1 5.0 4.1 4.1  CL  --   < > 104 102 104 103 104 102  CO2  --   < > '27 26 26 24 24 24  '$ GLUCOSE  --   < > 96 79 98 73 99 67  BUN  --   < > <5* <5* '6 11 17 '$ 21*  CREATININE  --   < > 1.58* 1.72* 2.37* 3.21* 4.30* 4.51*  CALCIUM  --   < > 8.3* 8.3* 8.5* 8.7* 8.7* 8.8*  MG 2.4  --  2.5* 2.4  --  2.6*  --  2.6*  PHOS  --   < > 2.9 2.2* 2.8 3.2 3.6 3.8  < > = values in this interval not displayed. Liver Function Tests:  Recent Labs Lab 08/13/16 0438 08/13/16 1541  08/14/16 0402 08/14/16 1954 08/15/16 0430  ALBUMIN 2.9* 2.9* 2.9* 2.9* 2.9*   No results for input(s): LIPASE, AMYLASE in the last 168 hours. No results for input(s): AMMONIA in the last 168 hours. CBC:  Recent Labs Lab 08/11/16 0430 08/11/16 2000 08/12/16 0407 08/13/16 0438 08/14/16 0402  WBC 4.1 4.2 4.5 4.4 5.5  HGB 6.8* 7.3* 7.6* 7.3* 7.3*  HCT 20.6* 22.0* 22.7* 22.1* 21.7*  MCV 90.7 89.4 89.7 89.8 90.4  PLT 61* 57* 54* 49* 55*   Cardiac Enzymes: No results for input(s): CKTOTAL, CKMB, CKMBINDEX, TROPONINI in the last 168 hours. CBG (last 3)  No results for input(s): GLUCAP in the last 72 hours. Recent Results (from the past 240 hour(s))  MRSA PCR Screening     Status: None   Collection Time: 08/06/16  9:20 AM  Result Value Ref Range Status   MRSA by PCR NEGATIVE NEGATIVE Final    Comment:        The GeneXpert MRSA Assay (FDA approved for NASAL specimens only), is one component of a comprehensive MRSA colonization surveillance program. It is not intended to diagnose MRSA infection nor to guide or monitor treatment for MRSA infections.     Studies: No results found.  Scheduled Meds: . Chlorhexidine Gluconate Cloth  6 each Topical Daily  . darbepoetin (ARANESP) injection - NON-DIALYSIS  200 mcg Subcutaneous Q Fri-1800  . ferumoxytol  510 mg Intravenous Weekly  . Gerhardt's butt cream   Topical TID  . heparin  5,000 Units Subcutaneous Q8H  . lactulose  20 g Oral BID WC  . midodrine  10 mg Oral TID WC  . rifaximin  550 mg Oral BID  . sodium chloride flush  10-40 mL Intracatheter Q12H  . venlafaxine XR  75 mg Oral Daily   Continuous Infusions: . phenylephrine (NEO-SYNEPHRINE) Adult infusion 0 mcg/min (08/10/16 2000)   Active Problems:   Liver cirrhosis secondary to NASH (HCC)   Acute on chronic renal failure (HCC)   ARF (acute renal failure) (HCC)   Colitis   Hypertension   COPD (chronic obstructive pulmonary disease) (HCC)   Nephrolithiasis    Depression   Thrombocytopenia (HCC)   Morbid obesity (HCC)   CKD (chronic kidney disease) stage 4, GFR 15-29 ml/min (HCC)   Hypotension   Acute renal failure (ARF) ([place])  Time spent:   Irwin Brakeman, MD, FAAFP [facility] Pager [phone]  If 7PM-7AM, please contact night-coverage [email or web address] Password [email or web address] 08/15/2016, 10:45 AM    LOS: 13 days

## 2016-08-15 NOTE — Progress Notes (Signed)
Assessment/Plan: 1 Oliguric AKI///CKD 3-4 Presumably, ischemic ATN.Marked vol off. 2 Anemia esa/fe 3 DM 4 Massive obesity 5 Cirrhosis 6 HPTH vit D  P No dialysis today. Check CXR to assist with volume status  Subjective: Interval History: Thinks rectal bleeding  Objective: Vital signs in last 24 hours: Temp:  [98.2 F (36.8 C)-98.3 F (36.8 C)] 98.3 F (36.8 C) (04/11 0454) Pulse Rate:  [76-85] 85 (04/11 1221) Resp:  [18-19] 19 (04/11 0454) BP: (106-150)/(50-68) 124/50 (04/11 1221) SpO2:  [98 %-99 %] 99 % (04/11 1221) Weight change:   Intake/Output from previous day: 04/10 0701 - 04/11 0700 In: 240 [P.O.:240] Out: -  Intake/Output this shift: No intake/output data recorded.  General appearance: alert and cooperative Resp: wheezes and rhonchi bilaterally Chest wall: no tenderness Cardio: regular rate and rhythm, S1, S2 normal, no murmur, click, rub or gallop Extremities: extremities normal, atraumatic, no cyanosis or edema  Lab Results:  Recent Labs  08/13/16 0438 08/14/16 0402  WBC 4.4 5.5  HGB 7.3* 7.3*  HCT 22.1* 21.7*  PLT 49* 55*   BMET:  Recent Labs  08/14/16 1954 08/15/16 0430  NA 133* 133*  K 4.1 4.1  CL 104 102  CO2 24 24  GLUCOSE 99 67  BUN 17 21*  CREATININE 4.30* 4.51*  CALCIUM 8.7* 8.8*   No results for input(s): PTH in the last 72 hours. Iron Studies: No results for input(s): IRON, TIBC, TRANSFERRIN, FERRITIN in the last 72 hours. Studies/Results: No results found.  Scheduled: . Chlorhexidine Gluconate Cloth  6 each Topical Daily  . darbepoetin (ARANESP) injection - NON-DIALYSIS  200 mcg Subcutaneous Q Fri-1800  . ferumoxytol  510 mg Intravenous Weekly  . Gerhardt's butt cream   Topical TID  . heparin  5,000 Units Subcutaneous Q8H  . lactulose  20 g Oral BID WC  . midodrine  10 mg Oral TID WC  . rifaximin  550 mg Oral BID  . sodium chloride flush  10-40 mL Intracatheter Q12H  . venlafaxine XR  75 mg Oral Daily    LOS: 13  days   Xsavier Seeley C 08/15/2016,3:36 PM

## 2016-08-16 ENCOUNTER — Inpatient Hospital Stay (HOSPITAL_COMMUNITY): Payer: Medicare HMO

## 2016-08-16 LAB — COMPREHENSIVE METABOLIC PANEL
ALK PHOS: 106 U/L (ref 38–126)
ALT: 19 U/L (ref 14–54)
ANION GAP: 7 (ref 5–15)
AST: 55 U/L — ABNORMAL HIGH (ref 15–41)
Albumin: 2.8 g/dL — ABNORMAL LOW (ref 3.5–5.0)
BUN: 27 mg/dL — ABNORMAL HIGH (ref 6–20)
CO2: 23 mmol/L (ref 22–32)
Calcium: 8.5 mg/dL — ABNORMAL LOW (ref 8.9–10.3)
Chloride: 102 mmol/L (ref 101–111)
Creatinine, Ser: 5.17 mg/dL — ABNORMAL HIGH (ref 0.44–1.00)
GFR calc non Af Amer: 8 mL/min — ABNORMAL LOW (ref >60)
GFR, EST AFRICAN AMERICAN: 10 mL/min — AB (ref >60)
Glucose, Bld: 73 mg/dL (ref 65–99)
POTASSIUM: 4.1 mmol/L (ref 3.5–5.1)
SODIUM: 132 mmol/L — AB (ref 135–145)
TOTAL PROTEIN: 5 g/dL — AB (ref 6.5–8.1)
Total Bilirubin: 1.8 mg/dL — ABNORMAL HIGH (ref 0.3–1.2)

## 2016-08-16 LAB — CBC
HCT: 20.2 % — ABNORMAL LOW (ref 36.0–46.0)
HCT: 20.2 % — ABNORMAL LOW (ref 36.0–46.0)
Hemoglobin: 6.9 g/dL — CL (ref 12.0–15.0)
Hemoglobin: 6.7 g/dL — CL (ref 12.0–15.0)
MCH: 31.4 pg (ref 26.0–34.0)
MCH: 30.2 pg (ref 26.0–34.0)
MCHC: 34.2 g/dL (ref 30.0–36.0)
MCHC: 33.2 g/dL (ref 30.0–36.0)
MCV: 91.8 fL (ref 78.0–100.0)
MCV: 91 fL (ref 78.0–100.0)
PLATELETS: 60 10*3/uL — AB (ref 150–400)
PLATELETS: 64 10*3/uL — AB (ref 150–400)
RBC: 2.2 MIL/uL — AB (ref 3.87–5.11)
RBC: 2.22 MIL/uL — AB (ref 3.87–5.11)
RDW: 19.7 % — ABNORMAL HIGH (ref 11.5–15.5)
RDW: 19.7 % — AB (ref 11.5–15.5)
WBC: 4.9 10*3/uL (ref 4.0–10.5)
WBC: 5.2 10*3/uL (ref 4.0–10.5)

## 2016-08-16 LAB — PREPARE RBC (CROSSMATCH)

## 2016-08-16 LAB — MAGNESIUM: MAGNESIUM: 2.8 mg/dL — AB (ref 1.7–2.4)

## 2016-08-16 MED ORDER — CAMPHOR-MENTHOL 0.5-0.5 % EX LOTN
TOPICAL_LOTION | CUTANEOUS | Status: DC | PRN
  Administered 2016-08-16: 1 via TOPICAL
  Administered 2016-08-17: 17:00:00 via TOPICAL
  Filled 2016-08-16: qty 222

## 2016-08-16 MED ORDER — MIDODRINE HCL 5 MG PO TABS
ORAL_TABLET | ORAL | Status: AC
  Filled 2016-08-16: qty 2

## 2016-08-16 MED ORDER — SODIUM CHLORIDE 0.9 % IV SOLN: Freq: Once | INTRAVENOUS | Status: DC

## 2016-08-16 MED ORDER — PANTOPRAZOLE SODIUM 40 MG PO TBEC
40.0000 mg | DELAYED_RELEASE_TABLET | Freq: Two times a day (BID) | ORAL | Status: DC
  Administered 2016-08-16 – 2016-08-27 (×23): 40 mg via ORAL
  Filled 2016-08-16 (×23): qty 1

## 2016-08-16 NOTE — Progress Notes (Addendum)
PROGRESS NOTE    Loretta [name]  WGN:[id number]  DOB: 08-31-1955  DOA: 08/02/2016 PCP: Cyndy Freeze, MD Outpatient Specialists:  Hospital course: HPI: Loretta [name] is a 61 y.o. female with medical history significant of liver cirrhosis secondary to NASH, history of edema treated with Lasix. Presenting today after patient was found to have elevated serum creatinine level at her primary care's office. Patient was recently treated for colitis of which she was sent home with Cipro and Flagyl. She completed 12 out of the 14 days of treatment. She still has some abdominal discomfort. Otherwise patient reports no new complaints. Patient denies any fevers.  61 yo MO(273lbs) hx of gastric bypass, non acholic cirrhosis , acute on chronic renal dz, not a candidate for liver tx due to obesity, who is moved to ICU for HD and HD cath placement. PCCM asked to follow in ICU. She is awake and alert and in NAD. RIJ HD catheter was placed and HD to be instituted per renal.  Assessment & Plan:   Acute on chronic kidney disease Stage 3: Acute tubular necrosis vs hepatorenal syndrome in the setting of acute blood loss. -Pt still dialysis dependent, to go to HD 4/12 for another treatment -Continue midodrine -DC Phenylphrine, has not needed. -Nephrology following for HD needs  COPD without acute exacerbation: Not on home therapy. PRN bornchodilators  OSA: CPAP as needed.  Hematochezia - GI consult requested.   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 Undergoing transplant workup at [facility], however not felt to be a candidate due to obesity at this time.  Had a large BM with blood on 4/11.  Follow Hg.   Chronic normocytic anemia: Baseline Hb 7-8.   Fall - Pt complaining of knee pain since fall will get xrays of both knees  Thrombocytopenia: in setting of CLD - Platelets slowly rising  Depression and anxiety: Continue venlafaxine and tramadol  Headache:  Continue tramadol and Flexeril.  Itching: Loratadine, hydroxyzine as needed. Have dosed conservatively with concern for sedative effects with renal failure.  Subjective: Pt fell down on floor now complaining of knee pain   Objective: Vitals:   08/16/16 0853 08/16/16 1300 08/16/16 1305 08/16/16 1330  BP: (!) 136/45 125/62 (!) 127/49 (!) 105/49  Pulse: 81 85 84 88  Resp:  20    Temp:  97.8 F (36.6 C)    TempSrc:  Oral    SpO2:  99%    Weight:      Height:        Intake/Output Summary (Last 24 hours) at 08/16/16 1346 Last data filed at 08/16/16 1000  Gross per 24 hour  Intake             1200 ml  Output                0 ml  Net             1200 ml   Filed Weights   08/13/16 0500 08/14/16 0238 08/16/16 0141  Weight: 125.2 kg (276 lb 1.6 oz) 123 kg (271 lb 1.6 oz) 122.9 kg (271 lb)    Exam:  Constitutional: She is oriented to person, place, and time. No distress.  HENT: Head: Normocephalic and atraumatic.  Eyes: Conjunctivae are normal. No scleral icterus.  Neck: R IJ catheter  Cardiovascular: Normal rate and regular rhythm.   Murmur best appreciated in the left lower sternal border.  Pulmonary/Chest: Effort normal. No respiratory distress.  Abdominal: Soft. She exhibits  no distension.  Musculoskeletal: bilateral knee pain with palpation. She exhibits edema (1+ pitting edema in the bilateral lower extremities).  Neurological: She is alert and oriented to person, place, and time.  Skin: She is not diaphoretic.   Data Reviewed: Basic Metabolic Panel:  Recent Labs Lab 08/12/16 1600 08/13/16 0438 08/13/16 1541 08/14/16 0402 08/14/16 1954 08/15/16 0430 08/15/16 1610 08/16/16 0416  NA 136 136 136 134* 133* 133* 134*  --   K 3.9 3.9 5.1 5.0 4.1 4.1 4.1  --   CL 104 102 104 103 104 102 101  --   CO2 '27 26 26 24 24 24 22  '$ --   GLUCOSE 96 79 98 73 99 67 88  --   BUN <5* <5* '6 11 17 '$ 21* 24*  --   CREATININE 1.58* 1.72* 2.37* 3.21* 4.30* 4.51* 4.90*  --   CALCIUM  8.3* 8.3* 8.5* 8.7* 8.7* 8.8* 9.1  --   MG 2.5* 2.4  --  2.6*  --  2.6*  --  2.8*  PHOS 2.9 2.2* 2.8 3.2 3.6 3.8 3.7  --    Liver Function Tests:  Recent Labs Lab 08/13/16 1541 08/14/16 0402 08/14/16 1954 08/15/16 0430 08/15/16 1610  ALBUMIN 2.9* 2.9* 2.9* 2.9* 3.0*   No results for input(s): LIPASE, AMYLASE in the last 168 hours. No results for input(s): AMMONIA in the last 168 hours. CBC:  Recent Labs Lab 08/13/16 0438 08/14/16 0402 08/15/16 1630 08/16/16 0416 08/16/16 1254  WBC 4.4 5.5 5.4 4.9 5.2  HGB 7.3* 7.3* 7.1* 6.9* 6.7*  HCT 22.1* 21.7* 21.4* 20.2* 20.2*  MCV 89.8 90.4 91.8 91.8 91.0  PLT 49* 55* 68* 60* 64*   Cardiac Enzymes: No results for input(s): CKTOTAL, CKMB, CKMBINDEX, TROPONINI in the last 168 hours. CBG (last 3)  No results for input(s): GLUCAP in the last 72 hours. No results found for this or any previous visit (from the past 240 hour(s)).  Studies: Dg Knee 1-2 Views Left  Result Date: 08/16/2016 CLINICAL DATA:  Recent fall, bilateral knee pain left greater than right EXAM: LEFT KNEE - 1-2 VIEW COMPARISON:  None. FINDINGS: Two views of the left knee submitted. No acute fracture or subluxation. Mild narrowing of medial joint compartment. No joint effusion. IMPRESSION: No acute fracture or subluxation. Mild narrowing of medial joint compartment. Electronically Signed   By: Lahoma Crocker M.D.   On: 08/16/2016 12:10   Dg Knee 1-2 Views Right  Result Date: 08/16/2016 CLINICAL DATA:  Recent fall. Bilateral knee discomfort. No swelling or bruising. EXAM: RIGHT KNEE - 1-2 VIEW COMPARISON:  None. FINDINGS: There is no joint effusion. No fracture or subluxation identified. Sharpening of the tibial spines identified. There is mild medial and patellofemoral compartment narrowing. IMPRESSION: 1. No acute findings. 2. Osteoarthritis. Electronically Signed   By: Kerby Moors M.D.   On: 08/16/2016 12:19   Dg Chest Port 1 View  Result Date: 08/15/2016 CLINICAL  DATA:  61 y/o  F; congestive heart failure. EXAM: PORTABLE CHEST 1 VIEW COMPARISON:  08/06/2016 chest radiograph FINDINGS: Stable cardiac silhouette. Right central venous catheter tip projects over upper SVC. Interstitial lung markings with improved aeration of lung bases. No airspace consolidation. No pleural effusion or pneumothorax. No acute osseous abnormality is evident. IMPRESSION: Interstitial lung markings probably representing interstitial pulmonary edema. Improved aeration of lung bases. Electronically Signed   By: Kristine Garbe M.D.   On: 08/15/2016 18:26    Scheduled Meds: . midodrine      .  sodium chloride   Intravenous Once  . sodium chloride   Intravenous Once  . Chlorhexidine Gluconate Cloth  6 each Topical Daily  . darbepoetin (ARANESP) injection - NON-DIALYSIS  200 mcg Subcutaneous Q Fri-1800  . ferumoxytol  510 mg Intravenous Weekly  . furosemide  20 mg Intravenous Once  . Gerhardt's butt cream   Topical TID  . heparin  5,000 Units Subcutaneous Q8H  . lactulose  20 g Oral BID WC  . midodrine  10 mg Oral TID WC  . pantoprazole  40 mg Oral BID  . rifaximin  550 mg Oral BID  . sodium chloride flush  10-40 mL Intracatheter Q12H  . venlafaxine XR  75 mg Oral Daily   Continuous Infusions: . phenylephrine (NEO-SYNEPHRINE) Adult infusion 0 mcg/min (08/10/16 2000)   Active Problems:   Liver cirrhosis secondary to NASH (HCC)   Acute on chronic renal failure (HCC)   ARF (acute renal failure) (HCC)   Colitis   Hypertension   COPD (chronic obstructive pulmonary disease) (HCC)   Nephrolithiasis   Depression   Thrombocytopenia (HCC)   Morbid obesity (HCC)   CKD (chronic kidney disease) stage 4, GFR 15-29 ml/min (HCC)   Hypotension   Acute renal failure (ARF) ([place])  Time spent:   Irwin Brakeman, MD, FAAFP [facility] Pager [phone]  If 7PM-7AM, please contact night-coverage [email or web address] Password [email or web address] 08/16/2016, 1:46 PM    LOS: 14 days

## 2016-08-16 NOTE — Progress Notes (Signed)
This Rn was called and was informed that patient is on the floor. Found patient on the floor, husband at bedside.. Apparently the husband was helping her to sit in the sofa but her knees gave out, per patient she just went ahead and kneel and lay on the floor. Patient  Alert and oriented, complained of Bil knee pain post fall, v/s checked ( see flowsheet). Assisted back on the bed using [employer].Bed alarm turned on.AD, ambulatory tech in to help. MD made aware,seen patient, made orders.

## 2016-08-16 NOTE — Progress Notes (Signed)
Assessment/Plan: 1 Oliguric AKI///CKD 3-4 Presumably, ischemic ATN.Mild pulm congestion 2 Anemia esa/fe PRBCs on HD 3 DM 4 Massive obesity 5 Cirrhosis 6 HPTH vit D  P Dialysis today.   Subjective: Interval History:  Remains dialysis dependent  Objective: Vital signs in last 24 hours: Temp:  [97.7 F (36.5 C)-98.6 F (37 C)] 98.6 F (37 C) (04/12 0439) Pulse Rate:  [80-84] 81 (04/12 0853) Resp:  [18] 18 (04/12 0439) BP: (115-136)/(45-66) 136/45 (04/12 0853) SpO2:  [96 %-99 %] 96 % (04/12 0439) Weight:  [122.9 kg (271 lb)] 122.9 kg (271 lb) (04/12 0141) Weight change:   Intake/Output from previous day: 04/11 0701 - 04/12 0700 In: 1440 [P.O.:1440] Out: 0  Intake/Output this shift: Total I/O In: 360 [P.O.:360] Out: -   General appearance: alert and cooperative Chest wall: no tenderness Cardio: regular rate and rhythm, S1, S2 normal, no murmur, click, rub or gallop GI: large with ascites Extremities: edema tr  Lab Results:  Recent Labs  08/15/16 1630 08/16/16 0416  WBC 5.4 4.9  HGB 7.1* 6.9*  HCT 21.4* 20.2*  PLT 68* 60*   BMET:  Recent Labs  08/15/16 0430 08/15/16 1610  NA 133* 134*  K 4.1 4.1  CL 102 101  CO2 24 22  GLUCOSE 67 88  BUN 21* 24*  CREATININE 4.51* 4.90*  CALCIUM 8.8* 9.1   No results for input(s): PTH in the last 72 hours. Iron Studies: No results for input(s): IRON, TIBC, TRANSFERRIN, FERRITIN in the last 72 hours. Studies/Results: Dg Knee 1-2 Views Left  Result Date: 08/16/2016 CLINICAL DATA:  Recent fall, bilateral knee pain left greater than right EXAM: LEFT KNEE - 1-2 VIEW COMPARISON:  None. FINDINGS: Two views of the left knee submitted. No acute fracture or subluxation. Mild narrowing of medial joint compartment. No joint effusion. IMPRESSION: No acute fracture or subluxation. Mild narrowing of medial joint compartment. Electronically Signed   By: Lahoma Crocker M.D.   On: 08/16/2016 12:10   Dg Knee 1-2 Views Right  Result  Date: 08/16/2016 CLINICAL DATA:  Recent fall. Bilateral knee discomfort. No swelling or bruising. EXAM: RIGHT KNEE - 1-2 VIEW COMPARISON:  None. FINDINGS: There is no joint effusion. No fracture or subluxation identified. Sharpening of the tibial spines identified. There is mild medial and patellofemoral compartment narrowing. IMPRESSION: 1. No acute findings. 2. Osteoarthritis. Electronically Signed   By: Kerby Moors M.D.   On: 08/16/2016 12:19   Dg Chest Port 1 View  Result Date: 08/15/2016 CLINICAL DATA:  61 y/o  F; congestive heart failure. EXAM: PORTABLE CHEST 1 VIEW COMPARISON:  08/06/2016 chest radiograph FINDINGS: Stable cardiac silhouette. Right central venous catheter tip projects over upper SVC. Interstitial lung markings with improved aeration of lung bases. No airspace consolidation. No pleural effusion or pneumothorax. No acute osseous abnormality is evident. IMPRESSION: Interstitial lung markings probably representing interstitial pulmonary edema. Improved aeration of lung bases. Electronically Signed   By: Kristine Garbe M.D.   On: 08/15/2016 18:26   Scheduled: . sodium chloride   Intravenous Once  . sodium chloride   Intravenous Once  . Chlorhexidine Gluconate Cloth  6 each Topical Daily  . darbepoetin (ARANESP) injection - NON-DIALYSIS  200 mcg Subcutaneous Q Fri-1800  . ferumoxytol  510 mg Intravenous Weekly  . furosemide  20 mg Intravenous Once  . Gerhardt's butt cream   Topical TID  . heparin  5,000 Units Subcutaneous Q8H  . lactulose  20 g Oral BID WC  . midodrine  10 mg Oral TID WC  . pantoprazole  40 mg Oral BID  . rifaximin  550 mg Oral BID  . sodium chloride flush  10-40 mL Intracatheter Q12H  . venlafaxine XR  75 mg Oral Daily     LOS: 14 days   Lannis Lichtenwalner C 08/16/2016,12:28 PM

## 2016-08-16 NOTE — Consult Note (Signed)
[facility] Consult: 11:42 AM 08/16/2016  LOS: 14 days    Referring Provider: Dr Wynetta Emery of [facility]  Primary Care Physician:  Cyndy Freeze, MD in [facility] Primary Gastroenterologist:  At [facility].  DR Allen Kell.  Dr Lyndel Safe in [place].    Reason for Consultation:  Rectal bleeding   HPI: Loretta [name] is a 61 y.o. female.  PMH CKD stage 4.  DM type 2.  COPD.  Morbid Obesity.  s/p bariatric Roux-en-Y procedure 2009.   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   Left heart cardiac cath 12/2014 at [name], LVEDP 61m mercury. Blood transfusions 10/2011 and 01/2016.  Thrombocytopenia LE edema.  OSA.  HTN.  Anxiety.  Diverticulosis.  Colon polyps.   Lap chole 2005.  Hepatitis A and B immune by antibody testing 2012, 2013. 2009 EGD with erosive gastropathy.  [facility]  Only able to find pathology report. Pyloric channel biopsy showed inflammation of gastric and duodenal mucosa with reactive epithelial changes. H pylori not identified. [facility]  No polyps. Hemorrhoids noticed. 01/2016 EGD by Dr MWatt Climesas inpt, for melena and anemia. Nonbleeding gastric ulcer without stigmata of bleeding. Post gastric bypass anatomy with non-bleeding ulceration at the gastrojejunal anastomosis.  12/2015 MRI abdomen.  Cirrhosis. Portal hypertension.  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   Recannalized umbilical vein. 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 Stable, diffuse narrowing/attenuation of Main right and left portal veins. Left hepatic vein not well visualized. 07/02/16 liver ultrasound: Nondiagnostic study due to  patient body habitus.  Gall bladder surgically absent, liver and pancreas not well visualized, CBD and intrahepatic ducts not seen.  Screening MRI or CT recommended  Patient has had been having worsening fluid retention and swelling of all her limbs and abdomen over the course of at least 6 weeks. She has chronic fatigue. [place] scan raised the question of colitis as well as a "pocket of fluid in her abdomen ". She received IV antibiotics and was sent home with a prescription for oral antibiotics. Her swelling continued, she wasn't having any more than her usual frequency of stools. She became weaker and then had a large episode of hematochezia at home and [facility] improvement. [place] [facility] [place]. She spent time in the ICU due to needs for CRRT to address massive volume overload confounded by oliguric acute renal failure.  There was question of hepato-renal syndrome versus acute tubular necrosis, over time the ATN diagnosis was favored.  CRRT stopped on 08/13/16. Weight was down 22 kg.  since discontinuation of CRRT she has not required hemodialysis.  She was transferred to the floor and [facility] hospitalist assumed her care  on 4/11.  In the last couple of days she's had recurrent hematochezia. It looks like red blood in the commode. This is similar to what was observe  in September when she had a GI bleed, required transfusion and was endoscoped by Dr. Watt Climes. Cipro and Flagyl were continued for this question of colitis through 4/1. Hemoglobin is 6.9 today but it was 6.8 five days ago.  She received a unit of blood on 4/7 and a unit of blood on 3/31. 2 more units of blood are ordered to transfuse today. Subcutaneous heparin injections continue. Pt has history of minor rectal  bleeding from her hemorrhoids but the bleeding that she had prior to going to [facility] 2 weeks ago and the amount that has been observed here at the hospital is above her normal bleeding from hemorrhoids.  No coags performed this admission.   Pt has never undergone a paracentesis.  She is anorexic, as is her norm, but she is tolerating solid food.    Past Medical History:  Diagnosis Date  . CKD (chronic kidney disease) stage 4, GFR 15-29 ml/min (HCC) 01/2016  . COPD (chronic obstructive pulmonary disease) ([place])   . Depression   . Hypertension   . Liver cirrhosis secondary to NASH ([place])   . Morbid obesity ([place])   . Nephrolithiasis   . Thrombocytopenia ([place])     Past Surgical History:  Procedure Laterality Date  . CHOLECYSTECTOMY    . ESOPHAGOGASTRODUODENOSCOPY N/A 01/16/2016   Procedure: ESOPHAGOGASTRODUODENOSCOPY (EGD);  Surgeon: Clarene Essex, MD;  Location[facility] END[facility]COPY;  Service: Endoscopy;  Laterality: N/A;  . GASTRIC BYPASS      Prior to Admission medications   Medication Sig Start Date End Date Taking? Authorizing Provider  docusate sodium (COLACE) 100 MG capsule Take 100 mg by mouth daily as needed for moderate constipation.    Yes Historical Provider, MD  folic acid (FOLVITE) 1 MG tablet Take 1 mg by mouth daily.   Yes Historical Provider, MD  furosemide (LASIX) 20 MG tablet Take 1 tablet (20 mg total) by mouth 2 (two) times daily. Restart in 5-6days after FU with PCP 01/21/16  Yes Domenic Polite, MD  KRISTALOSE 20 g packet Take 20 g by mouth 3 (three) times daily.  11/14/15  Yes Historical Provider, MD  Melatonin 3 MG TABS Take 3 mg by mouth at bedtime.   Yes Historical Provider, MD  metolazone (ZAROXOLYN) 5 MG tablet Take 5 mg by mouth 3 (three) times a week. 07/23/16  Yes Historical Provider, MD  milk thistle 175 MG tablet Take 175 mg by mouth 2 (two) times daily.   Yes Historical Provider, MD  Multiple Vitamin (MULTI-VITAMIN PO) Take 1 tablet by mouth daily.   Yes  Historical Provider, MD  nadolol (CORGARD) 40 MG tablet Take 40 mg by mouth 2 (two) times daily.  12/23/15  Yes Historical Provider, MD  pantoprazole (PROTONIX) 40 MG tablet Take 1 tablet (40 mg total) by mouth 2 (two) times daily before a meal. 01/21/16  Yes Domenic Polite, MD  pramipexole (MIRAPEX) 0.25 MG tablet Take 0.25 mg by mouth 2 (two) times daily. 11/18/15  Yes Historical Provider, MD  Probiotic Product (PROBIOTIC PO) Take 1 tablet by mouth daily.   Yes Historical Provider, MD  spironolactone (ALDACTONE) 50 MG tablet Take 1 tablet (50 mg total) by mouth daily. Restart in 5-6days after FU with PCP Patient taking differently: Take 25 mg by mouth daily. Restart in 5-6days after FU with PCP 01/21/16  Yes  Domenic Polite, MD  venlafaxine XR (EFFEXOR-XR) 75 MG 24 hr capsule Take 75 mg by mouth daily with breakfast.   Yes Historical Provider, MD  VENTOLIN HFA 108 (90 Base) MCG/ACT inhaler Inhale 2 puffs into the lungs every 4 (four) hours as needed for wheezing or shortness of breath.  07/25/16  Yes Historical Provider, MD  XIFAXAN 550 MG TABS tablet Take 550 mg by mouth 2 (two) times daily. 12/23/15  Yes Historical Provider, MD    Scheduled Meds: . sodium chloride   Intravenous Once  . sodium chloride   Intravenous Once  . Chlorhexidine Gluconate Cloth  6 each Topical Daily  . darbepoetin (ARANESP) injection - NON-DIALYSIS  200 mcg Subcutaneous Q Fri-1800  . ferumoxytol  510 mg Intravenous Weekly  . furosemide  20 mg Intravenous Once  . Gerhardt's butt cream   Topical TID  . heparin  5,000 Units Subcutaneous Q8H  . lactulose  20 g Oral BID WC  . midodrine  10 mg Oral TID WC  . pantoprazole  40 mg Oral BID  . rifaximin  550 mg Oral BID  . sodium chloride flush  10-40 mL Intracatheter Q12H  . venlafaxine XR  75 mg Oral Daily   Infusions: . phenylephrine (NEO-SYNEPHRINE) Adult infusion 0 mcg/min (08/10/16 2000)   PRN Meds: acetaminophen, albuterol, ALPRAZolam, camphor-menthol,  cyclobenzaprine, heparin, hydrOXYzine, loratadine, ondansetron (ZOFRAN) IV, sodium chloride, sodium chloride flush, traMADol   Allergies as of 08/02/2016 - Review Complete 08/02/2016  Allergen Reaction Noted  . Codeine  01/16/2016  . Cymbalta [duloxetine hcl]  01/16/2016  . Levaquin [levofloxacin in d5w]  01/16/2016  . Tomato Itching 01/16/2016    Family History  Problem Relation Age of Onset  . Throat cancer Father   . Lung cancer Brother     Social History   Social History  . Marital status: Married    Spouse name: N/A  . Number of children: N/A  . Years of education: N/A   Occupational History  . Not on file.   Social History Main Topics  . Smoking status: Former Research scientist (life sciences)  . Smokeless tobacco: Never Used  . Alcohol use No  . Drug use: No  . Sexual activity: Not on file   Other Topics Concern  . Not on file   Social History Narrative  . No narrative on file    REVIEW OF SYSTEMS: Constitutional:  Weakness and fatigue persist but are much improved compared with 2 weeks ago. ENT:  No nose bleeds Pulm:  Not dyspneic at rest. No cough. CV:  No palpitations, no LE edema.  GU:  No hematuria, no frequency GI:  No dysphagia.see HPI Heme:  Bruises easily. Bleeding from GI tract as per HPI   Transfusions:  Per HPI Neuro:  No headaches, no peripheral tingling or numbness Derm:  No itching, no rash or sores.  Endocrine:  No sweats or chills.  No polyuria or dysuria Immunization:  Not queried Travel:  None beyond local counties in last few months.    PHYSICAL EXAM: Vital signs in last 24 hours: Vitals:   08/16/16 0439 08/16/16 0853  BP: (!) 115/49 (!) 136/45  Pulse: 84 81  Resp: 18   Temp: 98.6 F (37 C)    Wt Readings from Last 3 Encounters:  08/16/16 122.9 kg (271 lb)  01/18/16 124 kg (273 lb 5.9 oz)    General: Very pleasant, morbidly obese WF Head:  Obese face. Faces symmetric. No signs of head trauma.  Eyes:  No  scleral icterus or conjunctival  pallor. Ears:  Not hard of hearing  Nose:  Or discharge or congestion Mouth:  Pink, clear, moist oral mucosa. Tongue midline. Neck:  No JVD, no thyromegaly, no masses. Lungs:  Clear bilaterally no labored breathing or cough. Heart: RRR. No MRG. S1, S2 present. Abdomen:  Obese with impressive pannus. There is some redness to the sides of her abdominal skin which is tender to the touch. Exam of the viscera and organs is not tender.  Prominent striae in the pannus, these are peripheral and not this collard so I don't think these are At [place].. Unable to appreciate masses or organomegaly..   Rectal: Small to medium sized hemorrhoidal tags no red blood. Stool is brown, FOBT negative   Musc/Skeltl: No joint erythema or gross deformity. Extremities:  Edema in the lower extremities, upper extremities and generalized anasarca.  Neurologic:  Alert. Oriented times 3. No asterixis. Moves all 4 limbs. Able to turn herself in the bed without assistance. Skin:  No telangiectasias. Purpura on both forearms Tattoos:  None Nodes:  No cervical adenopathy.   Psych:  , Pleasant, in good humor.  Intake/Output from previous day: 04/11 0701 - 04/12 0700 In: 1440 [P.O.:1440] Out: 0  Intake/Output this shift: Total I/O In: 360 [P.O.:360] Out: -   LAB RESULTS:  Recent Labs  08/14/16 0402 08/15/16 1630 08/16/16 0416  WBC 5.5 5.4 4.9  HGB 7.3* 7.1* 6.9*  HCT 21.7* 21.4* 20.2*  PLT 55* 68* 60*   BMET Lab Results  Component Value Date   NA 134 (L) 08/15/2016   NA 133 (L) 08/15/2016   NA 133 (L) 08/14/2016   K 4.1 08/15/2016   K 4.1 08/15/2016   K 4.1 08/14/2016   CL 101 08/15/2016   CL 102 08/15/2016   CL 104 08/14/2016   CO2 22 08/15/2016   CO2 24 08/15/2016   CO2 24 08/14/2016   GLUCOSE 88 08/15/2016   GLUCOSE 67 08/15/2016   GLUCOSE 99 08/14/2016   BUN 24 (H) 08/15/2016   BUN 21 (H) 08/15/2016   BUN 17 08/14/2016   CREATININE 4.90 (H) 08/15/2016   CREATININE 4.51 (H) 08/15/2016    CREATININE 4.30 (H) 08/14/2016   CALCIUM 9.1 08/15/2016   CALCIUM 8.8 (L) 08/15/2016   CALCIUM 8.7 (L) 08/14/2016   LFT  Recent Labs  08/14/16 1954 08/15/16 0430 08/15/16 1610  ALBUMIN 2.9* 2.9* 3.0*   PT/INR Lab Results  Component Value Date   INR 1.45 01/20/2016   INR 1.74 01/16/2016   INR 2.02 01/16/2016   Hepatitis Panel No results for input(s): HEPBSAG, HCVAB, HEPAIGM, HEPBIGM in the last 72 hours. C-Diff No components found for: CDIFF Lipase  No results found for: LIPASE  Drugs of Abuse  No results found for: LABOPIA, COCAINSCRNUR, LABBENZ, AMPHETMU, THCU, LABBARB   RADIOLOGY STUDIES: Dg Chest Port 1 View  Result Date: 08/15/2016 CLINICAL DATA:  61 y/o  F; congestive heart failure. EXAM: PORTABLE CHEST 1 VIEW COMPARISON:  08/06/2016 chest radiograph FINDINGS: Stable cardiac silhouette. Right central venous catheter tip projects over upper SVC. Interstitial lung markings with improved aeration of lung bases. No airspace consolidation. No pleural effusion or pneumothorax. No acute osseous abnormality is evident. IMPRESSION: Interstitial lung markings probably representing interstitial pulmonary edema. Improved aeration of lung bases. Electronically Signed   By: Kristine Garbe M.D.   On: 08/15/2016 18:26     IMPRESSION:   *  Painless hematochezia.  Wonder if this is a diverticular bleed. We  think the last colonoscopy was at [facility] in 2013 when she did not have any recurrent colon polyps. EGD 01/2016 revealed nonbleeding gastric ulcer and nonbleeding ulcerations at the gastrojejunal anastomosis.  Interestingly that endoscopy did not confirm presence of any varices or portal hypertension that had been seen on the MRI in 12/2015.  *  Anemia, blood loss media as well as anemia of chronic disease.Marland Kitchen s.p PRBC x 2 since admission.  2 more ordered today. Renal initiated Feraheme and Aranesp has been initiated.  *  NASH cirrhosis.  Not able to calculate a current meld as  she hasn't had INR drawn during this admission.   *  AKI complicated by massive volume overload.  Has diuresed well with CRRT. Renal is following patient to see if she will require future hemodialysis sessions.  *  Morbid obesity. Status post 2009 Roux-en-Y gastric bypass  *  History hepatic encephalopathy. She is not currently encephalopathic and continues on her usual lactulose and rifaximin.   PLAN:     *  INR and CMET tomorrow Which will allow [place] to calculate her current meld.    *  Dr. Ardis Hughs will be seeing the patient presents afternoon. She is obviously high risk for colonoscopy should he decide to pursue this.   Azucena Freed  08/16/2016, 11:42 AM Pager: [phone]  ________________________________________________________________________  Velora Heckler GI MD note:  I personally examined the patient, reviewed the data and agree with the assessment and plan described above.  Hematochezia. She is a very poor candidate for colonoscopy currently given morbid obesity, ARF, volume overload.  Hb 6.7 but it was 7.7 when she was admitted 2 weeks ago so I'm not sure if she's really had a significant amount of bleeding in past couple days.  Need INR checked, she has cirrhosis.  Will follow along, currently no plans for invasive testing.   Owens Loffler, MD [facility] Pager [phone]

## 2016-08-16 NOTE — Progress Notes (Signed)
Patient states that she wants to try something else for her itching. Benadryl and vistaril did not work for her. NP has been notified. Awaiting any further orders.

## 2016-08-16 NOTE — Progress Notes (Signed)
[name] MRN: [id number] DOB: [birth year]   Cancelled Treatment:    Reason Eval/Treat Not Completed: Patient at procedure or test/unavailable (dialysis) Will follow up tomorrow.   Di Jasmer B. Migdalia Dk PT, DPT Acute Rehabilitation  [phone] Pager [phone]   [place] 08/16/2016, 2:32 PM

## 2016-08-16 NOTE — Progress Notes (Signed)
Back from HD, patient alert and oriented,no complaints of any pain or discomfort.

## 2016-08-17 DIAGNOSIS — I509 Heart failure, unspecified: Secondary | ICD-10-CM

## 2016-08-17 DIAGNOSIS — J449 Chronic obstructive pulmonary disease, unspecified: Secondary | ICD-10-CM

## 2016-08-17 LAB — TYPE AND SCREEN
ABO/RH(D): AB POS
Antibody Screen: NEGATIVE
Unit division: 0
Unit division: 0

## 2016-08-17 LAB — RENAL FUNCTION PANEL
ANION GAP: 7 (ref 5–15)
Albumin: 2.9 g/dL — ABNORMAL LOW (ref 3.5–5.0)
BUN: 14 mg/dL (ref 6–20)
CALCIUM: 8.4 mg/dL — AB (ref 8.9–10.3)
CO2: 27 mmol/L (ref 22–32)
Chloride: 98 mmol/L — ABNORMAL LOW (ref 101–111)
Creatinine, Ser: 3.75 mg/dL — ABNORMAL HIGH (ref 0.44–1.00)
GFR calc non Af Amer: 12 mL/min — ABNORMAL LOW (ref >60)
GFR, EST AFRICAN AMERICAN: 14 mL/min — AB (ref >60)
Glucose, Bld: 73 mg/dL (ref 65–99)
Phosphorus: 3.7 mg/dL (ref 2.5–4.6)
Potassium: 3.6 mmol/L (ref 3.5–5.1)
SODIUM: 132 mmol/L — AB (ref 135–145)

## 2016-08-17 LAB — CBC
HEMATOCRIT: 25.4 % — AB (ref 36.0–46.0)
Hemoglobin: 8.8 g/dL — ABNORMAL LOW (ref 12.0–15.0)
MCH: 30.8 pg (ref 26.0–34.0)
MCHC: 34.6 g/dL (ref 30.0–36.0)
MCV: 88.8 fL (ref 78.0–100.0)
Platelets: 43 10*3/uL — ABNORMAL LOW (ref 150–400)
RBC: 2.86 MIL/uL — ABNORMAL LOW (ref 3.87–5.11)
RDW: 19.9 % — AB (ref 11.5–15.5)
WBC: 5.5 10*3/uL (ref 4.0–10.5)

## 2016-08-17 LAB — PROTIME-INR
INR: 1.51
Prothrombin Time: 18.4 seconds — ABNORMAL HIGH (ref 11.4–15.2)

## 2016-08-17 LAB — BPAM RBC
Blood Product Expiration Date: 201804282359
Blood Product Expiration Date: 201804292359
ISSUE DATE / TIME: 201804121449
ISSUE DATE / TIME: 201804121449
UNIT TYPE AND RH: 8400
Unit Type and Rh: 8400

## 2016-08-17 LAB — HEPATITIS B CORE ANTIBODY, TOTAL: Hep B Core Total Ab: NEGATIVE

## 2016-08-17 LAB — HEPATITIS B E ANTIGEN: Hep B E Ag: NEGATIVE

## 2016-08-17 LAB — HEPATITIS B SURFACE ANTIGEN: HEP B S AG: NEGATIVE

## 2016-08-17 LAB — MAGNESIUM: Magnesium: 2.2 mg/dL (ref 1.7–2.4)

## 2016-08-17 MED ORDER — PHYTONADIONE 5 MG PO TABS
5.0000 mg | ORAL_TABLET | Freq: Every day | ORAL | Status: AC
Start: 2016-08-17 — End: 2016-08-19
  Administered 2016-08-17 – 2016-08-19 (×3): 5 mg via ORAL
  Filled 2016-08-17 (×3): qty 1

## 2016-08-17 NOTE — Progress Notes (Addendum)
Physical Therapy Treatment  Name: Loretta [name] MRN: [id number] DOB: 1955/05/15 Today's Date: 08/17/2016    History of Present Illness Pt adm with acute renal failure and CVVHD initiated on 4/1. CVVHD stopped 4/9. PMH - NASH cirrhosis, morbid obesity, gastric bypass    PT Comments    Pt is making good progress towards her goals and time to reach her goals has been adjusted to accomodate her length of stay. Pt is min guard for transfers with RW and ambulation of 250 feet with RW. Pt requires continued skilled PT to progress ambulation and to improve LE strength and endurance to safely navigate her home at discharge.     Follow Up Recommendations  Home health PT     Equipment Recommendations  None recommended by PT       Precautions / Restrictions Precautions Precautions: Fall Restrictions Weight Bearing Restrictions: No    Mobility  Bed Mobility               General bed mobility comments: on toilet on entry  Transfers Overall transfer level: Needs assistance Equipment used: Rolling walker (2 wheeled) Transfers: Sit to/from Stand Sit to Stand: Min guard         General transfer comment: assist for safety up from toilet  Ambulation/Gait Ambulation/Gait assistance: Min guard (chair follow ) Ambulation Distance (Feet): 250 Feet Assistive device: Rolling walker (2 wheeled) Gait Pattern/deviations: Step-through pattern;Decreased step length - right;Decreased step length - left;Trunk flexed Gait velocity: slow Gait velocity interpretation: Below normal speed for age/gender General Gait Details: progressed to 250 feet however very fatigued on return to room     Balance Overall balance assessment: Needs assistance Sitting-balance support: No upper extremity supported;Feet supported Sitting balance-Leahy Scale: Good     Standing balance support: Single extremity supported Standing balance-Leahy Scale: Fair Standing balance comment: stood for pericare with UE  support on RW                            Cognition Arousal/Alertness: Awake/alert Behavior During Therapy: WFL for tasks assessed/performed Overall Cognitive Status: Within Functional Limits for tasks assessed                                           General Comments General comments (skin integrity, edema, etc.): Husband present during treatment      Pertinent Vitals/Pain Pain Assessment: 0-10 Pain Score: 2  Pain Location: abdomen Pain Descriptors / Indicators: Aching;Sore Pain Intervention(s): Limited activity within patient's tolerance;Monitored during session  VSS           PT Goals (current goals can now be found in the care plan section) Acute Rehab PT Goals Patient Stated Goal: go home PT Goal Formulation: With patient Time For Goal Achievement: 08/22/16 Potential to Achieve Goals: Good Progress towards PT goals: Progressing toward goals    Frequency    Min 3X/week      PT Plan Current plan remains appropriate       End of Session Equipment Utilized During Treatment: Gait belt Activity Tolerance: Patient limited by fatigue Patient left: with call bell/phone within reach;in chair;with family/visitor present Nurse Communication: Mobility status PT Visit Diagnosis: Muscle weakness (generalized) (M62.81);Difficulty in walking, not elsewhere classified (R26.2)     Time: 2458-0998 PT Time Calculation (min) (ACUTE ONLY): 16 min  Charges:  $Gait Training: 8-22 mins  G Codes:       Tagan Bartram B. Migdalia Dk PT, DPT Acute Rehabilitation  [phone] Pager [phone]     [place] 08/17/2016, 1:47 PM

## 2016-08-17 NOTE — Care Management Important Message (Signed)
Important Message  Patient Details  Name: Loretta [name] MRN: [id number] Date of Birth: 05/07/1956   Medicare Important Message Given:  Yes    [name] 08/17/2016, 2:53 PM

## 2016-08-17 NOTE — Care Management Note (Signed)
Case Management Note  Patient Details  Name: Loretta [name] MRN: [id number] Date of Birth: January 04, 1956  Subjective/Objective:                    Action/Plan:  From Home w/ Husband- want [facility] .  PT recommending HHPT will need orders and face to face  Expected Discharge Date:                  Expected Discharge Plan:  [place]  In-House Referral:  NA  Discharge planning Services  CM Consult  Post Acute Care Choice:  Home Health Choice offered to:  Spouse  DME Arranged:   (Has RW and Cane at home) DME Agency:     HH Arranged:    [place] Agency:     Status of Service:  In process, will continue to follow  If discussed at Long Length of Stay Meetings, dates discussed:    Additional Comments:  Marilu Favre, RN 08/17/2016, 10:13 AM

## 2016-08-17 NOTE — Progress Notes (Signed)
[facility] Progress Note    Since last GI note:  received 2 units blood yesterday with Hb bump 6.7 to 8.8.  Just had a small amount of bleeding with BM; pink water in toilet, mixed with urine and stool.  Not significant amount of bleeding.    Objective: Vital signs in last 24 hours: Temp:  [97.8 F (36.6 C)-98.2 F (36.8 C)] 98 F (36.7 C) (04/13 0450) Pulse Rate:  [78-96] 83 (04/13 0450) Resp:  [16-20] 20 (04/13 0450) BP: (105-136)/(41-62) 117/62 (04/13 0450) SpO2:  [94 %-99 %] 94 % (04/13 0450) Weight:  [266 lb 12.1 oz (121 kg)-271 lb 2.7 oz (123 kg)] 266 lb 12.1 oz (121 kg) (04/12 1630) Last BM Date: 08/16/16 General: alert and oriented times 3, massively obese Heart: regular rate and rythm Abdomen: soft, non-tender, non-distended, normal bowel sounds   Lab Results:  Recent Labs  08/16/16 0416 08/16/16 1254 08/17/16 0522  WBC 4.9 5.2 5.5  HGB 6.9* 6.7* 8.8*  PLT 60* 64* 43*  MCV 91.8 91.0 88.8    Recent Labs  08/15/16 1610 08/16/16 1254 08/17/16 0522  NA 134* 132* 132*  K 4.1 4.1 3.6  CL 101 102 98*  CO2 '22 23 27  '$ GLUCOSE 88 73 73  BUN 24* 27* 14  CREATININE 4.90* 5.17* 3.75*  CALCIUM 9.1 8.5* 8.4*    Recent Labs  08/15/16 1610 08/16/16 1254 08/17/16 0522  PROT  --  5.0*  --   ALBUMIN 3.0* 2.8* 2.9*  AST  --  55*  --   ALT  --  19  --   ALKPHOS  --  106  --   BILITOT  --  1.8*  --     Recent Labs  08/17/16 0522  INR 1.51      Medications: Scheduled Meds: . sodium chloride   Intravenous Once  . sodium chloride   Intravenous Once  . Chlorhexidine Gluconate Cloth  6 each Topical Daily  . darbepoetin (ARANESP) injection - NON-DIALYSIS  200 mcg Subcutaneous Q Fri-1800  . ferumoxytol  510 mg Intravenous Weekly  . furosemide  20 mg Intravenous Once  . Gerhardt's butt cream   Topical TID  . heparin  5,000 Units Subcutaneous Q8H  . lactulose  20 g Oral BID WC  . midodrine  10 mg Oral TID WC  . pantoprazole  40 mg Oral BID   . rifaximin  550 mg Oral BID  . sodium chloride flush  10-40 mL Intracatheter Q12H  . venlafaxine XR  75 mg Oral Daily   Continuous Infusions: . phenylephrine (NEO-SYNEPHRINE) Adult infusion 0 mcg/min (08/10/16 2000)   PRN Meds:.acetaminophen, albuterol, ALPRAZolam, camphor-menthol, cyclobenzaprine, heparin, hydrOXYzine, loratadine, ondansetron (ZOFRAN) IV, sodium chloride, sodium chloride flush, traMADol    Assessment/Plan: 61 y.o. female with multiple comorbidities, admitted for 2 weeks, red rectal bleeding yesterday.  She is getting q8 hour heparin, I think for DVT prophylaxis.  I will stop that given her bleeding.  I will also order vit K since she has cirrhosis and INR is 1.5; may be able to reverse the coagulopathy a bit.  She has been taking PPI twice daily as outpatient, just restarted that yesterday. This seems relatively low volume, possilby hemorroid or slow diverticular related bleeding and with her multiple comorbid conditions recommend against colonoscopy for now.  Some of her anemia is related to renal failure.  Some is related to fluid overload, dilution.  Will follow along; will consider invasive testing (colonoscopy or flex sig) if  the measures above are not helpful.  Dr. Collene Mares is covering [facility] this weekend.   Milus Banister, MD  08/17/2016, 7:50 AM [facility] Pager [phone]

## 2016-08-17 NOTE — Progress Notes (Signed)
1 Oliguric AKI///CKD 3-4 Presumably, ischemic ATN. 2 Anemia esa/fe PRBCs on HD 3 DM 4 Massive obesity,NASH 5 Cirrhosis  Plan HD in AM and then remove HD cath with plans for replacement next week with a TDC.  We may need to begin looking at option for OP acute HD if no improvement soon.   Subjective: Interval History: Minimal UOP  Objective: Vital signs in last 24 hours: Temp:  [98 F (36.7 [name])-98.2 F (36.8 [name])] 98 F (36.7 [name]) (04/13 0450) Pulse Rate:  [78-96] 83 (04/13 0450) Resp:  [17-20] 20 (04/13 0450) BP: (105-130)/(41-62) 117/62 (04/13 0450) SpO2:  [94 %-99 %] 94 % (04/13 0450) Weight:  [121 kg (266 lb 12.1 oz)-123.1 kg (271 lb 6.2 oz)] 123.1 kg (271 lb 6.2 oz) (04/13 0830) Weight change: 0.075 kg (2.7 oz)  Intake/Output from previous day: 04/12 0701 - 04/13 0700 In: 1970 [P.O.:360; I.V.:100; Blood:1510] Out: 2500  Intake/Output this shift: Total I/O In: -  Out: 100 [Urine:100]  General appearance: alert and cooperative Resp: clear to auscultation bilaterally Cardio: regular rate and rhythm, S1, S2 normal, no murmur, click, rub or gallop GI: obese large ascites  Lab Results:  Recent Labs  08/16/16 1254 08/17/16 0522  WBC 5.2 5.5  HGB 6.7* 8.8*  HCT 20.2* 25.4*  PLT 64* 43*   BMET:  Recent Labs  08/16/16 1254 08/17/16 0522  NA 132* 132*  K 4.1 3.6  CL 102 98*  CO2 23 27  GLUCOSE 73 73  BUN 27* 14  CREATININE 5.17* 3.75*  CALCIUM 8.5* 8.4*   No results for input(s): PTH in the last 72 hours. Iron Studies: No results for input(s): IRON, TIBC, TRANSFERRIN, FERRITIN in the last 72 hours. Studies/Results: Dg Knee 1-2 Views Left  Result Date: 08/16/2016 CLINICAL DATA:  Recent fall, bilateral knee pain left greater than right EXAM: LEFT KNEE - 1-2 VIEW COMPARISON:  None. FINDINGS: Two views of the left knee submitted. No acute fracture or subluxation. Mild narrowing of medial joint compartment. No joint effusion. IMPRESSION: No acute fracture or  subluxation. Mild narrowing of medial joint compartment. Electronically Signed   By: Lahoma Crocker M.D.   On: 08/16/2016 12:10   Dg Knee 1-2 Views Right  Result Date: 08/16/2016 CLINICAL DATA:  Recent fall. Bilateral knee discomfort. No swelling or bruising. EXAM: RIGHT KNEE - 1-2 VIEW COMPARISON:  None. FINDINGS: There is no joint effusion. No fracture or subluxation identified. Sharpening of the tibial spines identified. There is mild medial and patellofemoral compartment narrowing. IMPRESSION: 1. No acute findings. 2. Osteoarthritis. Electronically Signed   By: Kerby Moors M.D.   On: 08/16/2016 12:19   Dg Chest Port 1 View  Result Date: 08/15/2016 CLINICAL DATA:  61 y/o  F; congestive heart failure. EXAM: PORTABLE CHEST 1 VIEW COMPARISON:  08/06/2016 chest radiograph FINDINGS: Stable cardiac silhouette. Right central venous catheter tip projects over upper SVC. Interstitial lung markings with improved aeration of lung bases. No airspace consolidation. No pleural effusion or pneumothorax. No acute osseous abnormality is evident. IMPRESSION: Interstitial lung markings probably representing interstitial pulmonary edema. Improved aeration of lung bases. Electronically Signed   By: Kristine Garbe M.D.   On: 08/15/2016 18:26    Scheduled: . sodium chloride   Intravenous Once  . sodium chloride   Intravenous Once  . Chlorhexidine Gluconate Cloth  6 each Topical Daily  . darbepoetin (ARANESP) injection - NON-DIALYSIS  200 mcg Subcutaneous Q Fri-1800  . ferumoxytol  510 mg Intravenous Weekly  .  furosemide  20 mg Intravenous Once  . Gerhardt's butt cream   Topical TID  . lactulose  20 g Oral BID WC  . midodrine  10 mg Oral TID WC  . pantoprazole  40 mg Oral BID  . phytonadione  5 mg Oral Daily  . rifaximin  550 mg Oral BID  . sodium chloride flush  10-40 mL Intracatheter Q12H  . venlafaxine XR  75 mg Oral Daily     LOS: 15 days   Loretta [name] 08/17/2016,1:10 PM \

## 2016-08-17 NOTE — Progress Notes (Signed)
PROGRESS NOTE    Loretta [name]  QIW:[id number]  DOB: 1956/04/13  DOA: 08/02/2016 PCP: Loretta Freeze, MD Outpatient Specialists:  Hospital course: HPI: Loretta [name] is a 61 y.o. female with medical history significant of liver cirrhosis secondary to NASH, history of edema treated with Lasix. Presenting today after patient was found to have elevated serum creatinine level at her primary care's office. Patient was recently treated for colitis of which she was sent home with Cipro and Flagyl. She completed 12 out of the 14 days of treatment. She still has some abdominal discomfort. Otherwise patient reports no new complaints. Patient denies any fevers.  61 yo MO(273lbs) hx of gastric bypass, non acholic cirrhosis , acute on chronic renal dz, not a candidate for liver tx due to obesity, who is moved to ICU for HD and HD cath placement. PCCM asked to follow in ICU. She is awake and alert and in NAD. RIJ HD catheter was placed and HD to be instituted per renal.  Assessment & Plan:   Acute on chronic kidney disease Stage 3: Acute tubular necrosis vs hepatorenal syndrome in the setting of acute blood loss. -Pt still dialysis dependent, to go to HD 4/12 for another treatment -Continue midodrine -DC Phenylphrine, has not needed. -Nephrology following for HD needs  COPD without acute exacerbation: Not on home therapy. PRN bronchodilators  OSA: CPAP as needed.  Hematochezia - GI consult appreciated. She was taken off subcut heparin and given vit K.     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 Undergoing transplant workup at [facility], however not felt to be a candidate due to obesity at this time.    Chronic normocytic anemia: Baseline Hb 7-8.   Fall - Xrays of both knees, no acute findings.    Thrombocytopenia: in setting of CLD - Platelets slowly rising  Depression and anxiety: Continue venlafaxine and tramadol  Headache: Continue tramadol  and Flexeril.  Itching: Loratadine, hydroxyzine as needed. Have dosed conservatively with concern for sedative effects with renal failure.  Subjective: Pt had 2 blood tinged BMs but only small amount of blood noted.    Objective: Vitals:   08/16/16 1630 08/16/16 2112 08/17/16 0450 08/17/16 0830  BP: (!) 130/56 (!) 122/55 117/62   Pulse: 90 78 83   Resp: '20 20 20   '$ Temp: 98 F (36.7 C) 98.2 F (36.8 C) 98 F (36.7 C)   TempSrc:  Oral Oral   SpO2: 99% 94% 94%   Weight: 121 kg (266 lb 12.1 oz)   123.1 kg (271 lb 6.2 oz)  Height:        Intake/Output Summary (Last 24 hours) at 08/17/16 1052 Last data filed at 08/17/16 0924  Gross per 24 hour  Intake             1610 ml  Output             2600 ml  Net             -990 ml   Filed Weights   08/16/16 1300 08/16/16 1630 08/17/16 0830  Weight: 123 kg (271 lb 2.7 oz) 121 kg (266 lb 12.1 oz) 123.1 kg (271 lb 6.2 oz)    Exam:  Constitutional: She is oriented to person, place, and time. No distress.  HENT: Head: Normocephalic and atraumatic.  Eyes: Conjunctivae are normal. No scleral icterus.  Neck: R IJ catheter  Cardiovascular: Normal rate and regular rhythm.   Murmur best appreciated in the left  lower sternal border.  Pulmonary/Chest: Effort normal. No respiratory distress.  Abdominal: Soft. She exhibits no distension.  Musculoskeletal: bilateral knee pain with palpation. She exhibits edema (1+ pitting edema in the bilateral lower extremities).  Neurological: She is alert and oriented to person, place, and time.  Skin: She is not diaphoretic.   Data Reviewed: Basic Metabolic Panel:  Recent Labs Lab 08/13/16 0438  08/14/16 0402 08/14/16 1954 08/15/16 0430 08/15/16 1610 08/16/16 0416 08/16/16 1254 08/17/16 0522  NA 136  < > 134* 133* 133* 134*  --  132* 132*  K 3.9  < > 5.0 4.1 4.1 4.1  --  4.1 3.6  CL 102  < > 103 104 102 101  --  102 98*  CO2 26  < > '24 24 24 22  '$ --  23 27  GLUCOSE 79  < > 73 99 67 88  --  73  73  BUN <5*  < > 11 17 21* 24*  --  27* 14  CREATININE 1.72*  < > 3.21* 4.30* 4.51* 4.90*  --  5.17* 3.75*  CALCIUM 8.3*  < > 8.7* 8.7* 8.8* 9.1  --  8.5* 8.4*  MG 2.4  --  2.6*  --  2.6*  --  2.8*  --  2.2  PHOS 2.2*  < > 3.2 3.6 3.8 3.7  --   --  3.7  < > = values in this interval not displayed. Liver Function Tests:  Recent Labs Lab 08/14/16 1954 08/15/16 0430 08/15/16 1610 08/16/16 1254 08/17/16 0522  AST  --   --   --  55*  --   ALT  --   --   --  19  --   ALKPHOS  --   --   --  106  --   BILITOT  --   --   --  1.8*  --   PROT  --   --   --  5.0*  --   ALBUMIN 2.9* 2.9* 3.0* 2.8* 2.9*   No results for input(s): LIPASE, AMYLASE in the last 168 hours. No results for input(s): AMMONIA in the last 168 hours. CBC:  Recent Labs Lab 08/14/16 0402 08/15/16 1630 08/16/16 0416 08/16/16 1254 08/17/16 0522  WBC 5.5 5.4 4.9 5.2 5.5  HGB 7.3* 7.1* 6.9* 6.7* 8.8*  HCT 21.7* 21.4* 20.2* 20.2* 25.4*  MCV 90.4 91.8 91.8 91.0 88.8  PLT 55* 68* 60* 64* 43*   Cardiac Enzymes: No results for input(s): CKTOTAL, CKMB, CKMBINDEX, TROPONINI in the last 168 hours. CBG (last 3)  No results for input(s): GLUCAP in the last 72 hours. No results found for this or any previous visit (from the past 240 hour(s)).  Studies: Dg Knee 1-2 Views Left  Result Date: 08/16/2016 CLINICAL DATA:  Recent fall, bilateral knee pain left greater than right EXAM: LEFT KNEE - 1-2 VIEW COMPARISON:  None. FINDINGS: Two views of the left knee submitted. No acute fracture or subluxation. Mild narrowing of medial joint compartment. No joint effusion. IMPRESSION: No acute fracture or subluxation. Mild narrowing of medial joint compartment. Electronically Signed   By: Lahoma Crocker M.D.   On: 08/16/2016 12:10   Dg Knee 1-2 Views Right  Result Date: 08/16/2016 CLINICAL DATA:  Recent fall. Bilateral knee discomfort. No swelling or bruising. EXAM: RIGHT KNEE - 1-2 VIEW COMPARISON:  None. FINDINGS: There is no joint  effusion. No fracture or subluxation identified. Sharpening of the tibial spines identified. There is mild medial and  patellofemoral compartment narrowing. IMPRESSION: 1. No acute findings. 2. Osteoarthritis. Electronically Signed   By: Kerby Moors M.D.   On: 08/16/2016 12:19   Dg Chest Port 1 View  Result Date: 08/15/2016 CLINICAL DATA:  61 y/o  F; congestive heart failure. EXAM: PORTABLE CHEST 1 VIEW COMPARISON:  08/06/2016 chest radiograph FINDINGS: Stable cardiac silhouette. Right central venous catheter tip projects over upper SVC. Interstitial lung markings with improved aeration of lung bases. No airspace consolidation. No pleural effusion or pneumothorax. No acute osseous abnormality is evident. IMPRESSION: Interstitial lung markings probably representing interstitial pulmonary edema. Improved aeration of lung bases. Electronically Signed   By: Kristine Garbe M.D.   On: 08/15/2016 18:26    Scheduled Meds: . sodium chloride   Intravenous Once  . sodium chloride   Intravenous Once  . Chlorhexidine Gluconate Cloth  6 each Topical Daily  . darbepoetin (ARANESP) injection - NON-DIALYSIS  200 mcg Subcutaneous Q Fri-1800  . ferumoxytol  510 mg Intravenous Weekly  . furosemide  20 mg Intravenous Once  . Gerhardt's butt cream   Topical TID  . lactulose  20 g Oral BID WC  . midodrine  10 mg Oral TID WC  . pantoprazole  40 mg Oral BID  . phytonadione  5 mg Oral Daily  . rifaximin  550 mg Oral BID  . sodium chloride flush  10-40 mL Intracatheter Q12H  . venlafaxine XR  75 mg Oral Daily   Continuous Infusions: . phenylephrine (NEO-SYNEPHRINE) Adult infusion 0 mcg/min (08/10/16 2000)   Active Problems:   Liver cirrhosis secondary to NASH (HCC)   Acute on chronic renal failure (HCC)   ARF (acute renal failure) (HCC)   Colitis   Hypertension   COPD (chronic obstructive pulmonary disease) (HCC)   Nephrolithiasis   Depression   Thrombocytopenia (HCC)   Morbid obesity (HCC)    CKD (chronic kidney disease) stage 4, GFR 15-29 ml/min (HCC)   Hypotension   Acute renal failure (ARF) ([place])  Time spent:   Irwin Brakeman, MD, FAAFP [facility] Pager [phone]  If 7PM-7AM, please contact night-coverage [email or web address] Password [email or web address] 08/17/2016, 10:52 AM    LOS: 15 days

## 2016-08-18 LAB — RENAL FUNCTION PANEL
ALBUMIN: 2.9 g/dL — AB (ref 3.5–5.0)
Anion gap: 8 (ref 5–15)
BUN: 21 mg/dL — AB (ref 6–20)
CO2: 24 mmol/L (ref 22–32)
CREATININE: 4.48 mg/dL — AB (ref 0.44–1.00)
Calcium: 8.6 mg/dL — ABNORMAL LOW (ref 8.9–10.3)
Chloride: 98 mmol/L — ABNORMAL LOW (ref 101–111)
GFR, EST AFRICAN AMERICAN: 11 mL/min — AB (ref >60)
GFR, EST NON AFRICAN AMERICAN: 10 mL/min — AB (ref >60)
Glucose, Bld: 81 mg/dL (ref 65–99)
PHOSPHORUS: 3.8 mg/dL (ref 2.5–4.6)
POTASSIUM: 3.6 mmol/L (ref 3.5–5.1)
Sodium: 130 mmol/L — ABNORMAL LOW (ref 135–145)

## 2016-08-18 LAB — CBC
HEMATOCRIT: 26.1 % — AB (ref 36.0–46.0)
Hemoglobin: 8.8 g/dL — ABNORMAL LOW (ref 12.0–15.0)
MCH: 30 pg (ref 26.0–34.0)
MCHC: 33.7 g/dL (ref 30.0–36.0)
MCV: 89.1 fL (ref 78.0–100.0)
Platelets: 56 10*3/uL — ABNORMAL LOW (ref 150–400)
RBC: 2.93 MIL/uL — AB (ref 3.87–5.11)
RDW: 20.5 % — AB (ref 11.5–15.5)
WBC: 4.5 10*3/uL (ref 4.0–10.5)

## 2016-08-18 LAB — MAGNESIUM: MAGNESIUM: 2.3 mg/dL (ref 1.7–2.4)

## 2016-08-18 MED ORDER — HYDROXYZINE HCL 25 MG PO TABS
25.0000 mg | ORAL_TABLET | Freq: Once | ORAL | Status: AC
  Administered 2016-08-18: 25 mg via ORAL

## 2016-08-18 MED ORDER — SODIUM CHLORIDE 0.9 % IV SOLN: 100.0000 mL | INTRAVENOUS | Status: DC | PRN

## 2016-08-18 MED ORDER — HEPARIN SODIUM (PORCINE) 1000 UNIT/ML DIALYSIS: 1000.0000 [IU] | INTRAMUSCULAR | Status: DC | PRN

## 2016-08-18 MED ORDER — HYDROXYZINE HCL 25 MG PO TABS
ORAL_TABLET | ORAL | Status: AC
  Filled 2016-08-18: qty 1

## 2016-08-18 MED ORDER — ALTEPLASE 2 MG IJ SOLR: 2.0000 mg | Freq: Once | INTRAMUSCULAR | Status: DC | PRN

## 2016-08-18 MED ORDER — LIDOCAINE-PRILOCAINE 2.5-2.5 % EX CREA: 1.0000 "application " | TOPICAL_CREAM | CUTANEOUS | Status: DC | PRN

## 2016-08-18 MED ORDER — PENTAFLUOROPROP-TETRAFLUOROETH EX AERO: 1.0000 "application " | INHALATION_SPRAY | CUTANEOUS | Status: DC | PRN

## 2016-08-18 MED ORDER — MIDODRINE HCL 5 MG PO TABS
ORAL_TABLET | ORAL | Status: AC
  Filled 2016-08-18: qty 2

## 2016-08-18 MED ORDER — LIDOCAINE HCL (PF) 1 % IJ SOLN: 5.0000 mL | INTRAMUSCULAR | Status: DC | PRN

## 2016-08-18 MED ORDER — DARBEPOETIN ALFA 200 MCG/0.4ML IJ SOSY
PREFILLED_SYRINGE | INTRAMUSCULAR | Status: AC
  Filled 2016-08-18: qty 0.4

## 2016-08-18 MED ORDER — MIDODRINE HCL 5 MG PO TABS
ORAL_TABLET | ORAL | Status: AC
Start: 2016-08-18 — End: 2016-08-18
  Filled 2016-08-18: qty 2

## 2016-08-18 MED ORDER — HEPARIN SODIUM (PORCINE) 1000 UNIT/ML DIALYSIS: 20.0000 [IU]/kg | INTRAMUSCULAR | Status: DC | PRN

## 2016-08-18 NOTE — Progress Notes (Signed)
1 Oliguric AKI///CKD 3-4 Presumably, ischemic ATN. 2 Anemia esa/fe/PRBCs 3 DM 4 Massive obesity,NASH 5 Cirrhosis  Plan HD Today.  Remove HD Temp cath and see if it can be replaced Monday. For rehab.  Subjective: Interval History: Made 100cc urine  Objective: Vital signs in last 24 hours: Temp:  [97.9 F (36.6 C)-98.2 F (36.8 C)] 98.2 F (36.8 C) (04/14 0815) Pulse Rate:  [79-100] 100 (04/14 1130) Resp:  [15-19] 16 (04/14 1130) BP: (111-126)/(48-88) 121/48 (04/14 1130) SpO2:  [99 %-100 %] 100 % (04/14 0815) Weight:  [123.3 kg (271 lb 13.2 oz)] 123.3 kg (271 lb 13.2 oz) (04/14 0815) Weight change: 0.1 kg (3.5 oz)  Intake/Output from previous day: 04/13 0701 - 04/14 0700 In: 582 [P.O.:582] Out: 100 [Urine:100] Intake/Output this shift: No intake/output data recorded.  General appearance: alert and cooperative GI: large with ascites R IJ cath  Lab Results:  Recent Labs  08/17/16 0522 08/18/16 0351  WBC 5.5 4.5  HGB 8.8* 8.8*  HCT 25.4* 26.1*  PLT 43* 56*   BMET:  Recent Labs  08/17/16 0522 08/18/16 0351  NA 132* 130*  K 3.6 3.6  CL 98* 98*  CO2 27 24  GLUCOSE 73 81  BUN 14 21*  CREATININE 3.75* 4.48*  CALCIUM 8.4* 8.6*   No results for input(s): PTH in the last 72 hours. Iron Studies: No results for input(s): IRON, TIBC, TRANSFERRIN, FERRITIN in the last 72 hours. Studies/Results: No results found.  Scheduled: . midodrine      . sodium chloride   Intravenous Once  . sodium chloride   Intravenous Once  . Chlorhexidine Gluconate Cloth  6 each Topical Daily  . darbepoetin (ARANESP) injection - NON-DIALYSIS  200 mcg Subcutaneous Q Fri-1800  . furosemide  20 mg Intravenous Once  . Gerhardt's butt cream   Topical TID  . lactulose  20 g Oral BID WC  . midodrine  10 mg Oral TID WC  . pantoprazole  40 mg Oral BID  . phytonadione  5 mg Oral Daily  . rifaximin  550 mg Oral BID  . sodium chloride flush  10-40 mL Intracatheter Q12H  . venlafaxine XR   75 mg Oral Daily     LOS: 16 days   Quatisha Zylka C 08/18/2016,12:14 PM

## 2016-08-18 NOTE — Progress Notes (Addendum)
0750 Pt A&O x4,  to Hemodialysis via bed.  1350 Received pt back from Hemodialysis, A&O x4, denies discomfort. Right IJ HD cath site dry intact.

## 2016-08-18 NOTE — Progress Notes (Signed)
Dressing to Right neck area is dry and intact.  Noted Vas-Cath was removed today.  Hyperfix type reinforce.

## 2016-08-18 NOTE — Progress Notes (Signed)
PROGRESS NOTE    Loretta [name]  HWE:[id number]  DOB: 1956/02/22  DOA: 08/02/2016 PCP: Cyndy Freeze, MD Outpatient Specialists:  Hospital course: HPI: Loretta [name] is a 61 y.o. female with medical history significant of liver cirrhosis secondary to NASH, history of edema treated with Lasix. Presenting today after patient was found to have elevated serum creatinine level at her primary care's office. Patient was recently treated for colitis of which she was sent home with Cipro and Flagyl. She completed 12 out of the 14 days of treatment. She still has some abdominal discomfort. Otherwise patient reports no new complaints. Patient denies any fevers.  61 yo MO(273lbs) hx of gastric bypass, non acholic cirrhosis , acute on chronic renal dz, not a candidate for liver tx due to obesity, who is moved to ICU for HD and HD cath placement. PCCM asked to follow in ICU. She is awake and alert and in NAD. RIJ HD catheter was placed and HD to be instituted per renal.  Assessment & Plan:   Acute on chronic kidney disease Stage 3: Acute tubular necrosis vs hepatorenal syndrome in the setting of acute blood loss. -Pt still dialysis dependent,  HD 4/12 and 4/14 -Continue midodrine -DC Phenylphrine, has not needed. -Nephrology following for HD needs  COPD without acute exacerbation: Not on home therapy. PRN bronchodilators  OSA: CPAP as needed.  Hematochezia - GI consult appreciated. She was taken off subcut heparin and given vit K.   Hg stable at 8.8.   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 Undergoing transplant workup at [facility], however not felt to be a candidate due to obesity at this time.    Chronic normocytic anemia: Baseline Hb 7-8.   Fall - Xrays of both knees, no acute findings.    Thrombocytopenia: in setting of CLD - Platelets slowly rising  Depression and anxiety: Continue venlafaxine and tramadol  Headache: Continue tramadol and  Flexeril.  Itching: Loratadine, hydroxyzine as needed. Have dosed conservatively with concern for sedative effects with renal failure.  Subjective: Pt seen in HD, complaining of itching    Objective: Vitals:   08/18/16 0930 08/18/16 1000 08/18/16 1030 08/18/16 1130  BP: (!) 125/54 (!) 119/52 (!) 116/53 (!) 121/48  Pulse: 92 100 100 100  Resp: '18 16 17   '$ Temp:      TempSrc:      SpO2:      Weight:      Height:        Intake/Output Summary (Last 24 hours) at 08/18/16 1136 Last data filed at 08/17/16 1700  Gross per 24 hour  Intake              462 ml  Output                0 ml  Net              462 ml   Filed Weights   08/16/16 1630 08/17/16 0830 08/18/16 0815  Weight: 121 kg (266 lb 12.1 oz) 123.1 kg (271 lb 6.2 oz) 123.3 kg (271 lb 13.2 oz)    Exam:  Constitutional: She is oriented to person, place, and time. No distress.  HENT: Head: Normocephalic and atraumatic.  Eyes: Conjunctivae are normal. No scleral icterus.  Neck: R IJ catheter  Cardiovascular: Normal rate and regular rhythm.   Murmur best appreciated in the left lower sternal border.  Pulmonary/Chest: Effort normal. No respiratory distress.  Abdominal: Soft. She exhibits no distension.  Musculoskeletal: bilateral knee pain with palpation. She exhibits edema (1+ pitting edema in the bilateral lower extremities).  Neurological: She is alert and oriented to person, place, and time.  Skin: She is not diaphoretic.   Data Reviewed: Basic Metabolic Panel:  Recent Labs Lab 08/14/16 0402 08/14/16 1954 08/15/16 0430 08/15/16 1610 08/16/16 0416 08/16/16 1254 08/17/16 0522 08/18/16 0351  NA 134* 133* 133* 134*  --  132* 132* 130*  K 5.0 4.1 4.1 4.1  --  4.1 3.6 3.6  CL 103 104 102 101  --  102 98* 98*  CO2 '24 24 24 22  '$ --  '23 27 24  '$ GLUCOSE 73 99 67 88  --  73 73 81  BUN 11 17 21* 24*  --  27* 14 21*  CREATININE 3.21* 4.30* 4.51* 4.90*  --  5.17* 3.75* 4.48*  CALCIUM 8.7* 8.7* 8.8* 9.1  --  8.5* 8.4*  8.6*  MG 2.6*  --  2.6*  --  2.8*  --  2.2 2.3  PHOS 3.2 3.6 3.8 3.7  --   --  3.7 3.8   Liver Function Tests:  Recent Labs Lab 08/15/16 0430 08/15/16 1610 08/16/16 1254 08/17/16 0522 08/18/16 0351  AST  --   --  55*  --   --   ALT  --   --  19  --   --   ALKPHOS  --   --  106  --   --   BILITOT  --   --  1.8*  --   --   PROT  --   --  5.0*  --   --   ALBUMIN 2.9* 3.0* 2.8* 2.9* 2.9*   No results for input(s): LIPASE, AMYLASE in the last 168 hours. No results for input(s): AMMONIA in the last 168 hours. CBC:  Recent Labs Lab 08/15/16 1630 08/16/16 0416 08/16/16 1254 08/17/16 0522 08/18/16 0351  WBC 5.4 4.9 5.2 5.5 4.5  HGB 7.1* 6.9* 6.7* 8.8* 8.8*  HCT 21.4* 20.2* 20.2* 25.4* 26.1*  MCV 91.8 91.8 91.0 88.8 89.1  PLT 68* 60* 64* 43* 56*   Cardiac Enzymes: No results for input(s): CKTOTAL, CKMB, CKMBINDEX, TROPONINI in the last 168 hours. CBG (last 3)  No results for input(s): GLUCAP in the last 72 hours. No results found for this or any previous visit (from the past 240 hour(s)).  Studies: Dg Knee 1-2 Views Left  Result Date: 08/16/2016 CLINICAL DATA:  Recent fall, bilateral knee pain left greater than right EXAM: LEFT KNEE - 1-2 VIEW COMPARISON:  None. FINDINGS: Two views of the left knee submitted. No acute fracture or subluxation. Mild narrowing of medial joint compartment. No joint effusion. IMPRESSION: No acute fracture or subluxation. Mild narrowing of medial joint compartment. Electronically Signed   By: Lahoma Crocker M.D.   On: 08/16/2016 12:10   Dg Knee 1-2 Views Right  Result Date: 08/16/2016 CLINICAL DATA:  Recent fall. Bilateral knee discomfort. No swelling or bruising. EXAM: RIGHT KNEE - 1-2 VIEW COMPARISON:  None. FINDINGS: There is no joint effusion. No fracture or subluxation identified. Sharpening of the tibial spines identified. There is mild medial and patellofemoral compartment narrowing. IMPRESSION: 1. No acute findings. 2. Osteoarthritis.  Electronically Signed   By: Kerby Moors M.D.   On: 08/16/2016 12:19    Scheduled Meds: . midodrine      . sodium chloride   Intravenous Once  . sodium chloride   Intravenous Once  . Chlorhexidine Gluconate Cloth  6 each Topical Daily  . darbepoetin (ARANESP) injection - NON-DIALYSIS  200 mcg Subcutaneous Q Fri-1800  . furosemide  20 mg Intravenous Once  . Gerhardt's butt cream   Topical TID  . lactulose  20 g Oral BID WC  . midodrine  10 mg Oral TID WC  . pantoprazole  40 mg Oral BID  . phytonadione  5 mg Oral Daily  . rifaximin  550 mg Oral BID  . sodium chloride flush  10-40 mL Intracatheter Q12H  . venlafaxine XR  75 mg Oral Daily   Continuous Infusions: . phenylephrine (NEO-SYNEPHRINE) Adult infusion 0 mcg/min (08/10/16 2000)   Active Problems:   Liver cirrhosis secondary to NASH (HCC)   Acute on chronic renal failure (HCC)   ARF (acute renal failure) (HCC)   Colitis   Hypertension   COPD (chronic obstructive pulmonary disease) (HCC)   Nephrolithiasis   Depression   Thrombocytopenia (HCC)   Morbid obesity (HCC)   CKD (chronic kidney disease) stage 4, GFR 15-29 ml/min (HCC)   Hypotension   Acute renal failure (ARF) (HCC)   CHF (congestive heart failure) ([place])  Time spent:   Irwin Brakeman, MD, FAAFP [facility] Pager [phone]  If 7PM-7AM, please contact night-coverage [email or web address] Password [email or web address] 08/18/2016, 11:36 AM    LOS: 16 days

## 2016-08-18 NOTE — Progress Notes (Signed)
CROSS COVER [facility] Subjective: Patient went to hemodialysis today has she reports having one episode of rectal bleeding earlier this morning. She denies having any abdominal pain nausea vomiting.  Objective: Vital signs in last 24 hours: Temp:  [97.9 F (36.6 C)-98.2 F (36.8 C)] 98.2 F (36.8 C) (04/14 0815) Pulse Rate:  [79-98] 92 (04/14 0930) Resp:  [17-19] 18 (04/14 0930) BP: (111-126)/(53-88) 125/54 (04/14 0930) SpO2:  [99 %-100 %] 100 % (04/14 0815) Weight:  [123.3 kg (271 lb 13.2 oz)] 123.3 kg (271 lb 13.2 oz) (04/14 0815) Last BM Date: 08/16/16  Intake/Output from previous day: 04/13 0701 - 04/14 0700 In: 582 [P.O.:582] Out: 100 [Urine:100] Intake/Output this shift: No intake/output data recorded.  General appearance: alert, cooperative and morbidly obese Resp: clear to auscultation bilaterally Cardio: regular rate and rhythm, S1, S2 normal, no murmur, click, rub or gallop GI: soft, morbidly obese, non-tender; bowel sounds normal; no masses,  no organomegaly  Lab Results:  Recent Labs  08/16/16 1254 08/17/16 0522 08/18/16 0351  WBC 5.2 5.5 4.5  HGB 6.7* 8.8* 8.8*  HCT 20.2* 25.4* 26.1*  PLT 64* 43* 56*   BMET  Recent Labs  08/16/16 1254 08/17/16 0522 08/18/16 0351  NA 132* 132* 130*  K 4.1 3.6 3.6  CL 102 98* 98*  CO2 '23 27 24  '$ GLUCOSE 73 73 81  BUN 27* 14 21*  CREATININE 5.17* 3.75* 4.48*  CALCIUM 8.5* 8.4* 8.6*   LFT  Recent Labs  08/16/16 1254  08/18/16 0351  PROT 5.0*  --   --   ALBUMIN 2.8*  < > 2.9*  AST 55*  --   --   ALT 19  --   --   ALKPHOS 106  --   --   BILITOT 1.8*  --   --   < > = values in this interval not displayed. PT/INR  Recent Labs  08/17/16 0522  LABPROT 18.4*  INR 1.51   Hepatitis Panel  Recent Labs  08/16/16 1300  HEPBSAG Negative   Studies/Results: Dg Knee 1-2 Views Left  Result Date: 08/16/2016 CLINICAL DATA:  Recent fall, bilateral knee pain left greater than right EXAM: LEFT KNEE - 1-2 VIEW  COMPARISON:  None. FINDINGS: Two views of the left knee submitted. No acute fracture or subluxation. Mild narrowing of medial joint compartment. No joint effusion. IMPRESSION: No acute fracture or subluxation. Mild narrowing of medial joint compartment. Electronically Signed   By: Lahoma Crocker M.D.   On: 08/16/2016 12:10   Dg Knee 1-2 Views Right  Result Date: 08/16/2016 CLINICAL DATA:  Recent fall. Bilateral knee discomfort. No swelling or bruising. EXAM: RIGHT KNEE - 1-2 VIEW COMPARISON:  None. FINDINGS: There is no joint effusion. No fracture or subluxation identified. Sharpening of the tibial spines identified. There is mild medial and patellofemoral compartment narrowing. IMPRESSION: 1. No acute findings. 2. Osteoarthritis. Electronically Signed   By: Kerby Moors M.D.   On: 08/16/2016 12:19    Medications: I have reviewed the patient's current medications.  Assessment/Plan: 1) Rectal bleeding with anemia-probably diverticular bleed; I agree with Dr. Owens Loffler that the patient does not need any invasive procedures at this time. Continue supportive care.  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  3) Acute and chronic kidney disease stage III.  4) Morbid obesity.  LOS: 16 days   Bryceson Grape 08/18/2016, 10:06 AM

## 2016-08-18 NOTE — Progress Notes (Signed)
Call to patient's room.  Patient was having a nose bleed. Patient reports that she does have nose bleeds at home.  Patient leaned head forward and pinched nose to assist with stopping nose bleed.  Nurse at bedside.  Once nose bleed completed patient assisted to bathroom.  Noted that patient has bloody urine noted.  Pt. Is asymptotic at this time.  Will continue to follow progress of pt.

## 2016-08-18 NOTE — Progress Notes (Signed)
Report given to Jacques Earthly, Dialysis nurse.  He reports that patient will go to dialysis today about 8am today.  Pt. Informed.

## 2016-08-19 LAB — RENAL FUNCTION PANEL
Albumin: 2.8 g/dL — ABNORMAL LOW (ref 3.5–5.0)
Anion gap: 4 — ABNORMAL LOW (ref 5–15)
BUN: 12 mg/dL (ref 6–20)
CALCIUM: 8.4 mg/dL — AB (ref 8.9–10.3)
CO2: 28 mmol/L (ref 22–32)
CREATININE: 3.07 mg/dL — AB (ref 0.44–1.00)
Chloride: 102 mmol/L (ref 101–111)
GFR calc non Af Amer: 16 mL/min — ABNORMAL LOW (ref >60)
GFR, EST AFRICAN AMERICAN: 18 mL/min — AB (ref >60)
GLUCOSE: 79 mg/dL (ref 65–99)
Phosphorus: 3.4 mg/dL (ref 2.5–4.6)
Potassium: 4.2 mmol/L (ref 3.5–5.1)
SODIUM: 134 mmol/L — AB (ref 135–145)

## 2016-08-19 LAB — MAGNESIUM: Magnesium: 2.3 mg/dL (ref 1.7–2.4)

## 2016-08-19 MED ORDER — HYDROXYZINE HCL 25 MG PO TABS
25.0000 mg | ORAL_TABLET | Freq: Three times a day (TID) | ORAL | Status: DC | PRN
  Administered 2016-08-19 – 2016-08-20 (×2): 25 mg via ORAL
  Filled 2016-08-19 (×2): qty 1

## 2016-08-19 NOTE — Progress Notes (Signed)
Cross cover [facility] Subjective: Since I last evaluated the patient, there has not been much change in her overall condition she's had 2 loose BMs today with some blood stained stools.    Objective: Vital signs in last 24 hours: Temp:  [97.5 F (36.4 C)-98.5 F (36.9 C)] 98.3 F (36.8 C) (04/15 0417) Pulse Rate:  [78-100] 78 (04/15 0417) Resp:  [16-19] 19 (04/15 0417) BP: (120-136)/(43-56) 122/50 (04/15 0417) SpO2:  [100 %] 100 % (04/15 0417) Weight:  [120.8 kg (266 lb 5.1 oz)] 120.8 kg (266 lb 5.1 oz) (04/14 1223) Last BM Date: 08/16/16  Intake/Output from previous day: 04/14 0701 - 04/15 0700 In: 222 [P.O.:222] Out: 2500  Intake/Output this shift: No intake/output data recorded.  General appearance: alert, cooperative, appears stated age, no distress and mildly obese Resp: clear to auscultation bilaterally Cardio: regular rate and rhythm, S1, S2 normal, no murmur, click, rub or gallop GI: soft, non-tender; bowel sounds normal; no masses,  no organomegaly  Lab Results:  Recent Labs  08/16/16 1254 08/17/16 0522 08/18/16 0351  WBC 5.2 5.5 4.5  HGB 6.7* 8.8* 8.8*  HCT 20.2* 25.4* 26.1*  PLT 64* 43* 56*   BMET  Recent Labs  08/17/16 0522 08/18/16 0351 08/19/16 0338  NA 132* 130* 134*  K 3.6 3.6 4.2  CL 98* 98* 102  CO2 '27 24 28  '$ GLUCOSE 73 81 79  BUN 14 21* 12  CREATININE 3.75* 4.48* 3.07*  CALCIUM 8.4* 8.6* 8.4*   LFT  Recent Labs  08/16/16 1254  08/19/16 0338  PROT 5.0*  --   --   ALBUMIN 2.8*  < > 2.8*  AST 55*  --   --   ALT 19  --   --   ALKPHOS 106  --   --   BILITOT 1.8*  --   --   < > = values in this interval not displayed. PT/INR  Recent Labs  08/17/16 0522  LABPROT 18.4*  INR 1.51   Hepatitis Panel  Recent Labs  08/16/16 1300  HEPBSAG Negative   Medications: I have reviewed the patient's current medications.  Assessment/Plan:  1) Nash cirrhosis/morbid obesity/GI bleeding/ anemia secondary to chronic disease & blood  loss/Thrombocytopenia-currently stable-supportive care for now-on rifaximin and lactulose.Marland Kitchen  2) Oliguric acute kidney injury-presumed ATN-on HD  LOS: 17 days   Vern Guerette 08/19/2016, 11:15 AM

## 2016-08-19 NOTE — Progress Notes (Signed)
1 Oliguric AKI///CKD 3-4 Presumably, ischemic ATN. 2 Anemia esa/fe/PRBCs 3 DM 4 Massive obesity,NASH 5 Cirrhosis 6 L GI Bleed  Plan HD Tuesday.  Needs replacement of HD [facility] Mon or Tues  Subjective: Interval History: {Minimal UOP  Objective: Vital signs in last 24 hours: Temp:  [98.1 F (36.7 C)-98.3 F (36.8 C)] 98.1 F (36.7 C) (04/15 1437) Pulse Rate:  [78-98] 83 (04/15 1437) Resp:  [18-19] 18 (04/15 1437) BP: (108-136)/(44-56) 108/44 (04/15 1437) SpO2:  [97 %-100 %] 97 % (04/15 1437) Weight change: 0.2 kg (7.1 oz)  Intake/Output from previous day: 04/14 0701 - 04/15 0700 In: 222 [P.O.:222] Out: 2500  Intake/Output this shift: Total I/O In: 840 [P.O.:840] Out: -   General appearance: alert and cooperative Neck: no adenopathy, no carotid bruit, no JVD, supple, symmetrical, trachea midline, thyroid not enlarged, symmetric, no tenderness/mass/nodules and HD cat out Chest wall: no tenderness Cardio: regular rate and rhythm, S1, S2 normal, no murmur, click, rub or gallop GI: soft, non-tender; bowel sounds normal; no masses,  no organomegaly and large with ascites  Lab Results:  Recent Labs  08/17/16 0522 08/18/16 0351  WBC 5.5 4.5  HGB 8.8* 8.8*  HCT 25.4* 26.1*  PLT 43* 56*   BMET:  Recent Labs  08/18/16 0351 08/19/16 0338  NA 130* 134*  K 3.6 4.2  CL 98* 102  CO2 24 28  GLUCOSE 81 79  BUN 21* 12  CREATININE 4.48* 3.07*  CALCIUM 8.6* 8.4*   No results for input(s): PTH in the last 72 hours. Iron Studies: No results for input(s): IRON, TIBC, TRANSFERRIN, FERRITIN in the last 72 hours. Studies/Results: No results found.  Scheduled: . sodium chloride   Intravenous Once  . sodium chloride   Intravenous Once  . Chlorhexidine Gluconate Cloth  6 each Topical Daily  . darbepoetin (ARANESP) injection - NON-DIALYSIS  200 mcg Subcutaneous Q Fri-1800  . furosemide  20 mg Intravenous Once  . Gerhardt's butt cream   Topical TID  . lactulose  20 g Oral  BID WC  . midodrine  10 mg Oral TID WC  . pantoprazole  40 mg Oral BID  . rifaximin  550 mg Oral BID  . sodium chloride flush  10-40 mL Intracatheter Q12H  . venlafaxine XR  75 mg Oral Daily     LOS: 17 days   Dutchess Crosland C 08/19/2016,4:31 PM

## 2016-08-19 NOTE — Progress Notes (Signed)
PROGRESS NOTE   Loretta [name]  SWF:[id number]  DOB: 1955/06/03  DOA: 08/02/2016 PCP: Cyndy Freeze, MD   Hospital course: HPI: Loretta [name] is a 61 y.o. female with medical history significant of liver cirrhosis secondary to NASH, history of edema treated with Lasix. Presenting today after patient was found to have elevated serum creatinine level at her primary care's office. Patient was recently treated for colitis of which she was sent home with Cipro and Flagyl. She completed 12 out of the 14 days of treatment. She still has some abdominal discomfort. Otherwise patient reports no new complaints. Patient denies any fevers.  60 yo MO(273lbs) hx of gastric bypass, non acholic cirrhosis , acute on chronic renal dz, not a candidate for liver tx due to obesity, who is moved to ICU for HD and HD cath placement. PCCM asked to follow in ICU. She is awake and alert and in NAD. RIJ HD catheter was placed and HD to be instituted per renal.  Assessment & Plan:   Acute on chronic kidney disease Stage 3: Acute tubular necrosis vs hepatorenal syndrome in the setting of acute blood loss. -Pt still dialysis dependent,  HD 4/12 and 4/14 -Continue midodrine -DC Phenylphrine, has not needed. -Nephrology following for HD needs -RIJ catheter removed 4/14  COPD without acute exacerbation: Not on home therapy. PRN bronchodilators  OSA: CPAP as needed.  Hematochezia - GI consult appreciated. She was taken off subcut heparin and given vit K.  They are not planning colonoscopy at this time, continue supportive care.  Check CBC in AM.    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 Undergoing [facility] workup [facility], however not felt to be a candidate due to obesity at this time.    Chronic normocytic anemia: Baseline Hb 7-8.   Fall - Xrays of both knees, no acute findings.    Thrombocytopenia: in setting of CLD - Platelets slowly rising, off  heparin  Depression and anxiety: Continue venlafaxine and tramadol  Headache: Continue tramadol and Flexeril.  Pruritus: Loratadine, hydroxyzine as needed. Increase atarax to 25 mg TID prn.   Subjective: Pt still itching pretty bad, says the 25 mg of atarax worked better but wears off quickly, had some more bloody stools  Objective: Vitals:   08/18/16 1223 08/18/16 1358 08/18/16 2003 08/19/16 0417  BP: (!) 123/54 (!) 120/43 (!) 136/56 (!) 122/50  Pulse: 99 97 98 78  Resp: '16 18 18 19  '$ Temp: 98.5 F (36.9 C) 97.5 F (36.4 C) 98.3 F (36.8 C) 98.3 F (36.8 C)  TempSrc: Oral Axillary Oral Oral  SpO2: 100% 100% 100% 100%  Weight: 120.8 kg (266 lb 5.1 oz)     Height:        Intake/Output Summary (Last 24 hours) at 08/19/16 1038 Last data filed at 08/18/16 1401  Gross per 24 hour  Intake              222 ml  Output             2500 ml  Net            -2278 ml   Filed Weights   08/17/16 0830 08/18/16 0815 08/18/16 1223  Weight: 123.1 kg (271 lb 6.2 oz) 123.3 kg (271 lb 13.2 oz) 120.8 kg (266 lb 5.1 oz)    Exam:  Constitutional: She is oriented to person, place, and time. No distress.  HENT: Head: Normocephalic and atraumatic.  Eyes: Conjunctivae are normal. No scleral  icterus.  Neck: bandages from RIJ removed 4/14 Cardiovascular: Normal rate and regular rhythm.   Murmur best appreciated in the left lower sternal border.  Pulmonary/Chest: Effort normal. No respiratory distress.  Abdominal: Soft. She exhibits no distension.  Musculoskeletal: bilateral knee pain with palpation. She exhibits edema (1+ pitting edema in the bilateral lower extremities).  Neurological: She is alert and oriented to person, place, and time.  Skin: She is not diaphoretic.   Data Reviewed: Basic Metabolic Panel:  Recent Labs Lab 08/15/16 0430 08/15/16 1610 08/16/16 0416 08/16/16 1254 08/17/16 0522 08/18/16 0351 08/19/16 0338  NA 133* 134*  --  132* 132* 130* 134*  K 4.1 4.1  --   4.1 3.6 3.6 4.2  CL 102 101  --  102 98* 98* 102  CO2 24 22  --  '23 27 24 28  '$ GLUCOSE 67 88  --  73 73 81 79  BUN 21* 24*  --  27* 14 21* 12  CREATININE 4.51* 4.90*  --  5.17* 3.75* 4.48* 3.07*  CALCIUM 8.8* 9.1  --  8.5* 8.4* 8.6* 8.4*  MG 2.6*  --  2.8*  --  2.2 2.3 2.3  PHOS 3.8 3.7  --   --  3.7 3.8 3.4   Liver Function Tests:  Recent Labs Lab 08/15/16 1610 08/16/16 1254 08/17/16 0522 08/18/16 0351 08/19/16 0338  AST  --  55*  --   --   --   ALT  --  19  --   --   --   ALKPHOS  --  106  --   --   --   BILITOT  --  1.8*  --   --   --   PROT  --  5.0*  --   --   --   ALBUMIN 3.0* 2.8* 2.9* 2.9* 2.8*   No results for input(s): LIPASE, AMYLASE in the last 168 hours. No results for input(s): AMMONIA in the last 168 hours. CBC:  Recent Labs Lab 08/15/16 1630 08/16/16 0416 08/16/16 1254 08/17/16 0522 08/18/16 0351  WBC 5.4 4.9 5.2 5.5 4.5  HGB 7.1* 6.9* 6.7* 8.8* 8.8*  HCT 21.4* 20.2* 20.2* 25.4* 26.1*  MCV 91.8 91.8 91.0 88.8 89.1  PLT 68* 60* 64* 43* 56*   Cardiac Enzymes: No results for input(s): CKTOTAL, CKMB, CKMBINDEX, TROPONINI in the last 168 hours. CBG (last 3)  No results for input(s): GLUCAP in the last 72 hours. No results found for this or any previous visit (from the past 240 hour(s)).  Studies: No results found.  Scheduled Meds: . sodium chloride   Intravenous Once  . sodium chloride   Intravenous Once  . Chlorhexidine Gluconate Cloth  6 each Topical Daily  . darbepoetin (ARANESP) injection - NON-DIALYSIS  200 mcg Subcutaneous Q Fri-1800  . furosemide  20 mg Intravenous Once  . Gerhardt's butt cream   Topical TID  . lactulose  20 g Oral BID WC  . midodrine  10 mg Oral TID WC  . pantoprazole  40 mg Oral BID  . rifaximin  550 mg Oral BID  . sodium chloride flush  10-40 mL Intracatheter Q12H  . venlafaxine XR  75 mg Oral Daily   Continuous Infusions: . phenylephrine (NEO-SYNEPHRINE) Adult infusion 0 mcg/min (08/10/16 2000)   Active  Problems:   Liver cirrhosis secondary to NASH ([place])   Acute on chronic renal failure (HCC)   ARF (acute renal failure) (HCC)   Colitis   Hypertension   COPD (  chronic obstructive pulmonary disease) (HCC)   Nephrolithiasis   Depression   Thrombocytopenia (HCC)   Morbid obesity (HCC)   CKD (chronic kidney disease) stage 4, GFR 15-29 ml/min (HCC)   Hypotension   Acute renal failure (ARF) (HCC)   CHF (congestive heart failure) ([place])  Time spent:   Irwin Brakeman, MD, FAAFP [facility] Pager [phone]  If 7PM-7AM, please contact night-coverage [email or web address] Password [email or web address] 08/19/2016, 10:38 AM    LOS: 17 days

## 2016-08-20 DIAGNOSIS — K625 Hemorrhage of anus and rectum: Secondary | ICD-10-CM | POA: Diagnosis present

## 2016-08-20 LAB — CBC
HEMATOCRIT: 27 % — AB (ref 36.0–46.0)
HEMOGLOBIN: 8.9 g/dL — AB (ref 12.0–15.0)
MCH: 30.4 pg (ref 26.0–34.0)
MCHC: 33 g/dL (ref 30.0–36.0)
MCV: 92.2 fL (ref 78.0–100.0)
Platelets: 71 10*3/uL — ABNORMAL LOW (ref 150–400)
RBC: 2.93 MIL/uL — AB (ref 3.87–5.11)
RDW: 22.5 % — ABNORMAL HIGH (ref 11.5–15.5)
WBC: 5 10*3/uL (ref 4.0–10.5)

## 2016-08-20 LAB — RENAL FUNCTION PANEL
ALBUMIN: 2.8 g/dL — AB (ref 3.5–5.0)
ANION GAP: 8 (ref 5–15)
BUN: 20 mg/dL (ref 6–20)
CHLORIDE: 102 mmol/L (ref 101–111)
CO2: 24 mmol/L (ref 22–32)
Calcium: 8.6 mg/dL — ABNORMAL LOW (ref 8.9–10.3)
Creatinine, Ser: 4 mg/dL — ABNORMAL HIGH (ref 0.44–1.00)
GFR, EST AFRICAN AMERICAN: 13 mL/min — AB (ref >60)
GFR, EST NON AFRICAN AMERICAN: 11 mL/min — AB (ref >60)
Glucose, Bld: 72 mg/dL (ref 65–99)
PHOSPHORUS: 3.8 mg/dL (ref 2.5–4.6)
POTASSIUM: 4.2 mmol/L (ref 3.5–5.1)
Sodium: 134 mmol/L — ABNORMAL LOW (ref 135–145)

## 2016-08-20 LAB — PROTIME-INR
INR: 1.57
PROTHROMBIN TIME: 18.9 s — AB (ref 11.4–15.2)

## 2016-08-20 LAB — MAGNESIUM: MAGNESIUM: 2.4 mg/dL (ref 1.7–2.4)

## 2016-08-20 MED ORDER — SODIUM CHLORIDE 0.9 % IV SOLN: 100.0000 mL | INTRAVENOUS | Status: DC | PRN

## 2016-08-20 MED ORDER — LIDOCAINE HCL (PF) 1 % IJ SOLN: 5.0000 mL | INTRAMUSCULAR | Status: DC | PRN

## 2016-08-20 MED ORDER — ALTEPLASE 2 MG IJ SOLR: 2.0000 mg | Freq: Once | INTRAMUSCULAR | Status: DC | PRN

## 2016-08-20 MED ORDER — PENTAFLUOROPROP-TETRAFLUOROETH EX AERO: 1.0000 "application " | INHALATION_SPRAY | CUTANEOUS | Status: DC | PRN

## 2016-08-20 MED ORDER — LIDOCAINE-PRILOCAINE 2.5-2.5 % EX CREA: 1.0000 "application " | TOPICAL_CREAM | CUTANEOUS | Status: DC | PRN

## 2016-08-20 MED ORDER — HEPARIN SODIUM (PORCINE) 1000 UNIT/ML DIALYSIS: 1000.0000 [IU] | INTRAMUSCULAR | Status: DC | PRN

## 2016-08-20 MED ORDER — CEFAZOLIN SODIUM-DEXTROSE 2-4 GM/100ML-% IV SOLN
2.0000 g | Freq: Once | INTRAVENOUS | Status: DC
  Filled 2016-08-20: qty 100

## 2016-08-20 MED ORDER — CEFAZOLIN SODIUM-DEXTROSE 2-4 GM/100ML-% IV SOLN
2.0000 g | INTRAVENOUS | Status: AC
  Administered 2016-08-21: 2 g via INTRAVENOUS
  Filled 2016-08-20: qty 100

## 2016-08-20 NOTE — Consult Note (Signed)
Chief Complaint: Patient was seen in consultation today for tunneled hemodialysis catheter pacement at the request of Dr Laurena Bering  Referring Physician(s): Dr Laurena Bering  Supervising Physician: Jacqulynn Cadet  Patient Status: [facility] - In-pt  History of Present Illness: Loretta [name] is a 61 y.o. female   Oliguric AKI/CKD--ischemic ATN acute tubular necrosis Karlene Lineman Cirrhosis L GI bleed---no events in 24-36 hrs Admitted after finding elevated serum Cr in PMD office Recent tx for colitis plt 71 today  Request for tunneled dialysis catheter placement Had temp cath placed 08/04/16 CCM-- removed after last dialysis 4/13   Past Medical History:  Diagnosis Date  . CKD (chronic kidney disease) stage 4, GFR 15-29 ml/min (HCC) 01/2016  . COPD (chronic obstructive pulmonary disease) ([place])   . Depression   . Hypertension   . Liver cirrhosis secondary to NASH ([place])   . Morbid obesity ([place])   . Nephrolithiasis   . Thrombocytopenia ([place])     Past Surgical History:  Procedure Laterality Date  . CHOLECYSTECTOMY    . ESOPHAGOGASTRODUODENOSCOPY N/A 01/16/2016   Procedure: ESOPHAGOGASTRODUODENOSCOPY (EGD);  Surgeon: Clarene Essex, MD;  Location: [facility] ENDOSCOPY;  Service: Endoscopy;  Laterality: N/A;  . GASTRIC BYPASS      Allergies: Codeine; Cymbalta [duloxetine hcl]; Levaquin [levofloxacin in d5w]; and Tomato  Medications: Prior to Admission medications   Medication Sig Start Date End Date Taking? Authorizing Provider  docusate sodium (COLACE) 100 MG capsule Take 100 mg by mouth daily as needed for moderate constipation.    Yes Historical Provider, MD  folic acid (FOLVITE) 1 MG tablet Take 1 mg by mouth daily.   Yes Historical Provider, MD  furosemide (LASIX) 20 MG tablet Take 1 tablet (20 mg total) by mouth 2 (two) times daily. Restart in 5-6days after FU with PCP 01/21/16  Yes Domenic Polite, MD  KRISTALOSE 20 g packet Take 20 g by mouth 3 (three) times daily.  11/14/15  Yes Historical  Provider, MD  Melatonin 3 MG TABS Take 3 mg by mouth at bedtime.   Yes Historical Provider, MD  metolazone (ZAROXOLYN) 5 MG tablet Take 5 mg by mouth 3 (three) times a week. 07/23/16  Yes Historical Provider, MD  milk thistle 175 MG tablet Take 175 mg by mouth 2 (two) times daily.   Yes Historical Provider, MD  Multiple Vitamin (MULTI-VITAMIN PO) Take 1 tablet by mouth daily.   Yes Historical Provider, MD  nadolol (CORGARD) 40 MG tablet Take 40 mg by mouth 2 (two) times daily.  12/23/15  Yes Historical Provider, MD  pantoprazole (PROTONIX) 40 MG tablet Take 1 tablet (40 mg total) by mouth 2 (two) times daily before a meal. 01/21/16  Yes Domenic Polite, MD  pramipexole (MIRAPEX) 0.25 MG tablet Take 0.25 mg by mouth 2 (two) times daily. 11/18/15  Yes Historical Provider, MD  Probiotic Product (PROBIOTIC PO) Take 1 tablet by mouth daily.   Yes Historical Provider, MD  spironolactone (ALDACTONE) 50 MG tablet Take 1 tablet (50 mg total) by mouth daily. Restart in 5-6days after FU with PCP Patient taking differently: Take 25 mg by mouth daily. Restart in 5-6days after FU with PCP 01/21/16  Yes Domenic Polite, MD  venlafaxine XR (EFFEXOR-XR) 75 MG 24 hr capsule Take 75 mg by mouth daily with breakfast.   Yes Historical Provider, MD  VENTOLIN HFA 108 (90 Base) MCG/ACT inhaler Inhale 2 puffs into the lungs every 4 (four) hours as needed for wheezing or shortness of breath.  07/25/16  Yes  Historical Provider, MD  XIFAXAN 550 MG TABS tablet Take 550 mg by mouth 2 (two) times daily. 12/23/15  Yes Historical Provider, MD     Family History  Problem Relation Age of Onset  . Throat cancer Father   . Lung cancer Brother     Social History   Social History  . Marital status: Married    Spouse name: N/A  . Number of children: N/A  . Years of education: N/A   Social History Main Topics  . Smoking status: Former Research scientist (life sciences)  . Smokeless tobacco: Never Used  . Alcohol use No  . Drug use: No  . Sexual activity: Not  Asked   Other Topics Concern  . None   Social History Narrative  . None    Review of Systems: A 12 point ROS discussed and pertinent positives are indicated in the HPI above.  All other systems are negative.  Review of Systems  Constitutional: Positive for activity change and fatigue. Negative for fever.  Cardiovascular: Negative for chest pain.  Gastrointestinal: Negative for abdominal pain.  Genitourinary: Positive for decreased urine volume.  Neurological: Positive for weakness.  Psychiatric/Behavioral: Negative for behavioral problems and confusion.    Vital Signs: BP (!) 133/52 (BP Location: Left Arm)   Pulse 82   Temp 98.6 F (37 C) (Oral)   Resp 19   Ht '5\' 4"'$  (1.626 m)   Wt 266 lb 3.2 oz (120.7 kg)   SpO2 98%   BMI 45.69 kg/m   Physical Exam  Constitutional: She is oriented to person, place, and time.  obese  Cardiovascular: Normal rate, regular rhythm and normal heart sounds.   Pulmonary/Chest: Effort normal and breath sounds normal.  Abdominal: Soft. Bowel sounds are normal.  Musculoskeletal: Normal range of motion.  Neurological: She is alert and oriented to person, place, and time.  Skin: Skin is warm and dry.  Psychiatric: She has a normal mood and affect. Her behavior is normal. Judgment and thought content normal.    Mallampati Score:  MD Evaluation Airway: WNL Heart: WNL Abdomen: WNL Chest/ Lungs: WNL ASA  Classification: 3 Mallampati/Airway Score: One  Imaging: Dg Knee 1-2 Views Left  Result Date: 08/16/2016 CLINICAL DATA:  Recent fall, bilateral knee pain left greater than right EXAM: LEFT KNEE - 1-2 VIEW COMPARISON:  None. FINDINGS: Two views of the left knee submitted. No acute fracture or subluxation. Mild narrowing of medial joint compartment. No joint effusion. IMPRESSION: No acute fracture or subluxation. Mild narrowing of medial joint compartment. Electronically Signed   By: Lahoma Crocker M.D.   On: 08/16/2016 12:10   Dg Knee 1-2 Views  Right  Result Date: 08/16/2016 CLINICAL DATA:  Recent fall. Bilateral knee discomfort. No swelling or bruising. EXAM: RIGHT KNEE - 1-2 VIEW COMPARISON:  None. FINDINGS: There is no joint effusion. No fracture or subluxation identified. Sharpening of the tibial spines identified. There is mild medial and patellofemoral compartment narrowing. IMPRESSION: 1. No acute findings. 2. Osteoarthritis. Electronically Signed   By: Kerby Moors M.D.   On: 08/16/2016 12:19   Dg Chest Port 1 View  Result Date: 08/15/2016 CLINICAL DATA:  61 y/o  F; congestive heart failure. EXAM: PORTABLE CHEST 1 VIEW COMPARISON:  08/06/2016 chest radiograph FINDINGS: Stable cardiac silhouette. Right central venous catheter tip projects over upper SVC. Interstitial lung markings with improved aeration of lung bases. No airspace consolidation. No pleural effusion or pneumothorax. No acute osseous abnormality is evident. IMPRESSION: Interstitial lung markings probably representing interstitial pulmonary  edema. Improved aeration of lung bases. Electronically Signed   By: Kristine Garbe M.D.   On: 08/15/2016 18:26   Dg Chest Port 1 View  Result Date: 08/06/2016 CLINICAL DATA:  Acute renal failure EXAM: PORTABLE CHEST 1 VIEW COMPARISON:  08/04/2016 FINDINGS: Right jugular central venous catheter is been pulled back into the jugular vein. Progression of vascular congestion and bilateral airspace disease most compatible with mild edema. No effusion. IMPRESSION: Right jugular catheter has been pulled back with the tip now in the lower jugular vein Progression of bilateral airspace disease and vascular congestion most likely due to edema versus pneumonia. Electronically Signed   By: Franchot Gallo M.D.   On: 08/06/2016 07:14   Dg Chest Port 1 View  Result Date: 08/04/2016 CLINICAL DATA:  Encounter for central line placement, history hypertension, morbid obesity, COPD, chronic kidney disease EXAM: PORTABLE CHEST 1 VIEW COMPARISON:   Portable exam 1350 hours compared to 05/18/2016 FINDINGS: RIGHT jugular line with tip projecting over SVC. Borderline enlargement of cardiac silhouette. Mediastinal contours and pulmonary vascularity normal. Atelectasis versus infiltrate at medial RIGHT lung base. Remaining lungs clear. No pleural effusion or pneumothorax. Bones demineralized. IMPRESSION: No pneumothorax following central line placement. Atelectasis versus infiltrate in medial RIGHT lower lobe. Electronically Signed   By: Lavonia Dana M.D.   On: 08/04/2016 14:02    Labs:  CBC:  Recent Labs  08/16/16 1254 08/17/16 0522 08/18/16 0351 08/20/16 0440  WBC 5.2 5.5 4.5 5.0  HGB 6.7* 8.8* 8.8* 8.9*  HCT 20.2* 25.4* 26.1* 27.0*  PLT 64* 43* 56* 71*    COAGS:  Recent Labs  01/16/16 0002 01/16/16 1028 01/20/16 1456 08/17/16 0522  INR 2.02 1.74 1.45 1.51  APTT  --  35  --   --     BMP:  Recent Labs  08/17/16 0522 08/18/16 0351 08/19/16 0338 08/20/16 0440  NA 132* 130* 134* 134*  K 3.6 3.6 4.2 4.2  CL 98* 98* 102 102  CO2 '27 24 28 24  '$ GLUCOSE 73 81 79 72  BUN 14 21* 12 20  CALCIUM 8.4* 8.6* 8.4* 8.6*  CREATININE 3.75* 4.48* 3.07* 4.00*  GFRNONAA 12* 10* 16* 11*  GFRAA 14* 11* 18* 13*    LIVER FUNCTION TESTS:  Recent Labs  01/21/16 0537 08/02/16 1900 08/03/16 0548  08/16/16 1254 08/17/16 0522 08/18/16 0351 08/19/16 0338 08/20/16 0440  BILITOT 1.9* 1.2 1.3*  --  1.8*  --   --   --   --   AST 62* 31 29  --  55*  --   --   --   --   ALT 32 12* 14  --  19  --   --   --   --   ALKPHOS 82 83 80  --  106  --   --   --   --   PROT 4.7* 5.4* 5.2*  --  5.0*  --   --   --   --   ALBUMIN 2.3* 2.4* 2.6*  < > 2.8* 2.9* 2.9* 2.8* 2.8*  < > = values in this interval not displayed.  TUMOR MARKERS: No results for input(s): AFPTM, CEA, CA199, CHROMGRNA in the last 8760 hours.  Assessment and Plan:  Oliguric AKI/CKD Temp cath 3/31; now removed Worsening Cr; need for long term dialysis Scheduled for  tunneled dialysis catheter placement Risks and Benefits discussed with the patient including, but not limited to bleeding, infection, vascular injury, pneumothorax which may require chest tube  placement, air embolism or even death All of the patient's questions were answered, patient is agreeable to proceed. Consent signed and in chart.   Thank you for this interesting consult.  I greatly enjoyed meeting Loretta [name] and look forward to participating in their care.  A copy of this report was sent to the requesting provider on this date.  Electronically Signed: Monia Sabal A 08/20/2016, 11:32 AM   I spent a total of 20 Minutes    in face to face in clinical consultation, greater than 50% of which was counseling/coordinating care for tunneled HD catheter placement

## 2016-08-20 NOTE — Progress Notes (Signed)
  [facility] Progress Note    Assessment/ Plan:   1 Oliguric AKI///CKD 3-4 Presumably, ischemic ATN- hopefully can recover, none meaningful yet.  Have placed order for [facility] with IR. 2 Anemia esa/fe/PRBCs- improving. 3 DM 4 Massive obesity,NASH 5 Cirrhosis 6 L GI Bleed  Subjective:    No acute events overnight.  R IJ nontunneled catheter removed.  Feels like she is urinating more but hard to say as she stools as well.   Objective:   BP (!) 133/52 (BP Location: Left Arm)   Pulse 82   Temp 98.6 F (37 C) (Oral)   Resp 19   Ht '5\' 4"'$  (1.626 m)   Wt 120.7 kg (266 lb 3.2 oz)   SpO2 98%   BMI 45.69 kg/m   Intake/Output Summary (Last 24 hours) at 08/20/16 0959 Last data filed at 08/20/16 0944  Gross per 24 hour  Intake              970 ml  Output                0 ml  Net              970 ml   Weight change: -2.552 kg (-5 lb 10 oz)  Physical Exam: Gen:NAD CVS:RRR Resp:nonlabored Abd: obese AQT:MAUQJFHLKT bilateral edema  Imaging: No results found.  Labs: BMET  Recent Labs Lab 08/14/16 1954 08/15/16 0430 08/15/16 1610 08/16/16 1254 08/17/16 0522 08/18/16 0351 08/19/16 0338 08/20/16 0440  NA 133* 133* 134* 132* 132* 130* 134* 134*  K 4.1 4.1 4.1 4.1 3.6 3.6 4.2 4.2  CL 104 102 101 102 98* 98* 102 102  CO2 '24 24 22 23 27 24 28 24  '$ GLUCOSE 99 67 88 73 73 81 79 72  BUN 17 21* 24* 27* 14 21* 12 20  CREATININE 4.30* 4.51* 4.90* 5.17* 3.75* 4.48* 3.07* 4.00*  CALCIUM 8.7* 8.8* 9.1 8.5* 8.4* 8.6* 8.4* 8.6*  PHOS 3.6 3.8 3.7  --  3.7 3.8 3.4 3.8   CBC  Recent Labs Lab 08/16/16 1254 08/17/16 0522 08/18/16 0351 08/20/16 0440  WBC 5.2 5.5 4.5 5.0  HGB 6.7* 8.8* 8.8* 8.9*  HCT 20.2* 25.4* 26.1* 27.0*  MCV 91.0 88.8 89.1 92.2  PLT 64* 43* 56* 71*    Medications:    . sodium chloride   Intravenous Once  . sodium chloride   Intravenous Once  . Chlorhexidine Gluconate Cloth  6 each Topical Daily  . darbepoetin (ARANESP) injection -  NON-DIALYSIS  200 mcg Subcutaneous Q Fri-1800  . furosemide  20 mg Intravenous Once  . Gerhardt's butt cream   Topical TID  . lactulose  20 g Oral BID WC  . midodrine  10 mg Oral TID WC  . pantoprazole  40 mg Oral BID  . rifaximin  550 mg Oral BID  . sodium chloride flush  10-40 mL Intracatheter Q12H  . venlafaxine XR  75 mg Oral Daily      Madelon Lips, MD 08/20/2016, 9:59 AM

## 2016-08-20 NOTE — Progress Notes (Signed)
PROGRESS NOTE   Jazelle Achey  KAJ:[id number]  DOB: Aug 20, 1955  DOA: 08/02/2016 PCP: Cyndy Freeze, MD   Hospital course: HPI: Shaterria Sager is a 61 y.o. female with medical history significant of liver cirrhosis secondary to NASH, history of edema treated with Lasix. Presenting today after patient was found to have elevated serum creatinine level at her primary care's office. Patient was recently treated for colitis of which she was sent home with Cipro and Flagyl. She completed 12 out of the 14 days of treatment. She still has some abdominal discomfort. Otherwise patient reports no new complaints. Patient denies any fevers.  61 yo MO(273lbs) hx of gastric bypass, non acholic cirrhosis , acute on chronic renal dz, not a candidate for liver tx due to obesity, who is moved to ICU for HD and HD cath placement. PCCM asked to follow in ICU. She is awake and alert and in NAD. RIJ HD catheter was placed and HD to be instituted per renal.  Assessment & Plan:   Acute on chronic kidney disease Stage 3: Oliguric AKI, -Pt still dialysis dependent, nephrology ordered [facility] with IR for 4/16 -Continue midodrine -DC Phenylphrine, has not needed. -Nephrology following for HD needs -RIJ catheter removed 4/14 -Pt hopefully will be able to discharge in next days  COPD without acute exacerbation: Not on home therapy. PRN bronchodilators  OSA: CPAP as needed.  Hematochezia - GI consult appreciated. She was taken off subcut heparin and given vit K.  They are not planning colonoscopy at this time, continue supportive care.  Hg has been stable.      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 Undergoing [facility] workup at [facility], however not felt to be a candidate due to obesity at this time.    Chronic normocytic anemia: Baseline Hb 7-8 and has been stable post recent transfusion.   Fall - Xrays of both knees, no acute findings.    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  Pruritus: Loratadine, hydroxyzine as needed. Increase atarax to 25 mg TID prn.   Subjective: Pt really dislikes the renal diet but otherwise has some blood tinged stool but very faint amounts  Objective: Vitals:   08/19/16 0417 08/19/16 1437 08/19/16 2003 08/20/16 0653  BP: (!) 122/50 (!) 108/44 (!) 133/52   Pulse: 78 83 82   Resp: '19 18 19   '$ Temp: 98.3 F (36.8 C) 98.1 F (36.7 C) 98.6 F (37 C)   TempSrc: Oral Oral Oral   SpO2: 100% 97% 98%   Weight:    120.7 kg (266 lb 3.2 oz)  Height:        Intake/Output Summary (Last 24 hours) at 08/20/16 1131 Last data filed at 08/20/16 0944  Gross per 24 hour  Intake              970 ml  Output                0 ml  Net              970 ml   Filed Weights   08/18/16 0815 08/18/16 1223 08/20/16 0653  Weight: 123.3 kg (271 lb 13.2 oz) 120.8 kg (266 lb 5.1 oz) 120.7 kg (266 lb 3.2 oz)    Exam:  Constitutional: She is oriented to person, place, and time. No distress.  HENT: Head: Normocephalic and atraumatic.  Eyes: Conjunctivae are normal. No scleral icterus.  Neck: bandages from RIJ removed 4/14 Cardiovascular: Normal rate and regular rhythm.   Murmur best appreciated in the left lower sternal border.  Pulmonary/Chest: Effort normal. No respiratory distress.  Abdominal: Soft. She exhibits no distension.  Musculoskeletal: bilateral knee pain with palpation. She exhibits edema (1+ pitting edema in the bilateral lower extremities).  Neurological: She is alert and oriented to person, place, and time.  Skin: She is not diaphoretic.   Data Reviewed: Basic Metabolic Panel:  Recent Labs Lab 08/15/16 1610 08/16/16 0416 08/16/16 1254 08/17/16 0522 08/18/16 0351 08/19/16 0338 08/20/16 0440  NA 134*  --  132* 132* 130* 134* 134*  K 4.1  --  4.1 3.6 3.6 4.2 4.2  CL  101  --  102 98* 98* 102 102  CO2 22  --  '23 27 24 28 24  '$ GLUCOSE 88  --  73 73 81 79 72  BUN 24*  --  27* 14 21* 12 20  CREATININE 4.90*  --  5.17* 3.75* 4.48* 3.07* 4.00*  CALCIUM 9.1  --  8.5* 8.4* 8.6* 8.4* 8.6*  MG  --  2.8*  --  2.2 2.3 2.3 2.4  PHOS 3.7  --   --  3.7 3.8 3.4 3.8   Liver Function Tests:  Recent Labs Lab 08/16/16 1254 08/17/16 0522 08/18/16 0351 08/19/16 0338 08/20/16 0440  AST 55*  --   --   --   --   ALT 19  --   --   --   --   ALKPHOS 106  --   --   --   --   BILITOT 1.8*  --   --   --   --   PROT 5.0*  --   --   --   --   ALBUMIN 2.8* 2.9* 2.9* 2.8* 2.8*   No results for input(s): LIPASE, AMYLASE in the last 168 hours. No results for input(s): AMMONIA in the last 168 hours. CBC:  Recent Labs Lab 08/16/16 0416 08/16/16 1254 08/17/16 0522 08/18/16 0351 08/20/16 0440  WBC 4.9 5.2 5.5 4.5 5.0  HGB 6.9* 6.7* 8.8* 8.8* 8.9*  HCT 20.2* 20.2* 25.4* 26.1* 27.0*  MCV 91.8 91.0 88.8 89.1 92.2  PLT 60* 64* 43* 56* 71*   Cardiac Enzymes: No results for input(s): CKTOTAL, CKMB, CKMBINDEX, TROPONINI in the last 168 hours. CBG (last 3)  No results for input(s): GLUCAP in the last 72 hours. No results found for this or any previous visit (from the past 240 hour(s)).  Studies: No results found.  Scheduled Meds: . sodium chloride   Intravenous Once  . sodium chloride   Intravenous Once  . Chlorhexidine Gluconate Cloth  6 each Topical Daily  . darbepoetin (ARANESP) injection - NON-DIALYSIS  200 mcg Subcutaneous Q Fri-1800  . furosemide  20 mg Intravenous Once  . Gerhardt's butt cream   Topical TID  . lactulose  20 g Oral BID WC  . midodrine  10 mg Oral TID WC  . pantoprazole  40 mg Oral BID  . rifaximin  550 mg Oral BID  . sodium chloride flush  10-40 mL Intracatheter Q12H  . venlafaxine XR  75 mg Oral Daily   Continuous Infusions: . phenylephrine (NEO-SYNEPHRINE) Adult infusion 0 mcg/min (08/10/16 2000)   Active Problems:   Liver cirrhosis  secondary to NASH ([employer])   Acute on chronic renal failure (HCC)   ARF (acute renal failure) (HCC)   Colitis  Hypertension   COPD (chronic obstructive pulmonary disease) (HCC)   Nephrolithiasis   Depression   Thrombocytopenia (HCC)   Morbid obesity (HCC)   CKD (chronic kidney disease) stage 4, GFR 15-29 ml/min (HCC)   Hypotension   Acute renal failure (ARF) (HCC)   CHF (congestive heart failure) ([place])  Time spent:   Irwin Brakeman, MD, FAAFP [facility] Pager [phone]  If 7PM-7AM, please contact night-coverage [email or web address] Password [email or web address] 08/20/2016, 11:31 AM    LOS: 18 days

## 2016-08-20 NOTE — Progress Notes (Signed)
Progress Note   Subjective  Chief Complaint: LGIB   Patient found laying in bed this morning, she tells me nothing has changed a lot since yesterday, though she did have a 24 hour period yesterday where she did not experience any blood in her stool and she did have 2 loose stools yesterday.  This morning she had another loose stool and did see some brb in her stool, but only a small amt. Pt tells me she thinks this is hemorrhoids as she has noticed some corresponding rectal irritation. Pt tells me plans are for port placement tomorrow with surgical team.    Objective   Vital signs in last 24 hours: Temp:  [98.1 F (36.7 C)-98.6 F (37 C)] 98.6 F (37 C) (04/15 2003) Pulse Rate:  [82-83] 82 (04/15 2003) Resp:  [18-19] 19 (04/15 2003) BP: (108-133)/(44-52) 133/52 (04/15 2003) SpO2:  [97 %-98 %] 98 % (04/15 2003) Weight:  [266 lb 3.2 oz (120.7 kg)] 266 lb 3.2 oz (120.7 kg) (04/16 0653) Last BM Date: 08/19/16 General: Obese Caucasian female in NAD Heart:  Regular rate and rhythm; no murmurs Lungs: Respirations even and unlabored, lungs CTA bilaterally Abdomen:  Soft, Mod tenderness Left side of abd and nondistended. Normal bowel sounds. Extremities:  Without edema. Neurologic:  Alert and oriented,  grossly normal neurologically. Psych:  Cooperative. Normal mood and affect.  Intake/Output from previous day: 04/15 0701 - 04/16 0700 In: 1210 [P.O.:1200; I.V.:10] Out: -   Lab Results:  Recent Labs  08/18/16 0351 08/20/16 0440  WBC 4.5 5.0  HGB 8.8* 8.9*  HCT 26.1* 27.0*  PLT 56* 71*   BMET  Recent Labs  08/18/16 0351 08/19/16 0338 08/20/16 0440  NA 130* 134* 134*  K 3.6 4.2 4.2  CL 98* 102 102  CO2 '24 28 24  '$ GLUCOSE 81 79 72  BUN 21* 12 20  CREATININE 4.48* 3.07* 4.00*  CALCIUM 8.6* 8.4* 8.6*   LFT  Recent Labs  08/20/16 0440  ALBUMIN 2.8*    Assessment / Plan:   Assessment: 1. LGIB:continues to wax and wane with no bleeding for past 24 hrs until  this morning-small amt of brb with loose stool, endoscopic procedures have been on hold as pt is undergoing eval for other acute medical problems and her hgb has remained stable; rectal exam at time of consult showed small/medium hemorrhoid tags and no red blood 2. NASH cirrhosis 3. Morbid obesity 4. Oliguric kidney injury-on HD  Plan: 1. Continue supportive measures 2. Please await further recs from Dr. Loletha Carrow later today  Thank you for your kind consultation, we will continue to follow.   LOS: 18 days   Levin Erp  08/20/2016, 10:40 AM  Pager # [phone]   I have discussed the case with the PA, and that is the plan I formulated. I personally interviewed and examined the patient.  She continues to have small volume intermittent rectal bleeding.  It is most likely hemorrhoidal in nature.  Her low platelets and elevated INR as well as morbid obesity and cirrhosis would limit treatment options. She is high risk for colonoscopy due to BMI and multiple severe medical issues.  She seemed surprised when I said that, even though both Dr Ardis Hughs and Collene Mares felt the same.  She is getting a dialysis catheter placed tomorrow.  After that is done we will revisit this issue.  The most I would be willing to do is an unsedated flexible sigmoidoscopy.  Nelida Meuse III  Pager [phone]  Mon-Fri 8a-5p [phone] after 5p, weekends, holidays

## 2016-08-20 NOTE — Progress Notes (Signed)
Physical Therapy Treatment Patient Details Name: Loretta [name] MRN: [id number] DOB: 1955-07-04 Today's Date: 08/20/2016    History of Present Illness Pt adm with acute renal failure and CVVHD initiated on 4/1. CVVHD stopped 4/9. PMH - NASH cirrhosis, morbid obesity, gastric bypass    PT Comments    Pt is making good progress towards her goals. Pt is mod I for bed mobility, supervision for transfers to RW and min guard for ambulation of 620 ft with RW. Pt scheduled for surgery tomorrow and will follow up on 08/22/16 to confirm level of mobility with possibility of PT d/c to mobility tech. Pt currently requires skilled PT to progress level of mobility in ambulation and for LE strengthening and endurance to safely navigate her home environment.     Follow Up Recommendations  Home health PT     Equipment Recommendations  None recommended by PT       Precautions / Restrictions Precautions Precautions: Fall Restrictions Weight Bearing Restrictions: No    Mobility  Bed Mobility           Sit to supine: Modified independent (Device/Increase time)   General bed mobility comments: pt able to get into bed and scoot up to Hob with only bedrail assist  Transfers Overall transfer level: Needs assistance Equipment used: Rolling walker (2 wheeled) Transfers: Sit to/from Stand Sit to Stand: Supervision         General transfer comment: good hand placement to lower down to bed.   Ambulation/Gait Ambulation/Gait assistance: Min guard Ambulation Distance (Feet): 620 Feet Assistive device: Rolling walker (2 wheeled) Gait Pattern/deviations: Step-through pattern;Decreased step length - right;Decreased step length - left;Trunk flexed Gait velocity: slow Gait velocity interpretation: Below normal speed for age/gender General Gait Details: slow, steady ambulation able to look in both directions while ambulating with no LoB.       Balance Overall balance assessment: Needs  assistance Sitting-balance support: No upper extremity supported;Feet supported Sitting balance-Leahy Scale: Good     Standing balance support: Single extremity supported Standing balance-Leahy Scale: Good Standing balance comment: able to let go of RW and arrange sheets on the bed outside her BoS                            Cognition Arousal/Alertness: Awake/alert Behavior During Therapy: WFL for tasks assessed/performed Overall Cognitive Status: Within Functional Limits for tasks assessed                                        Exercises General Exercises - Lower Extremity Ankle Circles/Pumps: AROM;10 reps;Supine Heel Slides: AROM;Both;10 reps Hip ABduction/ADduction: AROM;Both;10 reps Straight Leg Raises: AROM;Both;10 reps;Supine        Pertinent Vitals/Pain Pain Assessment: Faces Faces Pain Scale: No hurt Pain Location: abdomen Pain Descriptors / Indicators: Aching;Sore Pain Intervention(s): Limited activity within patient's tolerance;Monitored during session  VSS     PT Goals (current goals can now be found in the care plan section) Acute Rehab PT Goals Patient Stated Goal: go home PT Goal Formulation: With patient Time For Goal Achievement: 08/22/16 Potential to Achieve Goals: Good Progress towards PT goals: Progressing toward goals    Frequency    Min 3X/week      PT Plan Current plan remains appropriate       End of Session Equipment Utilized During Treatment: Gait belt Activity Tolerance: Patient limited  by fatigue Patient left: with call bell/phone within reach;in chair;with family/visitor present Nurse Communication: Mobility status PT Visit Diagnosis: Muscle weakness (generalized) (M62.81);Difficulty in walking, not elsewhere classified (R26.2)     Time: 4782-9562 PT Time Calculation (min) (ACUTE ONLY): 12 min  Charges:  $Gait Training: 8-22 mins                    G Codes:       Loretta [name] B. Migdalia Dk PT,  DPT Acute Rehabilitation  [phone] Pager [phone]     [place] 08/20/2016, 3:07 PM

## 2016-08-20 NOTE — Progress Notes (Signed)
Patient stated that she had small amount of loose stool this evening but no traces of blood.

## 2016-08-21 ENCOUNTER — Encounter (HOSPITAL_COMMUNITY): Payer: Self-pay | Admitting: Interventional Radiology

## 2016-08-21 ENCOUNTER — Inpatient Hospital Stay (HOSPITAL_COMMUNITY): Payer: Medicare HMO

## 2016-08-21 DIAGNOSIS — K625 Hemorrhage of anus and rectum: Secondary | ICD-10-CM

## 2016-08-21 DIAGNOSIS — K7469 Other cirrhosis of liver: Secondary | ICD-10-CM

## 2016-08-21 HISTORY — PX: IR FLUORO GUIDE CV LINE LEFT: IMG2282

## 2016-08-21 HISTORY — PX: IR US GUIDE VASC ACCESS LEFT: IMG2389

## 2016-08-21 LAB — CBC
HCT: 27.6 % — ABNORMAL LOW (ref 36.0–46.0)
HEMOGLOBIN: 9.3 g/dL — AB (ref 12.0–15.0)
MCH: 31.5 pg (ref 26.0–34.0)
MCHC: 33.7 g/dL (ref 30.0–36.0)
MCV: 93.6 fL (ref 78.0–100.0)
PLATELETS: 70 10*3/uL — AB (ref 150–400)
RBC: 2.95 MIL/uL — ABNORMAL LOW (ref 3.87–5.11)
RDW: 23.3 % — ABNORMAL HIGH (ref 11.5–15.5)
WBC: 3.6 10*3/uL — ABNORMAL LOW (ref 4.0–10.5)

## 2016-08-21 LAB — RENAL FUNCTION PANEL
ANION GAP: 9 (ref 5–15)
Albumin: 2.7 g/dL — ABNORMAL LOW (ref 3.5–5.0)
BUN: 25 mg/dL — ABNORMAL HIGH (ref 6–20)
CALCIUM: 8.8 mg/dL — AB (ref 8.9–10.3)
CHLORIDE: 104 mmol/L (ref 101–111)
CO2: 21 mmol/L — ABNORMAL LOW (ref 22–32)
Creatinine, Ser: 4.21 mg/dL — ABNORMAL HIGH (ref 0.44–1.00)
GFR calc Af Amer: 12 mL/min — ABNORMAL LOW (ref >60)
GFR calc non Af Amer: 11 mL/min — ABNORMAL LOW (ref >60)
Glucose, Bld: 68 mg/dL (ref 65–99)
Phosphorus: 4.5 mg/dL (ref 2.5–4.6)
Potassium: 4.2 mmol/L (ref 3.5–5.1)
SODIUM: 134 mmol/L — AB (ref 135–145)

## 2016-08-21 MED ORDER — FENTANYL CITRATE (PF) 100 MCG/2ML IJ SOLN
INTRAMUSCULAR | Status: AC
  Filled 2016-08-21: qty 2

## 2016-08-21 MED ORDER — HEPARIN SODIUM (PORCINE) 1000 UNIT/ML IJ SOLN
INTRAMUSCULAR | Status: AC
  Administered 2016-08-21: 3800 [IU]
  Filled 2016-08-21: qty 1

## 2016-08-21 MED ORDER — LIDOCAINE HCL 1 % IJ SOLN
INTRAMUSCULAR | Status: AC | PRN
  Administered 2016-08-21: 10 mL

## 2016-08-21 MED ORDER — TRAMADOL HCL 50 MG PO TABS
ORAL_TABLET | ORAL | Status: AC
  Administered 2016-08-21: 50 mg via ORAL
  Filled 2016-08-21: qty 1

## 2016-08-21 MED ORDER — LIDOCAINE HCL 1 % IJ SOLN
INTRAMUSCULAR | Status: AC
  Filled 2016-08-21: qty 20

## 2016-08-21 MED ORDER — CEFAZOLIN SODIUM-DEXTROSE 2-4 GM/100ML-% IV SOLN
INTRAVENOUS | Status: AC
  Filled 2016-08-21: qty 100

## 2016-08-21 MED ORDER — MIDAZOLAM HCL 2 MG/2ML IJ SOLN
INTRAMUSCULAR | Status: AC | PRN
  Administered 2016-08-21: 1 mg via INTRAVENOUS

## 2016-08-21 MED ORDER — FENTANYL CITRATE (PF) 100 MCG/2ML IJ SOLN
INTRAMUSCULAR | Status: AC | PRN
  Administered 2016-08-21: 50 ug via INTRAVENOUS

## 2016-08-21 MED ORDER — MIDAZOLAM HCL 2 MG/2ML IJ SOLN
INTRAMUSCULAR | Status: AC
  Filled 2016-08-21: qty 2

## 2016-08-21 NOTE — Progress Notes (Signed)
    Progress Note   Subjective  Chief Complaint:Rectal Bleeding  Pt reports having 2-3 small loose BM since yesterday and has not seen any further bleeding. Continues with generalized abdominal discomfort. She is hungry as she has been NPO overnight for port placement today. She denies any new complaints.    Objective   Vital signs in last 24 hours: Temp:  [97.8 F (36.6 C)-98.1 F (36.7 C)] 97.8 F (36.6 C) (04/17 0541) Pulse Rate:  [73-83] 73 (04/17 0541) Resp:  [18-19] 18 (04/17 0541) BP: (121-140)/(50-58) 121/58 (04/17 0541) SpO2:  [95 %-96 %] 95 % (04/17 0541) Weight:  [265 lb 3.4 oz (120.3 kg)] 265 lb 3.4 oz (120.3 kg) (04/17 0500) Last BM Date: 08/20/16 General: Obese Caucasian female in NAD Heart:  Regular rate and rhythm; no murmurs Lungs: Respirations even and unlabored, lungs CTA bilaterally Abdomen:  Soft, mod tenderness left side of abdomen and nondistended. Normal bowel sounds. Extremities:  Without edema. Neurologic:  Alert and oriented,  grossly normal neurologically. Psych:  Cooperative. Normal mood and affect.  Intake/Output from previous day: 04/16 0701 - 04/17 0700 In: 480 [P.O.:480] Out: 0   Lab Results:  Recent Labs  08/20/16 0440 08/21/16 0436  WBC 5.0 3.6*  HGB 8.9* 9.3*  HCT 27.0* 27.6*  PLT 71* 70*   BMET  Recent Labs  08/19/16 0338 08/20/16 0440 08/21/16 0436  NA 134* 134* 134*  K 4.2 4.2 4.2  CL 102 102 104  CO2 28 24 21*  GLUCOSE 79 72 68  BUN 12 20 25*  CREATININE 3.07* 4.00* 4.21*  CALCIUM 8.4* 8.6* 8.8*   LFT  Recent Labs  08/21/16 0436  ALBUMIN 2.7*   PT/INR  Recent Labs  08/20/16 1227  LABPROT 18.9*  INR 1.57     Assessment / Plan:   Assessment: 1. LGIB: very small volume, continues to wax and wane, though hgb continues to trend upward from 8.9 up to 9.3 this morning-likely represents hemorrhoidal bleeding 2. NASH Cirrhosis 3. Morbid Obesity 4. Oliguric kidney injury  Plan: 1. See Dr. Corena Pilgrim  comments yesterday, at this time pts hgb is improving and she only continues with small volume rectal bleeding "off and on", thought likely from hemorrhoids. Will discuss with Dr. Loletha Carrow, but likely we will sign off. Please await final recommendations from him later this afternoon.  Thank you for your kind consultation.   LOS: 19 days   Levin Erp  08/21/2016, 9:57 AM  Pager # [phone]  I have discussed the case with the PA.  We will see her tomorrow and see if she is agreeable to unsedated sigmoidoscopy to evaluate bleeding.   Nelida Meuse III Pager [phone]  Mon-Fri 8a-5p [phone] after 5p, weekends, holidays

## 2016-08-21 NOTE — Progress Notes (Signed)
Physical Therapy Treatment Patient Details Name: Loretta [name] MRN: [id number] DOB: 1956-04-20 Today's Date: 08/21/2016    History of Present Illness Pt adm with acute renal failure and CVVHD initiated on 4/1. CVVHD stopped 4/9. PMH - NASH cirrhosis, morbid obesity, gastric bypass    PT Comments    Pt performed decreased gait distance secondary to fatigue.  Pt sleeping on arrival and resistive to participating in session.  Pt agreeable to short gait distance.  Pt to go for dailysis port placement later today.  Plan for next session gait without device and issuing HEP in standing.     Follow Up Recommendations  Home health PT     Equipment Recommendations  None recommended by PT    Recommendations for Other Services       Precautions / Restrictions Precautions Precautions: Fall Restrictions Weight Bearing Restrictions: No    Mobility  Bed Mobility Overal bed mobility: Modified Independent       Supine to sit: Modified independent (Device/Increase time) Sit to supine: Modified independent (Device/Increase time)   General bed mobility comments: No assistance needed postive use of rail for trunk elevation.   Transfers Overall transfer level: Needs assistance Equipment used: Rolling walker (2 wheeled) Transfers: Sit to/from Stand Sit to Stand: Modified independent (Device/Increase time) Stand pivot transfers: Modified independent (Device/Increase time)       General transfer comment: No assistance needed good technique observed.    Ambulation/Gait Ambulation/Gait assistance: Supervision Ambulation Distance (Feet): 200 Feet Assistive device: Rolling walker (2 wheeled) Gait Pattern/deviations: Step-through pattern;Decreased stride length;Trunk flexed Gait velocity: slow.     General Gait Details: Cues for upper trunk control and continuation of step through pattern.     Stairs            Wheelchair Mobility    Modified Rankin (Stroke Patients Only)        Balance Overall balance assessment: Needs assistance Sitting-balance support: No upper extremity supported;Feet supported Sitting balance-Leahy Scale: Good       Standing balance-Leahy Scale: Good                              Cognition Arousal/Alertness: Awake/alert Behavior During Therapy: WFL for tasks assessed/performed Overall Cognitive Status: Within Functional Limits for tasks assessed                                        Exercises      General Comments        Pertinent Vitals/Pain Pain Assessment: Faces Faces Pain Scale: Hurts little more Pain Location: R flank Pain Descriptors / Indicators: Aching;Sore Pain Intervention(s): Monitored during session;Repositioned    Home Living                      Prior Function            PT Goals (current goals can now be found in the care plan section) Acute Rehab PT Goals Patient Stated Goal: go home Potential to Achieve Goals: Good Progress towards PT goals: Progressing toward goals    Frequency    Min 3X/week      PT Plan Current plan remains appropriate    Co-evaluation             End of Session   Activity Tolerance: Patient limited by fatigue Patient left: with call bell/phone  within reach;in chair;with family/visitor present Nurse Communication: Mobility status PT Visit Diagnosis: Muscle weakness (generalized) (M62.81);Difficulty in walking, not elsewhere classified (R26.2)     Time: 1000-1012 PT Time Calculation (min) (ACUTE ONLY): 12 min  Charges:  $Gait Training: 8-22 mins                    G Codes:       Governor Rooks, PTA pager [place] 08/21/2016, 10:16 AM

## 2016-08-21 NOTE — Procedures (Signed)
LIJV HD catheter SVC RA EBL 0 Comp 0

## 2016-08-21 NOTE — Progress Notes (Signed)
PROGRESS NOTE   Loretta [name]  PTW:[id number]  DOB: December 26, 1955  DOA: 08/02/2016 PCP: Cyndy Freeze, MD   Hospital course: HPI: Loretta [name] is a 61 y.o. female with medical history significant of liver cirrhosis secondary to NASH, history of edema treated with Lasix. Presenting today after patient was found to have elevated serum creatinine level at her primary care's office. Patient was recently treated for colitis of which she was sent home with Cipro and Flagyl. She completed 12 out of the 14 days of treatment. She still has some abdominal discomfort. Otherwise patient reports no new complaints. Patient denies any fevers.  61 yo MO(273lbs) hx of gastric bypass, non acholic cirrhosis , acute on chronic renal dz, not a candidate for liver tx due to obesity, who is moved to ICU for HD and HD cath placement. PCCM asked to follow in ICU. She is awake and alert and in NAD. RIJ HD catheter was initially placed and HD started but with slow renal recovery patient is going to require longer term HD, Tunneled HD cath was placed on 4/17.   Assessment & Plan:   Acute on chronic kidney disease Stage 3: Oliguric AKI, -Pt still dialysis dependent, nephrology ordered [facility] with IR for 4/16 -Continue midodrine -DC Phenylphrine, has not needed. -Nephrology following for HD needs -RIJ catheter removed 4/14 -Tunneled HD cath placed 4/17  COPD without acute exacerbation: Not on home therapy. PRN bronchodilators  OSA: CPAP as needed.  Hematochezia - GI consult appreciated. She was taken off subcut heparin and given vit K.  They are not planning colonoscopy at this time, continue supportive care.  Hg has been stable to improved.      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 Undergoing [facility] workup at [facility], however not felt to be a candidate due to obesity at this time.    Chronic normocytic anemia: Baseline Hb 8 and has been stable post recent  transfusion.   Fall in hospital - Xrays of both knees, no acute findings.  PT following, recommending HHPT  Thrombocytopenia: in setting of CLD - Platelets slowly rising, off heparin  Depression and anxiety: Continue venlafaxine and tramadol  Headache: Continue tramadol and Flexeril.  Pruritus: Loratadine, hydroxyzine as needed. Increase atarax to 25 mg TID prn.   Subjective: Pt resting but prepared for procedure today  Objective: Vitals:   08/20/16 1403 08/20/16 2114 08/21/16 0500 08/21/16 0541  BP: (!) 140/50 (!) 134/50  (!) 121/58  Pulse: 83 79  73  Resp: '19 18  18  '$ Temp: 98.1 F (36.7 C) 98.1 F (36.7 C)  97.8 F (36.6 C)  TempSrc: Oral Oral  Oral  SpO2: 96% 96%  95%  Weight:   120.3 kg (265 lb 3.4 oz)   Height:        Intake/Output Summary (Last 24 hours) at 08/21/16 0725 Last data filed at 08/21/16 0544  Gross per 24 hour  Intake              480 ml  Output                0 ml  Net              480 ml   Filed Weights   08/18/16 1223 08/20/16 0653 08/21/16 0500  Weight: 120.8 kg (266 lb 5.1 oz) 120.7 kg (266 lb 3.2 oz) 120.3 kg (265 lb 3.4 oz)    Exam:  Constitutional: She is oriented to person, place, and  time. No distress.  HENT: Head: Normocephalic and atraumatic.  Eyes: Conjunctivae are normal. No scleral icterus.  Neck: bandages from RIJ removed 4/14 clean and dry Cardiovascular: Normal rate and regular rhythm.   Murmur best appreciated in the left lower sternal border.  Pulmonary/Chest: Effort normal. No respiratory distress.  Abdominal: Soft. She exhibits no distension.  Musculoskeletal: bilateral knee pain with palpation. She exhibits edema (1+ pitting edema in the bilateral lower extremities).  Neurological: She is alert and oriented to person, place, and time.  Skin: She is not diaphoretic.   Data Reviewed: Basic Metabolic Panel:  Recent Labs Lab 08/16/16 0416  08/17/16 0522 08/18/16 0351 08/19/16 0338 08/20/16 0440 08/21/16 0436    NA  --   < > 132* 130* 134* 134* 134*  K  --   < > 3.6 3.6 4.2 4.2 4.2  CL  --   < > 98* 98* 102 102 104  CO2  --   < > '27 24 28 24 '$ 21*  GLUCOSE  --   < > 73 81 79 72 68  BUN  --   < > 14 21* 12 20 25*  CREATININE  --   < > 3.75* 4.48* 3.07* 4.00* 4.21*  CALCIUM  --   < > 8.4* 8.6* 8.4* 8.6* 8.8*  MG 2.8*  --  2.2 2.3 2.3 2.4  --   PHOS  --   --  3.7 3.8 3.4 3.8 4.5  < > = values in this interval not displayed. Liver Function Tests:  Recent Labs Lab 08/16/16 1254 08/17/16 0522 08/18/16 0351 08/19/16 0338 08/20/16 0440 08/21/16 0436  AST 55*  --   --   --   --   --   ALT 19  --   --   --   --   --   ALKPHOS 106  --   --   --   --   --   BILITOT 1.8*  --   --   --   --   --   PROT 5.0*  --   --   --   --   --   ALBUMIN 2.8* 2.9* 2.9* 2.8* 2.8* 2.7*   No results for input(s): LIPASE, AMYLASE in the last 168 hours. No results for input(s): AMMONIA in the last 168 hours. CBC:  Recent Labs Lab 08/16/16 1254 08/17/16 0522 08/18/16 0351 08/20/16 0440 08/21/16 0436  WBC 5.2 5.5 4.5 5.0 3.6*  HGB 6.7* 8.8* 8.8* 8.9* 9.3*  HCT 20.2* 25.4* 26.1* 27.0* 27.6*  MCV 91.0 88.8 89.1 92.2 93.6  PLT 64* 43* 56* 71* 70*   Cardiac Enzymes: No results for input(s): CKTOTAL, CKMB, CKMBINDEX, TROPONINI in the last 168 hours. CBG (last 3)  No results for input(s): GLUCAP in the last 72 hours. No results found for this or any previous visit (from the past 240 hour(s)).  Studies: No results found.  Scheduled Meds: . sodium chloride   Intravenous Once  . sodium chloride   Intravenous Once  .  ceFAZolin (ANCEF) IV  2 g Intravenous To SSTC  . Chlorhexidine Gluconate Cloth  6 each Topical Daily  . darbepoetin (ARANESP) injection - NON-DIALYSIS  200 mcg Subcutaneous Q Fri-1800  . furosemide  20 mg Intravenous Once  . Gerhardt's butt cream   Topical TID  . lactulose  20 g Oral BID WC  . midodrine  10 mg Oral TID WC  . pantoprazole  40 mg Oral BID  . rifaximin  550 mg Oral BID  .  sodium chloride flush  10-40 mL Intracatheter Q12H  . venlafaxine XR  75 mg Oral Daily   Continuous Infusions: . phenylephrine (NEO-SYNEPHRINE) Adult infusion 0 mcg/min (08/10/16 2000)   Active Problems:   Other cirrhosis of liver (HCC)   Acute on chronic renal failure (HCC)   ARF (acute renal failure) (HCC)   Colitis   Hypertension   COPD (chronic obstructive pulmonary disease) (HCC)   Nephrolithiasis   Depression   Thrombocytopenia (HCC)   Morbid obesity (HCC)   CKD (chronic kidney disease) stage 4, GFR 15-29 ml/min (HCC)   Hypotension   Acute renal failure (ARF) (HCC)   CHF (congestive heart failure) ([place])   Rectal bleeding  Time spent:   Irwin Brakeman, MD, FAAFP [facility] Pager [phone]  If 7PM-7AM, please contact night-coverage [email or web address] Password [email or web address] 08/21/2016, 7:25 AM    LOS: 19 days

## 2016-08-21 NOTE — Progress Notes (Signed)
  [facility] Progress Note    Assessment/ Plan:   1. Oliguric AKI on CKD 3-4- thought to be secondary to ischemic ATN - Plan for IR to place [facility] - HD after [facility] is placed today - Strict I/O 2. Anemia- improving. Hgb 8.9 > 9.3. - Continue Arasnesp - Check CBC in the morning 3. T2DM- well-controlled - Per primary team 4. Massive obesity,NASH 5. Cirrhosis 6. L GI Bleed  Subjective:    Patient states she urinated 3 times overnight. She is unsure how much she is urinating. She states she is hungry this morning. She is looking forward to eating after the catheter is placed.   Objective:   BP (!) 121/58 (BP Location: Left Arm)   Pulse 73   Temp 97.8 F (36.6 C) (Oral)   Resp 18   Ht '5\' 4"'$  (1.626 m)   Wt 265 lb 3.4 oz (120.3 kg)   SpO2 95%   BMI 45.52 kg/m   Intake/Output Summary (Last 24 hours) at 08/21/16 1317 Last data filed at 08/21/16 1002  Gross per 24 hour  Intake              240 ml  Output                0 ml  Net              240 ml   Weight change: -15.8 oz (-0.448 kg)  Physical Exam: Gen: Laying in bed in NAD, pleasant CVS: RRR, no murmurs Resp: Normal work of breathing, CTAB Abd: +BS, soft, non-tender, non-distended Ext: minimal bilateral non-pitting lower extremity edema  Imaging: No results found.  Labs: BMET  Recent Labs Lab 08/15/16 0430 08/15/16 1610 08/16/16 1254 08/17/16 0522 08/18/16 0351 08/19/16 0338 08/20/16 0440 08/21/16 0436  NA 133* 134* 132* 132* 130* 134* 134* 134*  K 4.1 4.1 4.1 3.6 3.6 4.2 4.2 4.2  CL 102 101 102 98* 98* 102 102 104  CO2 '24 22 23 27 24 28 24 '$ 21*  GLUCOSE 67 88 73 73 81 79 72 68  BUN 21* 24* 27* 14 21* 12 20 25*  CREATININE 4.51* 4.90* 5.17* 3.75* 4.48* 3.07* 4.00* 4.21*  CALCIUM 8.8* 9.1 8.5* 8.4* 8.6* 8.4* 8.6* 8.8*  PHOS 3.8 3.7  --  3.7 3.8 3.4 3.8 4.5   CBC  Recent Labs Lab 08/17/16 0522 08/18/16 0351 08/20/16 0440 08/21/16 0436  WBC 5.5 4.5 5.0 3.6*  HGB 8.8* 8.8* 8.9* 9.3*   HCT 25.4* 26.1* 27.0* 27.6*  MCV 88.8 89.1 92.2 93.6  PLT 43* 56* 71* 70*    Medications:    . darbepoetin (ARANESP) injection - NON-DIALYSIS  200 mcg Subcutaneous Q Fri-1800  . furosemide  20 mg Intravenous Once  . Gerhardt's butt cream   Topical TID  . lactulose  20 g Oral BID WC  . midodrine  10 mg Oral TID WC  . pantoprazole  40 mg Oral BID  . rifaximin  550 mg Oral BID  . sodium chloride flush  10-40 mL Intracatheter Q12H  . venlafaxine XR  75 mg Oral Daily      Hyman Bible, MD PGY-2 Family Medicine Resident 08/21/2016, 1:17 PM

## 2016-08-22 DIAGNOSIS — N183 Chronic kidney disease, stage 3 (moderate): Secondary | ICD-10-CM

## 2016-08-22 LAB — CBC
HEMATOCRIT: 31.9 % — AB (ref 36.0–46.0)
Hemoglobin: 10.5 g/dL — ABNORMAL LOW (ref 12.0–15.0)
MCH: 30.6 pg (ref 26.0–34.0)
MCHC: 32.9 g/dL (ref 30.0–36.0)
MCV: 93 fL (ref 78.0–100.0)
PLATELETS: 72 10*3/uL — AB (ref 150–400)
RBC: 3.43 MIL/uL — ABNORMAL LOW (ref 3.87–5.11)
RDW: 23.9 % — AB (ref 11.5–15.5)
WBC: 4.6 10*3/uL (ref 4.0–10.5)

## 2016-08-22 LAB — RENAL FUNCTION PANEL
Albumin: 3.1 g/dL — ABNORMAL LOW (ref 3.5–5.0)
Anion gap: 9 (ref 5–15)
BUN: 8 mg/dL (ref 6–20)
CHLORIDE: 97 mmol/L — AB (ref 101–111)
CO2: 28 mmol/L (ref 22–32)
CREATININE: 2.23 mg/dL — AB (ref 0.44–1.00)
Calcium: 8.3 mg/dL — ABNORMAL LOW (ref 8.9–10.3)
GFR calc Af Amer: 27 mL/min — ABNORMAL LOW (ref >60)
GFR calc non Af Amer: 23 mL/min — ABNORMAL LOW (ref >60)
GLUCOSE: 75 mg/dL (ref 65–99)
POTASSIUM: 3.5 mmol/L (ref 3.5–5.1)
Phosphorus: 2.8 mg/dL (ref 2.5–4.6)
Sodium: 134 mmol/L — ABNORMAL LOW (ref 135–145)

## 2016-08-22 NOTE — Progress Notes (Signed)
[facility] PROGRESS NOTE  Laveda Demedeiros DTO:[id number] DOB: 1955-12-17 DOA: 08/02/2016 PCP: Cyndy Freeze, MD   Hospital course: HPI: Juliane Guest a 61 y.o.femalewith medical history significant of liver cirrhosis secondary to NASH, history of edema treated with Lasix. Presenting today after patient was found to have elevated serum creatinine level at her primary care's office. Patient was recently treated for colitis of which she was sent home with Cipro and Flagyl. She completed 12 out of the 14 days of treatment. She still has some abdominal discomfort. Otherwise patient reports no new complaints. Patient denies any fevers.  61 yo MO(273lbs) hx of gastric bypass, non acholic cirrhosis , acute on chronic renal dz, not a candidate for liver tx due to obesity, who is moved to ICU for HD and HD cath placement. PCCM asked to follow in ICU. She is awake and alert and in NAD. RIJ HD catheter was initially placed and HD started but with slow renal recovery patient is going to require longer term HD, Tunneled HD cath was placed on 4/17.      Assessment/Plan:  Acute on chronic kidney disease Stage 3: Oliguric AKI, -Pt still dialysis dependent, nephrology ordered [facility] with IR for 4/16 -Continue midodrine -DC Phenylphrine, has not needed. -Nephrology following for HD needs -RIJ catheter removed 4/14 -Tunneled HD cath placed 4/17  COPD without acute exacerbation: Not on home therapy. PRN bronchodilators  OSA: CPAP as needed.  Hematochezia/LGIB - GI consult appreciated. Likely hemorrhoidal in nature. Bleeding improving. Hemoglobin currently at 10.5. She was taken off subcut heparin and given vit K.  They are not planning colonoscopy at this time and possible flexible sigmoidoscopy, continue supportive care.  Hg has been stable to improved.   follow H&H.     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 Undergoing transplant workup at [facility],  however not felt to be a candidate due to obesity at this time.    Chronic normocytic anemia: Baseline Hb 8 and has been stable post recent transfusion. Hemoglobin currently at 10.5.  Fall in hospital - Xrays of both knees, no acute findings.  PT following, recommending HHPT  Thrombocytopenia: in setting of CLD - Platelets slowly rising, off heparin  Depression and anxiety: Continue venlafaxine and tramadol  Headache: Continue tramadol and Flexeril.  Pruritus: Loratadine, hydroxyzine as needed. Increased atarax to 25 mg TID prn.    Code Status: Full Family Communication: Updated patient. No family at bedside. Disposition Plan: Home when improved clinically, renal function back to baseline and per general surgery and gastroenterology.   Consultants:  Nephrology: Dr. Myna Hidalgo 08/02/2016  PCCM: Dr. Chase Caller 08/04/2016  Gastroenterology: Dr. Ardis Hughs 08/16/2016  Interventional radiology Dr. Geroge Baseman 08/20/2016  Procedures:  Plain films of the knees 08/16/2016  Chest x-ray 08/04/2016, 08/06/2016, 08/15/2016  Antibiotics:  IV ciprofloxacin 08/05/2016>>> 08/06/2016  Oral Flagyl  08/05/2016>>>>> 08/06/2016    HPI/Subjective: Patient sitting up in chair eating lunch. Patient denies any shortness of breath. No chest pain. Patient states she's getting better. Poor urinary output. Patient states yesterday had brown stools however today had brown stool as well as a bloody bowel movement. Patient also states when she wipes on the toilet paper notices some blood.  Objective: Vitals:   08/22/16 0527 08/22/16 1321  BP: (!) 111/42 (!) 144/55  Pulse: 91 79  Resp: 16   Temp: 99.1 F (37.3 C) 98.2 F (36.8 C)    Intake/Output Summary (Last 24 hours) at 08/22/16 1836 Last data filed at 08/22/16 1100  Gross per 24 hour  Intake              926 ml  Output             2500 ml  Net            -1574 ml   Filed Weights   08/21/16 1951 08/21/16 2354 08/22/16 0527  Weight:  119.9 kg (264 lb 5.3 oz) 117.2 kg (258 lb 6.1 oz) 117.6 kg (259 lb 3.2 oz)    Exam:   General:  NAD  Cardiovascular: RRR  Respiratory: CTAB  Abdomen: Soft, nontender, nondistended, positive bowel sounds.  Musculoskeletal: No clubbing cyanosis or edema.  Data Reviewed: Basic Metabolic Panel:  Recent Labs Lab 08/16/16 0416  08/17/16 0522 08/18/16 0351 08/19/16 8099 08/20/16 0440 08/21/16 0436 08/22/16 0247  NA  --   < > 132* 130* 134* 134* 134* 134*  K  --   < > 3.6 3.6 4.2 4.2 4.2 3.5  CL  --   < > 98* 98* 102 102 104 97*  CO2  --   < > '27 24 28 24 '$ 21* 28  GLUCOSE  --   < > 73 81 79 72 68 75  BUN  --   < > 14 21* 12 20 25* 8  CREATININE  --   < > 3.75* 4.48* 3.07* 4.00* 4.21* 2.23*  CALCIUM  --   < > 8.4* 8.6* 8.4* 8.6* 8.8* 8.3*  MG 2.8*  --  2.2 2.3 2.3 2.4  --   --   PHOS  --   < > 3.7 3.8 3.4 3.8 4.5 2.8  < > = values in this interval not displayed. Liver Function Tests:  Recent Labs Lab 08/16/16 1254  08/18/16 0351 08/19/16 0338 08/20/16 0440 08/21/16 0436 08/22/16 0247  AST 55*  --   --   --   --   --   --   ALT 19  --   --   --   --   --   --   ALKPHOS 106  --   --   --   --   --   --   BILITOT 1.8*  --   --   --   --   --   --   PROT 5.0*  --   --   --   --   --   --   ALBUMIN 2.8*  < > 2.9* 2.8* 2.8* 2.7* 3.1*  < > = values in this interval not displayed. No results for input(s): LIPASE, AMYLASE in the last 168 hours. No results for input(s): AMMONIA in the last 168 hours. CBC:  Recent Labs Lab 08/17/16 0522 08/18/16 0351 08/20/16 0440 08/21/16 0436 08/22/16 0247  WBC 5.5 4.5 5.0 3.6* 4.6  HGB 8.8* 8.8* 8.9* 9.3* 10.5*  HCT 25.4* 26.1* 27.0* 27.6* 31.9*  MCV 88.8 89.1 92.2 93.6 93.0  PLT 43* 56* 71* 70* 72*   Cardiac Enzymes: No results for input(s): CKTOTAL, CKMB, CKMBINDEX, TROPONINI in the last 168 hours. BNP (last 3 results) No results for input(s): BNP in the last 8760 hours.  ProBNP (last 3 results) No results for input(s):  PROBNP in the last 8760 hours.  CBG: No results for input(s): GLUCAP in the last 168 hours.  No results found for this or any previous visit (from the past 240 hour(s)).   Studies: Ir Fluoro Guide Cv Line Left  Result Date: 08/21/2016 INDICATION: Renal failure EXAM:  TUNNELED DIALYSIS CATHETER PLACEMENT, ULTRASOUND GUIDANCE FOR VASCULAR ACCESS MEDICATIONS: Ancef; The antibiotic was administered within an appropriate time interval prior to skin puncture. ANESTHESIA/SEDATION: Versed 1 mg IV; Fentanyl 50 mcg IV; Moderate Sedation Time:  15 The patient was continuously monitored during the procedure by the interventional radiology nurse under my direct supervision. FLUOROSCOPY TIME:  Fluoroscopy Time:  minutes 24 seconds (4 mGy). COMPLICATIONS: None immediate. PROCEDURE: Informed written consent was obtained from the patient after a thorough discussion of the procedural risks, benefits and alternatives. All questions were addressed. Maximal Sterile Barrier Technique was utilized including caps, mask, sterile gowns, sterile gloves, sterile drape, hand hygiene and skin antiseptic. A timeout was performed prior to the initiation of the procedure. The left neck was prepped with ChloraPrep in a sterile fashion, and a sterile drape was applied covering the operative field. A sterile gown and sterile gloves were used for the procedure. 1% lidocaine into the skin and subcutaneous tissue. The left jugular vein was noted to be patent initially with ultrasound. Under sonographic guidance, a micropuncture needle was inserted into the left IJ vein (Ultrasound and fluoroscopic image documentation was performed). It was removed over an 018 wire which was up-sized to an Amplatz. This was advanced into the IVC. A small incision was made in the right upper chest. The tunneling device was utilized to advance the 23 centimeter tip to cuff catheter from the chest incision and out the neck incision. A peel-away sheath was advanced  over the Amplatz wire. The leading edge of the catheter was then advanced through the peel-away sheath. The peel-away sheath was removed. It was flushed and instilled with heparin. The chest incision was closed with a 0 Prolene pursestring stitch. The neck incision was closed with a 4-0 Vicryl subcuticular stitch. IMPRESSION: Successful left IJ vein tunneled dialysis catheter with its tip in the right atrium. Electronically Signed   By: Marybelle Killings M.D.   On: 08/21/2016 15:45   Ir US Guide Vasc Access Left  Result Date: 08/21/2016 INDICATION: Renal failure EXAM: TUNNELED DIALYSIS CATHETER PLACEMENT, ULTRASOUND GUIDANCE FOR VASCULAR ACCESS MEDICATIONS: Ancef; The antibiotic was administered within an appropriate time interval prior to skin puncture. ANESTHESIA/SEDATION: Versed 1 mg IV; Fentanyl 50 mcg IV; Moderate Sedation Time:  15 The patient was continuously monitored during the procedure by the interventional radiology nurse under my direct supervision. FLUOROSCOPY TIME:  Fluoroscopy Time:  minutes 24 seconds (4 mGy). COMPLICATIONS: None immediate. PROCEDURE: Informed written consent was obtained from the patient after a thorough discussion of the procedural risks, benefits and alternatives. All questions were addressed. Maximal Sterile Barrier Technique was utilized including caps, mask, sterile gowns, sterile gloves, sterile drape, hand hygiene and skin antiseptic. A timeout was performed prior to the initiation of the procedure. The left neck was prepped with ChloraPrep in a sterile fashion, and a sterile drape was applied covering the operative field. A sterile gown and sterile gloves were used for the procedure. 1% lidocaine into the skin and subcutaneous tissue. The left jugular vein was noted to be patent initially with ultrasound. Under sonographic guidance, a micropuncture needle was inserted into the left IJ vein (Ultrasound and fluoroscopic image documentation was performed). It was removed over  an 018 wire which was up-sized to an Amplatz. This was advanced into the IVC. A small incision was made in the right upper chest. The tunneling device was utilized to advance the 23 centimeter tip to cuff catheter from the chest incision and out the neck incision. A  peel-away sheath was advanced over the Amplatz wire. The leading edge of the catheter was then advanced through the peel-away sheath. The peel-away sheath was removed. It was flushed and instilled with heparin. The chest incision was closed with a 0 Prolene pursestring stitch. The neck incision was closed with a 4-0 Vicryl subcuticular stitch. IMPRESSION: Successful left IJ vein tunneled dialysis catheter with its tip in the right atrium. Electronically Signed   By: Marybelle Killings M.D.   On: 08/21/2016 15:45    Scheduled Meds: . darbepoetin (ARANESP) injection - NON-DIALYSIS  200 mcg Subcutaneous Q Fri-1800  . furosemide  20 mg Intravenous Once  . Gerhardt's butt cream   Topical TID  . lactulose  20 g Oral BID WC  . midodrine  10 mg Oral TID WC  . pantoprazole  40 mg Oral BID  . rifaximin  550 mg Oral BID  . sodium chloride flush  10-40 mL Intracatheter Q12H  . venlafaxine XR  75 mg Oral Daily   Continuous Infusions: . sodium chloride    . sodium chloride    . sodium chloride    . sodium chloride    . phenylephrine (NEO-SYNEPHRINE) Adult infusion 0 mcg/min (08/10/16 2000)    Active Problems:   Other cirrhosis of liver (HCC)   Acute on chronic renal failure (HCC)   ARF (acute renal failure) (HCC)   Colitis   Hypertension   COPD (chronic obstructive pulmonary disease) (HCC)   Nephrolithiasis   Depression   Thrombocytopenia (HCC)   Morbid obesity (HCC)   CKD (chronic kidney disease) stage 4, GFR 15-29 ml/min (HCC)   Hypotension   Acute renal failure (ARF) (HCC)   CHF (congestive heart failure) ([place])   Rectal bleeding    Time spent: 35 minutes    [facility] Pager [phone]. If 7PM-7AM,  please contact night-coverage at [email or web address], password [facility] 08/22/2016, 6:36 PM  LOS: 20 days

## 2016-08-22 NOTE — Progress Notes (Signed)
Daily Rounding Note  08/22/2016, 11:35 AM  LOS: 20 days   SUBJECTIVE:   Chief complaint: rectal bleeding, is minor at this point.  Only seeing blood when she wipes after BMs.  BMs themselves are soft and not bloody    Eating 100% of meals.   0 to 100 ml urine daily over last 11 days.    OBJECTIVE:         Vital signs in last 24 hours:    Temp:  [97.6 F (36.4 C)-99.1 F (37.3 C)] 99.1 F (37.3 C) (04/18 0527) Pulse Rate:  [74-94] 91 (04/18 0527) Resp:  [12-19] 16 (04/18 0527) BP: (102-136)/(36-86) 111/42 (04/18 0527) SpO2:  [92 %-100 %] 97 % (04/18 0527) Weight:  [117.2 kg (258 lb 6.1 oz)-119.9 kg (264 lb 5.3 oz)] 117.6 kg (259 lb 3.2 oz) (04/18 0527) Last BM Date: 08/22/16 Filed Weights   08/21/16 1951 08/21/16 2354 08/22/16 0527  Weight: 119.9 kg (264 lb 5.3 oz) 117.2 kg (258 lb 6.1 oz) 117.6 kg (259 lb 3.2 oz)   General: obese, looks ill   Heart: RRR Chest: clear bil.  Abdomen: soft, NT, ND.  obese  Extremities: + extremity edema Neuro/Psych:  Oriented x 3.  Fully alert.  No gross deficits.   Intake/Output from previous day: 04/17 0701 - 04/18 0700 In: 400 [P.O.:300; IV Piggyback:100] Out: 2500   Intake/Output this shift: Total I/O In: 120 [P.O.:120] Out: -   Lab Results:  Recent Labs  08/20/16 0440 08/21/16 0436 08/22/16 0247  WBC 5.0 3.6* 4.6  HGB 8.9* 9.3* 10.5*  HCT 27.0* 27.6* 31.9*  PLT 71* 70* 72*   BMET  Recent Labs  08/20/16 0440 08/21/16 0436 08/22/16 0247  NA 134* 134* 134*  K 4.2 4.2 3.5  CL 102 104 97*  CO2 24 21* 28  GLUCOSE 72 68 75  BUN 20 25* 8  CREATININE 4.00* 4.21* 2.23*  CALCIUM 8.6* 8.8* 8.3*   LFT  Recent Labs  08/20/16 0440 08/21/16 0436 08/22/16 0247  ALBUMIN 2.8* 2.7* 3.1*   PT/INR  Recent Labs  08/20/16 1227  LABPROT 18.9*  INR 1.57   Hepatitis Panel No results for input(s): HEPBSAG, HCVAB, HEPAIGM, HEPBIGM in the last 72  hours.  Studies/Results: Ir Fluoro Guide Cv Line Left  Result Date: 08/21/2016 INDICATION: Renal failure EXAM: TUNNELED DIALYSIS CATHETER PLACEMENT, ULTRASOUND GUIDANCE FOR VASCULAR ACCESS MEDICATIONS: Ancef; The antibiotic was administered within an appropriate time interval prior to skin puncture. ANESTHESIA/SEDATION: Versed 1 mg IV; Fentanyl 50 mcg IV; Moderate Sedation Time:  15 The patient was continuously monitored during the procedure by the interventional radiology nurse under my direct supervision. FLUOROSCOPY TIME:  Fluoroscopy Time:  minutes 24 seconds (4 mGy). COMPLICATIONS: None immediate. PROCEDURE: Informed written consent was obtained from the patient after a thorough discussion of the procedural risks, benefits and alternatives. All questions were addressed. Maximal Sterile Barrier Technique was utilized including caps, mask, sterile gowns, sterile gloves, sterile drape, hand hygiene and skin antiseptic. A timeout was performed prior to the initiation of the procedure. The left neck was prepped with ChloraPrep in a sterile fashion, and a sterile drape was applied covering the operative field. A sterile gown and sterile gloves were used for the procedure. 1% lidocaine into the skin and subcutaneous tissue. The left jugular vein was noted to be patent initially with ultrasound. Under sonographic guidance, a micropuncture needle was inserted into the left IJ vein (Ultrasound and fluoroscopic  image documentation was performed). It was removed over an 018 wire which was up-sized to an Amplatz. This was advanced into the IVC. A small incision was made in the right upper chest. The tunneling device was utilized to advance the 23 centimeter tip to cuff catheter from the chest incision and out the neck incision. A peel-away sheath was advanced over the Amplatz wire. The leading edge of the catheter was then advanced through the peel-away sheath. The peel-away sheath was removed. It was flushed and  instilled with heparin. The chest incision was closed with a 0 Prolene pursestring stitch. The neck incision was closed with a 4-0 Vicryl subcuticular stitch. IMPRESSION: Successful left IJ vein tunneled dialysis catheter with its tip in the right atrium. Electronically Signed   By: Marybelle Killings M.D.   On: 08/21/2016 15:45   Ir US Guide Vasc Access Left  Result Date: 08/21/2016 INDICATION: Renal failure EXAM: TUNNELED DIALYSIS CATHETER PLACEMENT, ULTRASOUND GUIDANCE FOR VASCULAR ACCESS MEDICATIONS: Ancef; The antibiotic was administered within an appropriate time interval prior to skin puncture. ANESTHESIA/SEDATION: Versed 1 mg IV; Fentanyl 50 mcg IV; Moderate Sedation Time:  15 The patient was continuously monitored during the procedure by the interventional radiology nurse under my direct supervision. FLUOROSCOPY TIME:  Fluoroscopy Time:  minutes 24 seconds (4 mGy). COMPLICATIONS: None immediate. PROCEDURE: Informed written consent was obtained from the patient after a thorough discussion of the procedural risks, benefits and alternatives. All questions were addressed. Maximal Sterile Barrier Technique was utilized including caps, mask, sterile gowns, sterile gloves, sterile drape, hand hygiene and skin antiseptic. A timeout was performed prior to the initiation of the procedure. The left neck was prepped with ChloraPrep in a sterile fashion, and a sterile drape was applied covering the operative field. A sterile gown and sterile gloves were used for the procedure. 1% lidocaine into the skin and subcutaneous tissue. The left jugular vein was noted to be patent initially with ultrasound. Under sonographic guidance, a micropuncture needle was inserted into the left IJ vein (Ultrasound and fluoroscopic image documentation was performed). It was removed over an 018 wire which was up-sized to an Amplatz. This was advanced into the IVC. A small incision was made in the right upper chest. The tunneling device was  utilized to advance the 23 centimeter tip to cuff catheter from the chest incision and out the neck incision. A peel-away sheath was advanced over the Amplatz wire. The leading edge of the catheter was then advanced through the peel-away sheath. The peel-away sheath was removed. It was flushed and instilled with heparin. The chest incision was closed with a 0 Prolene pursestring stitch. The neck incision was closed with a 4-0 Vicryl subcuticular stitch. IMPRESSION: Successful left IJ vein tunneled dialysis catheter with its tip in the right atrium. Electronically Signed   By: Marybelle Killings M.D.   On: 08/21/2016 15:45   Scheduled Meds: . darbepoetin (ARANESP) injection - NON-DIALYSIS  200 mcg Subcutaneous Q Fri-1800  . furosemide  20 mg Intravenous Once  . Gerhardt's butt cream   Topical TID  . lactulose  20 g Oral BID WC  . midodrine  10 mg Oral TID WC  . pantoprazole  40 mg Oral BID  . rifaximin  550 mg Oral BID  . sodium chloride flush  10-40 mL Intracatheter Q12H  . venlafaxine XR  75 mg Oral Daily   Continuous Infusions: . sodium chloride    . sodium chloride    . sodium chloride    .  sodium chloride    . phenylephrine (NEO-SYNEPHRINE) Adult infusion 0 mcg/min (08/10/16 2000)   PRN Meds:.sodium chloride, sodium chloride, acetaminophen, albuterol, ALPRAZolam, alteplase, camphor-menthol, cyclobenzaprine, heparin, hydrOXYzine, lidocaine (PF), lidocaine-prilocaine, loratadine, ondansetron (ZOFRAN) IV, pentafluoroprop-tetrafluoroeth, sodium chloride flush, traMADol  ASSESMENT:   *  Hematochezia, suspect due to hemorrhoids.  Colonoscopy in 2013: hemorrhoids only.  Non-bleeding GU and post Bilroth 2 anatomy with patent gastro-J anastomosis on 01/2016 EGD (Dr Watt Climes). High risk for colonoscopy given obesity, low platelets, coagulopathy...  Dr Loletha Carrow will limit studies to unsedated flex sig.   Bleeding minimal at this point and ongoing use of 1% hydrocortisone/zinc/nystatin cream.  Flex sig may be  uneccessary.    *  NASH cirrhosis.  Followed at [facility] and Dr Lyndel Safe in [place].    *  Anemia, multifactorial.  Iron (Feraheme) and ESA weekly  *  Thrombocytopenia, coagulopathy.    *  Morbid obesity  *  Oliguric/anuric AKI. Initial mgt of this and massive volume overload with CRRT, then HD with last HD session 4/17, on TTS schedule.    LIJV HD catheter placed 4/17.     PLAN   *  Final decision re: flex sig per Dr Loletha Carrow. It may not be necessary.      Loretta [name]  08/22/2016, 11:35 AM Pager: [phone]  I have discussed the case with the PA, and that is the plan I formulated. I personally interviewed and examined the patient.  She has scant bleeding at this point. I feel the risk/benefit ratio does not favor sigmoidoscopy at this point, and she is in agreement.  Signing off - call as needed.   Nelida Meuse III Pager [phone]  Mon-Fri 8a-5p [phone] after 5p, weekends, holidays

## 2016-08-22 NOTE — Progress Notes (Signed)
[facility] Progress Note    Assessment/ Plan:   1. 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 Anemia- improving. Hgb 9.3 > 10.5. - Continue Arasnesp - Check CBC in the morning 3. T2DM- well-controlled - Per primary team 4. Massive obesity,NASH 5. Cirrhosis 6. L GI Bleed - GI following  Subjective:    Patient states she is doing well this morning. No concerns. She states she urinated a lot this morning while she was having a bowel movement.   Objective:   BP (!) 144/55 (BP Location: Right Arm)   Pulse 79   Temp 98.2 F (36.8 C) (Oral)   Resp 16   Ht '5\' 4"'$  (1.626 m)   Wt 259 lb 3.2 oz (117.6 kg)   SpO2 98%   BMI 44.49 kg/m   Intake/Output Summary (Last 24 hours) at 08/22/16 1451 Last data filed at 08/22/16 1100  Gross per 24 hour  Intake             1026 ml  Output             2500 ml  Net            -1474 ml   Weight change: -14.1 oz (-0.4 kg)  Physical Exam: Gen: Laying in bed in NAD, talkative CVS: RRR Resp: CTAB Abd: +BS, soft, non-tender, non-distended Ext: no edema  Imaging: Ir Fluoro Guide Cv Line Left  Result Date: 08/21/2016 INDICATION: Renal failure EXAM: TUNNELED DIALYSIS CATHETER PLACEMENT, ULTRASOUND GUIDANCE FOR VASCULAR ACCESS MEDICATIONS: Ancef; The antibiotic was administered within an appropriate time interval prior to skin puncture. ANESTHESIA/SEDATION: Versed 1 mg IV; Fentanyl 50 mcg IV; Moderate Sedation Time:  15 The patient was continuously monitored during the procedure by the interventional radiology nurse under my direct supervision. FLUOROSCOPY TIME:  Fluoroscopy Time:  minutes 24 seconds (4 mGy). COMPLICATIONS: None immediate. PROCEDURE: Informed written consent was obtained from the patient after a thorough discussion of the procedural risks, benefits and alternatives. All questions were addressed. Maximal Sterile Barrier Technique was utilized  including caps, mask, sterile gowns, sterile gloves, sterile drape, hand hygiene and skin antiseptic. A timeout was performed prior to the initiation of the procedure. The left neck was prepped with ChloraPrep in a sterile fashion, and a sterile drape was applied covering the operative field. A sterile gown and sterile gloves were used for the procedure. 1% lidocaine into the skin and subcutaneous tissue. The left jugular vein was noted to be patent initially with ultrasound. Under sonographic guidance, a micropuncture needle was inserted into the left IJ vein (Ultrasound and fluoroscopic image documentation was performed). It was removed over an 018 wire which was up-sized to an Amplatz. This was advanced into the IVC. A small incision was made in the right upper chest. The tunneling device was utilized to advance the 23 centimeter tip to cuff catheter from the chest incision and out the neck incision. A peel-away sheath was advanced over the Amplatz wire. The leading edge of the catheter was then advanced through the peel-away sheath. The peel-away sheath was removed. It was flushed and instilled with heparin. The chest incision was closed with a 0 Prolene pursestring stitch. The neck incision was closed with a 4-0 Vicryl subcuticular stitch. IMPRESSION: Successful left IJ vein tunneled dialysis catheter with its tip in the right atrium. Electronically Signed   By: Marybelle Killings M.D.   On: 08/21/2016  15:45   Ir US Guide Vasc Access Left  Result Date: 08/21/2016 INDICATION: Renal failure EXAM: TUNNELED DIALYSIS CATHETER PLACEMENT, ULTRASOUND GUIDANCE FOR VASCULAR ACCESS MEDICATIONS: Ancef; The antibiotic was administered within an appropriate time interval prior to skin puncture. ANESTHESIA/SEDATION: Versed 1 mg IV; Fentanyl 50 mcg IV; Moderate Sedation Time:  15 The patient was continuously monitored during the procedure by the interventional radiology nurse under my direct supervision. FLUOROSCOPY TIME:   Fluoroscopy Time:  minutes 24 seconds (4 mGy). COMPLICATIONS: None immediate. PROCEDURE: Informed written consent was obtained from the patient after a thorough discussion of the procedural risks, benefits and alternatives. All questions were addressed. Maximal Sterile Barrier Technique was utilized including caps, mask, sterile gowns, sterile gloves, sterile drape, hand hygiene and skin antiseptic. A timeout was performed prior to the initiation of the procedure. The left neck was prepped with ChloraPrep in a sterile fashion, and a sterile drape was applied covering the operative field. A sterile gown and sterile gloves were used for the procedure. 1% lidocaine into the skin and subcutaneous tissue. The left jugular vein was noted to be patent initially with ultrasound. Under sonographic guidance, a micropuncture needle was inserted into the left IJ vein (Ultrasound and fluoroscopic image documentation was performed). It was removed over an 018 wire which was up-sized to an Amplatz. This was advanced into the IVC. A small incision was made in the right upper chest. The tunneling device was utilized to advance the 23 centimeter tip to cuff catheter from the chest incision and out the neck incision. A peel-away sheath was advanced over the Amplatz wire. The leading edge of the catheter was then advanced through the peel-away sheath. The peel-away sheath was removed. It was flushed and instilled with heparin. The chest incision was closed with a 0 Prolene pursestring stitch. The neck incision was closed with a 4-0 Vicryl subcuticular stitch. IMPRESSION: Successful left IJ vein tunneled dialysis catheter with its tip in the right atrium. Electronically Signed   By: Marybelle Killings M.D.   On: 08/21/2016 15:45    Labs: BMET  Recent Labs Lab 08/15/16 1610 08/16/16 1254 08/17/16 0522 08/18/16 0351 08/19/16 0338 08/20/16 0440 08/21/16 0436 08/22/16 0247  NA 134* 132* 132* 130* 134* 134* 134* 134*  K 4.1 4.1  3.6 3.6 4.2 4.2 4.2 3.5  CL 101 102 98* 98* 102 102 104 97*  CO2 '22 23 27 24 28 24 '$ 21* 28  GLUCOSE 88 73 73 81 79 72 68 75  BUN 24* 27* 14 21* 12 20 25* 8  CREATININE 4.90* 5.17* 3.75* 4.48* 3.07* 4.00* 4.21* 2.23*  CALCIUM 9.1 8.5* 8.4* 8.6* 8.4* 8.6* 8.8* 8.3*  PHOS 3.7  --  3.7 3.8 3.4 3.8 4.5 2.8   CBC  Recent Labs Lab 08/18/16 0351 08/20/16 0440 08/21/16 0436 08/22/16 0247  WBC 4.5 5.0 3.6* 4.6  HGB 8.8* 8.9* 9.3* 10.5*  HCT 26.1* 27.0* 27.6* 31.9*  MCV 89.1 92.2 93.6 93.0  PLT 56* 71* 70* 72*    Medications:    . darbepoetin (ARANESP) injection - NON-DIALYSIS  200 mcg Subcutaneous Q Fri-1800  . furosemide  20 mg Intravenous Once  . Gerhardt's butt cream   Topical TID  . lactulose  20 g Oral BID WC  . midodrine  10 mg Oral TID WC  . pantoprazole  40 mg Oral BID  . rifaximin  550 mg Oral BID  . sodium chloride flush  10-40 mL Intracatheter Q12H  . venlafaxine XR  75  mg Oral Daily      Hyman Bible, MD PGY-2 Family Medicine Resident 08/22/2016, 2:51 PM

## 2016-08-23 LAB — RENAL FUNCTION PANEL
ANION GAP: 9 (ref 5–15)
Albumin: 2.7 g/dL — ABNORMAL LOW (ref 3.5–5.0)
BUN: 12 mg/dL (ref 6–20)
CALCIUM: 8.7 mg/dL — AB (ref 8.9–10.3)
CO2: 26 mmol/L (ref 22–32)
CREATININE: 3.21 mg/dL — AB (ref 0.44–1.00)
Chloride: 98 mmol/L — ABNORMAL LOW (ref 101–111)
GFR calc Af Amer: 17 mL/min — ABNORMAL LOW (ref >60)
GFR calc non Af Amer: 15 mL/min — ABNORMAL LOW (ref >60)
GLUCOSE: 66 mg/dL (ref 65–99)
Phosphorus: 4 mg/dL (ref 2.5–4.6)
Potassium: 3.7 mmol/L (ref 3.5–5.1)
SODIUM: 133 mmol/L — AB (ref 135–145)

## 2016-08-23 LAB — CBC WITH DIFFERENTIAL/PLATELET
BASOS PCT: 1 %
Basophils Absolute: 0 10*3/uL (ref 0.0–0.1)
EOS ABS: 0.3 10*3/uL (ref 0.0–0.7)
Eosinophils Relative: 10 %
HCT: 28.6 % — ABNORMAL LOW (ref 36.0–46.0)
HEMOGLOBIN: 9.6 g/dL — AB (ref 12.0–15.0)
LYMPHS ABS: 1.6 10*3/uL (ref 0.7–4.0)
LYMPHS PCT: 43 %
MCH: 31.7 pg (ref 26.0–34.0)
MCHC: 33.6 g/dL (ref 30.0–36.0)
MCV: 94.4 fL (ref 78.0–100.0)
Monocytes Absolute: 0.6 10*3/uL (ref 0.1–1.0)
Monocytes Relative: 19 %
NEUTROS ABS: 0.9 10*3/uL — AB (ref 1.7–7.7)
Neutrophils Relative %: 27 %
Platelets: 65 10*3/uL — ABNORMAL LOW (ref 150–400)
RBC: 3.03 MIL/uL — ABNORMAL LOW (ref 3.87–5.11)
RDW: 24.8 % — ABNORMAL HIGH (ref 11.5–15.5)
WBC: 3.4 10*3/uL — ABNORMAL LOW (ref 4.0–10.5)

## 2016-08-23 LAB — PATHOLOGIST SMEAR REVIEW

## 2016-08-23 NOTE — Progress Notes (Signed)
Loretta [name] Progress Note    Assessment/ Plan:   1. 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 Anemia- stable - Continue Arasnesp - Check CBC in the morning 3. T2DM- well-controlled - Per primary team 4. Massive obesity,NASH 5. Cirrhosis 6. L GI Bleed - GI following  Subjective:    Patient has no concerns this morning. She is very happy to hear that she will not be going to HD today.   Objective:   BP (!) 129/46   Pulse 82   Temp 98.4 F (36.9 C) (Oral)   Resp 18   Ht '5\' 4"'$  (1.626 m)   Wt 258 lb 6.4 oz (117.2 kg)   SpO2 99%   BMI 44.35 kg/m   Intake/Output Summary (Last 24 hours) at 08/23/16 1148 Last data filed at 08/23/16 0926  Gross per 24 hour  Intake              480 ml  Output                0 ml  Net              480 ml   Weight change: -5 lb 14.9 oz (-2.69 kg)  Physical Exam: Gen: Laying in bed in NAD, talkative, pleasant CVS: RRR Resp: CTAB Abd: +BS, soft, non-tender, non-distended Ext: no edema, warm and well-perfused  Imaging: Ir Fluoro Guide Cv Line Left  Result Date: 08/21/2016 INDICATION: Renal failure EXAM: TUNNELED DIALYSIS CATHETER PLACEMENT, ULTRASOUND GUIDANCE FOR VASCULAR ACCESS MEDICATIONS: Ancef; The antibiotic was administered within an appropriate time interval prior to skin puncture. ANESTHESIA/SEDATION: Versed 1 mg IV; Fentanyl 50 mcg IV; Moderate Sedation Time:  15 The patient was continuously monitored during the procedure by the interventional radiology nurse under my direct supervision. FLUOROSCOPY TIME:  Fluoroscopy Time:  minutes 24 seconds (4 mGy). COMPLICATIONS: None immediate. PROCEDURE: Informed written consent was obtained from the patient after a thorough discussion of the procedural risks, benefits and alternatives. All questions were addressed. Maximal Sterile Barrier Technique was  utilized including caps, mask, sterile gowns, sterile gloves, sterile drape, hand hygiene and skin antiseptic. A timeout was performed prior to the initiation of the procedure. The left neck was prepped with ChloraPrep in a sterile fashion, and a sterile drape was applied covering the operative field. A sterile gown and sterile gloves were used for the procedure. 1% lidocaine into the skin and subcutaneous tissue. The left jugular vein was noted to be patent initially with ultrasound. Under sonographic guidance, a micropuncture needle was inserted into the left IJ vein (Ultrasound and fluoroscopic image documentation was performed). It was removed over an 018 wire which was up-sized to an Amplatz. This was advanced into the IVC. A small incision was made in the right upper chest. The tunneling device was utilized to advance the 23 centimeter tip to cuff catheter from the chest incision and out the neck incision. A peel-away sheath was advanced over the Amplatz wire. The leading edge of the catheter was then advanced through the peel-away sheath. The peel-away sheath was removed. It was flushed and instilled with heparin. The chest incision was closed with a 0 Prolene pursestring stitch. The neck incision was closed with a 4-0 Vicryl subcuticular stitch. IMPRESSION: Successful left IJ vein tunneled dialysis catheter with its tip in the right atrium. Electronically Signed  By: Marybelle Killings M.D.   On: 08/21/2016 15:45   Ir US Guide Vasc Access Left  Result Date: 08/21/2016 INDICATION: Renal failure EXAM: TUNNELED DIALYSIS CATHETER PLACEMENT, ULTRASOUND GUIDANCE FOR VASCULAR ACCESS MEDICATIONS: Ancef; The antibiotic was administered within an appropriate time interval prior to skin puncture. ANESTHESIA/SEDATION: Versed 1 mg IV; Fentanyl 50 mcg IV; Moderate Sedation Time:  15 The patient was continuously monitored during the procedure by the interventional radiology nurse under my direct supervision. FLUOROSCOPY  TIME:  Fluoroscopy Time:  minutes 24 seconds (4 mGy). COMPLICATIONS: None immediate. PROCEDURE: Informed written consent was obtained from the patient after a thorough discussion of the procedural risks, benefits and alternatives. All questions were addressed. Maximal Sterile Barrier Technique was utilized including caps, mask, sterile gowns, sterile gloves, sterile drape, hand hygiene and skin antiseptic. A timeout was performed prior to the initiation of the procedure. The left neck was prepped with ChloraPrep in a sterile fashion, and a sterile drape was applied covering the operative field. A sterile gown and sterile gloves were used for the procedure. 1% lidocaine into the skin and subcutaneous tissue. The left jugular vein was noted to be patent initially with ultrasound. Under sonographic guidance, a micropuncture needle was inserted into the left IJ vein (Ultrasound and fluoroscopic image documentation was performed). It was removed over an 018 wire which was up-sized to an Amplatz. This was advanced into the IVC. A small incision was made in the right upper chest. The tunneling device was utilized to advance the 23 centimeter tip to cuff catheter from the chest incision and out the neck incision. A peel-away sheath was advanced over the Amplatz wire. The leading edge of the catheter was then advanced through the peel-away sheath. The peel-away sheath was removed. It was flushed and instilled with heparin. The chest incision was closed with a 0 Prolene pursestring stitch. The neck incision was closed with a 4-0 Vicryl subcuticular stitch. IMPRESSION: Successful left IJ vein tunneled dialysis catheter with its tip in the right atrium. Electronically Signed   By: Marybelle Killings M.D.   On: 08/21/2016 15:45    Labs: BMET  Recent Labs Lab 08/17/16 0522 08/18/16 0351 08/19/16 0338 08/20/16 0440 08/21/16 0436 08/22/16 0247 08/23/16 0404  NA 132* 130* 134* 134* 134* 134* 133*  K 3.6 3.6 4.2 4.2 4.2 3.5  3.7  CL 98* 98* 102 102 104 97* 98*  CO2 '27 24 28 24 '$ 21* 28 26  GLUCOSE 73 81 79 72 68 75 66  BUN 14 21* 12 20 25* 8 12  CREATININE 3.75* 4.48* 3.07* 4.00* 4.21* 2.23* 3.21*  CALCIUM 8.4* 8.6* 8.4* 8.6* 8.8* 8.3* 8.7*  PHOS 3.7 3.8 3.4 3.8 4.5 2.8 4.0   CBC  Recent Labs Lab 08/20/16 0440 08/21/16 0436 08/22/16 0247 08/23/16 0404  WBC 5.0 3.6* 4.6 3.4*  NEUTROABS  --   --   --  0.9*  HGB 8.9* 9.3* 10.5* 9.6*  HCT 27.0* 27.6* 31.9* 28.6*  MCV 92.2 93.6 93.0 94.4  PLT 71* 70* 72* 65*    Medications:    . darbepoetin (ARANESP) injection - NON-DIALYSIS  200 mcg Subcutaneous Q Fri-1800  . furosemide  20 mg Intravenous Once  . Gerhardt's butt cream   Topical TID  . lactulose  20 g Oral BID WC  . midodrine  10 mg Oral TID WC  . pantoprazole  40 mg Oral BID  . rifaximin  550 mg Oral BID  . sodium chloride flush  10-40 mL  Intracatheter Q12H  . venlafaxine XR  75 mg Oral Daily      Hyman Bible, MD PGY-2 Family Medicine Resident 08/23/2016, 11:48 AM

## 2016-08-23 NOTE — Care Management Note (Signed)
Case Management Note  Patient Details  Name: Loretta [name] MRN: [id number] Date of Birth: April 03, 1956  Subjective/Objective:                    Action/Plan:  Patient has progressed and no longer needs HHPT Expected Discharge Date:                  Expected Discharge Plan:  Home/Self Care  In-House Referral:  NA  Discharge planning Services  CM Consult  Post Acute Care Choice:    Choice offered to:  Patient  DME Arranged:  [facility] Rile rolling (Has RW and Cane at home) DME Agency:  [place]:    [place]:     Status of Service:  Completed, signed off  If discussed at [employer] of Stay Meetings, dates discussed:    Additional Comments:  Marilu Favre, RN 08/23/2016, 1:56 PM

## 2016-08-23 NOTE — Progress Notes (Signed)
Physical Therapy Treatment Patient Details Name: Loretta [name] MRN: [id number] DOB: 1955/12/03 Today's Date: 08/23/2016    History of Present Illness Pt adm with acute renal failure and CVVHD initiated on 4/1. CVVHD stopped 4/9. PMH - NASH cirrhosis, morbid obesity, gastric bypass    PT Comments    Pt is making good progress towards her goals. Pt is mod I for transfers and min guard for ambulation of 650 feet with RW. Pt given handout on LE therex and able to perform with minimal verbal cues. PT discussed need for HHPT and pt and PT concur that HHPT is not needed at this time, case manager notified. Pt requires continued skilled PT in the acute session to progress LE strengthening and ambulation to be able to safely navigate her home environment at discharge.   Follow Up Recommendations  No PT follow up     Equipment Recommendations  Rolling walker with 5" wheels    Recommendations for Other Services       Precautions / Restrictions Precautions Precautions: Fall Restrictions Weight Bearing Restrictions: No    Mobility  Bed Mobility               General bed mobility comments: Pt in bathroom on entry  Transfers Overall transfer level: Needs assistance Equipment used: Rolling walker (2 wheeled) Transfers: Sit to/from Stand Sit to Stand: Modified independent (Device/Increase time)         General transfer comment: no assist, good hand placement and descent into chair  Ambulation/Gait Ambulation/Gait assistance: Min guard Ambulation Distance (Feet): 650 Feet Assistive device: Rolling walker (2 wheeled) Gait Pattern/deviations: Step-through pattern;Decreased step length - right;Decreased step length - left;Trunk flexed Gait velocity: slow Gait velocity interpretation: Below normal speed for age/gender General Gait Details: slow, steady ambulation able to carry on conversation and point out things while continuing ambulation       Balance Overall balance  assessment: Needs assistance Sitting-balance support: No upper extremity supported;Feet supported Sitting balance-Leahy Scale: Good     Standing balance support: Single extremity supported Standing balance-Leahy Scale: Good Standing balance comment: able to let go of RW to get things off bedside table before sitting                            Cognition Arousal/Alertness: Awake/alert Behavior During Therapy: WFL for tasks assessed/performed Overall Cognitive Status: Within Functional Limits for tasks assessed                                        Exercises General Exercises - Lower Extremity Hip ABduction/ADduction: AROM;Both;10 reps;Standing Straight Leg Raises: AROM;Both;10 reps;Standing Hip Flexion/Marching: AROM;Both;10 reps;Standing Mini-Sqauts: AROM;Both;10 reps;Standing        Pertinent Vitals/Pain Pain Assessment: No/denies pain  VSS           PT Goals (current goals can now be found in the care plan section) Acute Rehab PT Goals Patient Stated Goal: go home PT Goal Formulation: With patient Time For Goal Achievement: 08/22/16 Potential to Achieve Goals: Good    Frequency    Min 3X/week      PT Plan Discharge plan needs to be updated       End of Session Equipment Utilized During Treatment: Gait belt Activity Tolerance: Patient limited by fatigue Patient left: with call bell/phone within reach;in chair;with family/visitor present Nurse Communication: Mobility status PT Visit Diagnosis:  Muscle weakness (generalized) (M62.81);Difficulty in walking, not elsewhere classified (R26.2)     Time: 2951-8841 PT Time Calculation (min) (ACUTE ONLY): 24 min  Charges:  $Gait Training: 8-22 mins $Therapeutic Exercise: 8-22 mins                    G Codes:       Diannah Rindfleisch B. Migdalia Dk PT, DPT Acute Rehabilitation  [phone] Pager [phone]     [place] 08/23/2016, 4:12 PM

## 2016-08-23 NOTE — Progress Notes (Signed)
[facility] PROGRESS NOTE  Loretta [name] IDP:[id number] DOB: 05/30/1955 DOA: 08/02/2016 PCP: Cyndy Freeze, MD   Hospital course: HPI: Loretta [name] a 61 y.o.femalewith medical history significant of liver cirrhosis secondary to NASH, history of edema treated with Lasix. Presenting today after patient was found to have elevated serum creatinine level at her primary care's office. Patient was recently treated for colitis of which she was sent home with Cipro and Flagyl. She completed 12 out of the 14 days of treatment. She still has some abdominal discomfort. Otherwise patient reports no new complaints. Patient denies any fevers.  61 yo MO(273lbs) hx of gastric bypass, non acholic cirrhosis , acute on chronic renal dz, not a candidate for liver tx due to obesity, who is moved to ICU for HD and HD cath placement. PCCM asked to follow in ICU. She is awake and alert and in NAD. RIJ HD catheter was initially placed and HD started but with slow renal recovery patient is going to require longer term HD, Tunneled HD cath was placed on 4/17.      Assessment/Plan:  Acute on chronic kidney disease Stage 3: Oliguric AKI, -Pt still dialysis dependent, nephrology ordered [facility] with IR for 4/16 -Continue midodrine -DC Phenylphrine, has not needed. -Nephrology following for HD needs -RIJ catheter removed 4/14 -Tunneled HD cath placed 4/17 - Per nephrology.  COPD without acute exacerbation: Not on home therapy. PRN bronchodilators  OSA: CPAP as needed.  Hematochezia/LGIB - GI consult appreciated. Likely hemorrhoidal in nature. Bleeding improving. Hemoglobin currently at 9.6. She was taken off subcut heparin and given vit K.  They are not planning colonoscopy at this time and possible flexible sigmoidoscopy, continue supportive care.  Hg has been stable to improved.   follow H&H.     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 Undergoing transplant  workup at [facility], however not felt to be a candidate due to obesity at this time.    Chronic normocytic anemia: Baseline Hb 8 and has been stable post recent transfusion. Hemoglobin currently at 9.6.  Fall in hospital - Xrays of both knees, no acute findings.  PT following.  Thrombocytopenia: in setting of CLD - Platelets slowly rising, off heparin  Depression and anxiety: Continue venlafaxine and tramadol  Headache: Continue tramadol and Flexeril.  Pruritus: Loratadine, hydroxyzine as needed. Increased atarax to 25 mg TID prn.    Code Status: Full Family Communication: Updated patient. No family at bedside. Disposition Plan: Home when improved clinically, renal function back to baseline and per nephrology and gastroenterology.   Consultants:  Nephrology: Dr. Marval Regal 08/02/2016  PCCM: Dr. Chase Caller 08/04/2016  Gastroenterology: Dr. Ardis Hughs 08/16/2016  Interventional radiology Dr. Geroge Baseman 08/20/2016  Procedures:  Plain films of the knees 08/16/2016  Chest x-ray 08/04/2016, 08/06/2016, 08/15/2016  Antibiotics:  IV ciprofloxacin 08/05/2016>>> 08/06/2016  Oral Flagyl  08/05/2016>>>>> 08/06/2016    HPI/Subjective: Patient sitting up in chair with family at bedside. Patient denies any shortness of breath. No chest pain. Patient states has been having urine output however that has not been recorded. Patient denies any further bleeding.   Objective: Vitals:   08/23/16 1011 08/23/16 1146  BP: (!) 135/48 (!) 129/46  Pulse: 82   Resp: 18   Temp: 98.4 F (36.9 C)     Intake/Output Summary (Last 24 hours) at 08/23/16 1233 Last data filed at 08/23/16 0926  Gross per 24 hour  Intake              480 ml  Output                0 ml  Net              480 ml   Filed Weights   08/21/16 2354 08/22/16 0527 08/23/16 0618  Weight: 117.2 kg (258 lb 6.1 oz) 117.6 kg (259 lb 3.2 oz) 117.2 kg (258 lb 6.4 oz)    Exam:   General:  NAD  Cardiovascular:  RRR  Respiratory: CTAB  Abdomen: Soft, nontender, nondistended, positive bowel sounds.  Musculoskeletal: No clubbing cyanosis or edema.  Data Reviewed: Basic Metabolic Panel:  Recent Labs Lab 08/17/16 0522 08/18/16 0351 08/19/16 0338 08/20/16 0440 08/21/16 0436 08/22/16 0247 08/23/16 0404  NA 132* 130* 134* 134* 134* 134* 133*  K 3.6 3.6 4.2 4.2 4.2 3.5 3.7  CL 98* 98* 102 102 104 97* 98*  CO2 '27 24 28 24 '$ 21* 28 26  GLUCOSE 73 81 79 72 68 75 66  BUN 14 21* 12 20 25* 8 12  CREATININE 3.75* 4.48* 3.07* 4.00* 4.21* 2.23* 3.21*  CALCIUM 8.4* 8.6* 8.4* 8.6* 8.8* 8.3* 8.7*  MG 2.2 2.3 2.3 2.4  --   --   --   PHOS 3.7 3.8 3.4 3.8 4.5 2.8 4.0   Liver Function Tests:  Recent Labs Lab 08/16/16 1254  08/19/16 0338 08/20/16 0440 08/21/16 0436 08/22/16 0247 08/23/16 0404  AST 55*  --   --   --   --   --   --   ALT 19  --   --   --   --   --   --   ALKPHOS 106  --   --   --   --   --   --   BILITOT 1.8*  --   --   --   --   --   --   PROT 5.0*  --   --   --   --   --   --   ALBUMIN 2.8*  < > 2.8* 2.8* 2.7* 3.1* 2.7*  < > = values in this interval not displayed. No results for input(s): LIPASE, AMYLASE in the last 168 hours. No results for input(s): AMMONIA in the last 168 hours. CBC:  Recent Labs Lab 08/18/16 0351 08/20/16 0440 08/21/16 0436 08/22/16 0247 08/23/16 0404  WBC 4.5 5.0 3.6* 4.6 3.4*  NEUTROABS  --   --   --   --  0.9*  HGB 8.8* 8.9* 9.3* 10.5* 9.6*  HCT 26.1* 27.0* 27.6* 31.9* 28.6*  MCV 89.1 92.2 93.6 93.0 94.4  PLT 56* 71* 70* 72* 65*   Cardiac Enzymes: No results for input(s): CKTOTAL, CKMB, CKMBINDEX, TROPONINI in the last 168 hours. BNP (last 3 results) No results for input(s): BNP in the last 8760 hours.  ProBNP (last 3 results) No results for input(s): PROBNP in the last 8760 hours.  CBG: No results for input(s): GLUCAP in the last 168 hours.  No results found for this or any previous visit (from the past 240 hour(s)).    Studies: Ir Fluoro Guide Cv Line Left  Result Date: 08/21/2016 INDICATION: Renal failure EXAM: TUNNELED DIALYSIS CATHETER PLACEMENT, ULTRASOUND GUIDANCE FOR VASCULAR ACCESS MEDICATIONS: Ancef; The antibiotic was administered within an appropriate time interval prior to skin puncture. ANESTHESIA/SEDATION: Versed 1 mg IV; Fentanyl 50 mcg IV; Moderate Sedation Time:  15 The patient was continuously monitored during the procedure by the interventional radiology nurse under my direct supervision. FLUOROSCOPY TIME:  Fluoroscopy Time:  minutes 24 seconds (4 mGy). COMPLICATIONS: None immediate. PROCEDURE: Informed written consent was obtained from the patient after a thorough discussion of the procedural risks, benefits and alternatives. All questions were addressed. Maximal Sterile Barrier Technique was utilized including caps, mask, sterile gowns, sterile gloves, sterile drape, hand hygiene and skin antiseptic. A timeout was performed prior to the initiation of the procedure. The left neck was prepped with ChloraPrep in a sterile fashion, and a sterile drape was applied covering the operative field. A sterile gown and sterile gloves were used for the procedure. 1% lidocaine into the skin and subcutaneous tissue. The left jugular vein was noted to be patent initially with ultrasound. Under sonographic guidance, a micropuncture needle was inserted into the left IJ vein (Ultrasound and fluoroscopic image documentation was performed). It was removed over an 018 wire which was up-sized to an Amplatz. This was advanced into the IVC. A small incision was made in the right upper chest. The tunneling device was utilized to advance the 23 centimeter tip to cuff catheter from the chest incision and out the neck incision. A peel-away sheath was advanced over the Amplatz wire. The leading edge of the catheter was then advanced through the peel-away sheath. The peel-away sheath was removed. It was flushed and instilled with  heparin. The chest incision was closed with a 0 Prolene pursestring stitch. The neck incision was closed with a 4-0 Vicryl subcuticular stitch. IMPRESSION: Successful left IJ vein tunneled dialysis catheter with its tip in the right atrium. Electronically Signed   By: Marybelle Killings M.D.   On: 08/21/2016 15:45   Ir US Guide Vasc Access Left  Result Date: 08/21/2016 INDICATION: Renal failure EXAM: TUNNELED DIALYSIS CATHETER PLACEMENT, ULTRASOUND GUIDANCE FOR VASCULAR ACCESS MEDICATIONS: Ancef; The antibiotic was administered within an appropriate time interval prior to skin puncture. ANESTHESIA/SEDATION: Versed 1 mg IV; Fentanyl 50 mcg IV; Moderate Sedation Time:  15 The patient was continuously monitored during the procedure by the interventional radiology nurse under my direct supervision. FLUOROSCOPY TIME:  Fluoroscopy Time:  minutes 24 seconds (4 mGy). COMPLICATIONS: None immediate. PROCEDURE: Informed written consent was obtained from the patient after a thorough discussion of the procedural risks, benefits and alternatives. All questions were addressed. Maximal Sterile Barrier Technique was utilized including caps, mask, sterile gowns, sterile gloves, sterile drape, hand hygiene and skin antiseptic. A timeout was performed prior to the initiation of the procedure. The left neck was prepped with ChloraPrep in a sterile fashion, and a sterile drape was applied covering the operative field. A sterile gown and sterile gloves were used for the procedure. 1% lidocaine into the skin and subcutaneous tissue. The left jugular vein was noted to be patent initially with ultrasound. Under sonographic guidance, a micropuncture needle was inserted into the left IJ vein (Ultrasound and fluoroscopic image documentation was performed). It was removed over an 018 wire which was up-sized to an Amplatz. This was advanced into the IVC. A small incision was made in the right upper chest. The tunneling device was utilized to  advance the 23 centimeter tip to cuff catheter from the chest incision and out the neck incision. A peel-away sheath was advanced over the Amplatz wire. The leading edge of the catheter was then advanced through the peel-away sheath. The peel-away sheath was removed. It was flushed and instilled with heparin. The chest incision was closed with a 0 Prolene pursestring stitch. The neck incision was closed with a 4-0 Vicryl subcuticular stitch. IMPRESSION: Successful left  IJ vein tunneled dialysis catheter with its tip in the right atrium. Electronically Signed   By: Marybelle Killings M.D.   On: 08/21/2016 15:45    Scheduled Meds: . darbepoetin (ARANESP) injection - NON-DIALYSIS  200 mcg Subcutaneous Q Fri-1800  . furosemide  20 mg Intravenous Once  . Gerhardt's butt cream   Topical TID  . lactulose  20 g Oral BID WC  . midodrine  10 mg Oral TID WC  . pantoprazole  40 mg Oral BID  . rifaximin  550 mg Oral BID  . sodium chloride flush  10-40 mL Intracatheter Q12H  . venlafaxine XR  75 mg Oral Daily   Continuous Infusions: . sodium chloride    . sodium chloride    . sodium chloride    . sodium chloride    . phenylephrine (NEO-SYNEPHRINE) Adult infusion 0 mcg/min (08/10/16 2000)    Active Problems:   Other cirrhosis of liver (HCC)   Acute on chronic renal failure (HCC)   ARF (acute renal failure) (HCC)   Colitis   Hypertension   COPD (chronic obstructive pulmonary disease) (HCC)   Nephrolithiasis   Depression   Thrombocytopenia (HCC)   Morbid obesity (HCC)   CKD (chronic kidney disease) stage 4, GFR 15-29 ml/min (HCC)   Hypotension   Acute renal failure (ARF) (HCC)   CHF (congestive heart failure) ([place])   Rectal bleeding    Time spent: 35 minutes    [facility] Pager [phone]. If 7PM-7AM, please contact night-coverage at [email or web address], password [facility] 08/23/2016, 12:33 PM  LOS: 21 days

## 2016-08-23 NOTE — Care Management Important Message (Signed)
Important Message  Patient Details  Name: Loretta [name] MRN: [id number] Date of Birth: [birth year]   Medicare Important Message Given:  Yes    Orbie Pyo 08/23/2016, 12:29 PM

## 2016-08-24 LAB — RENAL FUNCTION PANEL
Albumin: 2.6 g/dL — ABNORMAL LOW (ref 3.5–5.0)
Anion gap: 10 (ref 5–15)
BUN: 18 mg/dL (ref 6–20)
CHLORIDE: 102 mmol/L (ref 101–111)
CO2: 23 mmol/L (ref 22–32)
CREATININE: 3.51 mg/dL — AB (ref 0.44–1.00)
Calcium: 8.7 mg/dL — ABNORMAL LOW (ref 8.9–10.3)
GFR calc non Af Amer: 13 mL/min — ABNORMAL LOW (ref >60)
GFR, EST AFRICAN AMERICAN: 15 mL/min — AB (ref >60)
Glucose, Bld: 60 mg/dL — ABNORMAL LOW (ref 65–99)
Phosphorus: 4 mg/dL (ref 2.5–4.6)
Potassium: 3.7 mmol/L (ref 3.5–5.1)
Sodium: 135 mmol/L (ref 135–145)

## 2016-08-24 LAB — CBC
HCT: 32.1 % — ABNORMAL LOW (ref 36.0–46.0)
Hemoglobin: 10.5 g/dL — ABNORMAL LOW (ref 12.0–15.0)
MCH: 31.1 pg (ref 26.0–34.0)
MCHC: 32.7 g/dL (ref 30.0–36.0)
MCV: 95 fL (ref 78.0–100.0)
PLATELETS: 67 10*3/uL — AB (ref 150–400)
RBC: 3.38 MIL/uL — AB (ref 3.87–5.11)
RDW: 24.9 % — AB (ref 11.5–15.5)
WBC: 3.4 10*3/uL — ABNORMAL LOW (ref 4.0–10.5)

## 2016-08-24 MED ORDER — DARBEPOETIN ALFA 200 MCG/0.4ML IJ SOSY
200.0000 ug | PREFILLED_SYRINGE | INTRAMUSCULAR | Status: DC
  Administered 2016-08-25: 200 ug via SUBCUTANEOUS
  Filled 2016-08-24 (×2): qty 0.4

## 2016-08-24 NOTE — Progress Notes (Signed)
[facility] PROGRESS NOTE  Loretta [name] RCV:[id number] DOB: [birth year] DOA: 08/02/2016 PCP: Cyndy Freeze, MD   Hospital course: HPI: Loretta [name] a 61 y.o.femalewith medical history significant of liver cirrhosis secondary to NASH, history of edema treated with Lasix. Presenting today after patient was found to have elevated serum creatinine level at her primary care's office. Patient was recently treated for colitis of which she was sent home with Cipro and Flagyl. She completed 12 out of the 14 days of treatment. She still has some abdominal discomfort. Otherwise patient reports no new complaints. Patient denies any fevers.  61 yo MO(273lbs) hx of gastric bypass, non acholic cirrhosis , acute on chronic renal dz, not a candidate for liver tx due to obesity, who is moved to ICU for HD and HD cath placement. PCCM asked to follow in ICU. She is awake and alert and in NAD. RIJ HD catheter was initially placed and HD started but with slow renal recovery patient is going to require longer term HD, Tunneled HD cath was placed on 4/17.      Assessment/Plan:  Acute on chronic kidney disease Stage 3: Oliguric AKI, -Pt still dialysis dependent, nephrology ordered [facility] with IR for 4/16 - Urine output of 600 mL over the past 24 hours and 200 mL this morning. No significant change in renal function over the past 24 hours. -Continue midodrine -DC Phenylphrine, has not needed. -Nephrology following for HD needs -RIJ catheter removed 4/14 -Tunneled HD cath placed 4/17 - Per nephrology.  COPD without acute exacerbation: Not on home therapy. PRN bronchodilators  OSA: CPAP as needed.  Hematochezia/LGIB - GI consult appreciated. Likely hemorrhoidal in nature. Bleeding improving. Hemoglobin currently at 10.5. She was taken off subcut heparin and given vit K.  They are not planning colonoscopy at this time and possible flexible sigmoidoscopy, continue supportive care.  Hg has been stable  to improved.   follow H&H.     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 Undergoing transplant workup at [facility], however not felt to be a candidate due to obesity at this time.    Chronic normocytic anemia: Baseline Hb 8 and has been stable post recent transfusion. Hemoglobin currently at 10.5.  Fall in hospital - Xrays of both knees, no acute findings.  PT following.  Thrombocytopenia: in setting of CLD - Platelets slowly rising and seem to be stabilizing, off heparin  Depression and anxiety: Continue venlafaxine and tramadol  Headache: Continue tramadol and Flexeril.  Pruritus: Loratadine, hydroxyzine as needed. Increased atarax to 25 mg TID prn.    Code Status: Full Family Communication: Updated patient. No family at bedside. Disposition Plan: Home when improved clinically, renal function back to baseline and per nephrology and gastroenterology.   Consultants:  Nephrology: Dr. Marval Regal 08/02/2016  PCCM: Dr. Chase Caller 08/04/2016  Gastroenterology: Dr. Ardis Hughs 08/16/2016  Interventional radiology Dr. Geroge Baseman 08/20/2016  Procedures:  Plain films of the knees 08/16/2016  Chest x-ray 08/04/2016, 08/06/2016, 08/15/2016  Antibiotics:  IV ciprofloxacin 08/05/2016>>> 08/06/2016  Oral Flagyl  08/05/2016>>>>> 08/06/2016    HPI/Subjective: Patient sitting up in chair watching television. Patient denies any shortness of breath. No chest pain. Patient states has been having urine output. Patient denies any further bleeding.   Objective: Vitals:   08/23/16 2014 08/24/16 0455  BP: (!) 144/73 (!) 115/53  Pulse: 85 79  Resp: 18 18  Temp: 98.2 F (36.8 C) 98.6 F (37 C)    Intake/Output Summary (Last 24 hours) at 08/24/16 0847 Last  data filed at 08/24/16 0753  Gross per 24 hour  Intake              822 ml  Output              800 ml  Net               22 ml   Filed Weights   08/22/16 0527 08/23/16 0618 08/24/16 0605   Weight: 117.6 kg (259 lb 3.2 oz) 117.2 kg (258 lb 6.4 oz) 116.9 kg (257 lb 11.2 oz)    Exam:   General:  NAD  Cardiovascular: RRR  Respiratory: CTAB  Abdomen: Soft, nontender, nondistended, positive bowel sounds.  Musculoskeletal: No clubbing cyanosis or edema.  Data Reviewed: Basic Metabolic Panel:  Recent Labs Lab 08/18/16 0351 08/19/16 0338 08/20/16 0440 08/21/16 0436 08/22/16 0247 08/23/16 0404 08/24/16 0552  NA 130* 134* 134* 134* 134* 133* 135  K 3.6 4.2 4.2 4.2 3.5 3.7 3.7  CL 98* 102 102 104 97* 98* 102  CO2 '24 28 24 '$ 21* '28 26 23  '$ GLUCOSE 81 79 72 68 75 66 60*  BUN 21* 12 20 25* '8 12 18  '$ CREATININE 4.48* 3.07* 4.00* 4.21* 2.23* 3.21* 3.51*  CALCIUM 8.6* 8.4* 8.6* 8.8* 8.3* 8.7* 8.7*  MG 2.3 2.3 2.4  --   --   --   --   PHOS 3.8 3.4 3.8 4.5 2.8 4.0 4.0   Liver Function Tests:  Recent Labs Lab 08/20/16 0440 08/21/16 0436 08/22/16 0247 08/23/16 0404 08/24/16 0552  ALBUMIN 2.8* 2.7* 3.1* 2.7* 2.6*   No results for input(s): LIPASE, AMYLASE in the last 168 hours. No results for input(s): AMMONIA in the last 168 hours. CBC:  Recent Labs Lab 08/20/16 0440 08/21/16 0436 08/22/16 0247 08/23/16 0404 08/24/16 0552  WBC 5.0 3.6* 4.6 3.4* 3.4*  NEUTROABS  --   --   --  0.9*  --   HGB 8.9* 9.3* 10.5* 9.6* 10.5*  HCT 27.0* 27.6* 31.9* 28.6* 32.1*  MCV 92.2 93.6 93.0 94.4 95.0  PLT 71* 70* 72* 65* 67*   Cardiac Enzymes: No results for input(s): CKTOTAL, CKMB, CKMBINDEX, TROPONINI in the last 168 hours. BNP (last 3 results) No results for input(s): BNP in the last 8760 hours.  ProBNP (last 3 results) No results for input(s): PROBNP in the last 8760 hours.  CBG: No results for input(s): GLUCAP in the last 168 hours.  No results found for this or any previous visit (from the past 240 hour(s)).   Studies: No results found.  Scheduled Meds: . [START ON 08/25/2016] darbepoetin (ARANESP) injection - NON-DIALYSIS  200 mcg Subcutaneous Q Sat-1800   . furosemide  20 mg Intravenous Once  . Gerhardt's butt cream   Topical TID  . lactulose  20 g Oral BID WC  . midodrine  10 mg Oral TID WC  . pantoprazole  40 mg Oral BID  . rifaximin  550 mg Oral BID  . sodium chloride flush  10-40 mL Intracatheter Q12H  . venlafaxine XR  75 mg Oral Daily   Continuous Infusions: . sodium chloride    . sodium chloride    . sodium chloride    . sodium chloride    . phenylephrine (NEO-SYNEPHRINE) Adult infusion 0 mcg/min (08/10/16 2000)    Active Problems:   Other cirrhosis of liver (HCC)   Acute on chronic renal failure (HCC)   ARF (acute renal failure) (HCC)   Colitis   Hypertension  COPD (chronic obstructive pulmonary disease) (HCC)   Nephrolithiasis   Depression   Thrombocytopenia (HCC)   Morbid obesity (HCC)   CKD (chronic kidney disease) stage 4, GFR 15-29 ml/min (HCC)   Hypotension   Acute renal failure (ARF) (HCC)   CHF (congestive heart failure) ([place])   Rectal bleeding    Time spent: 35 minutes    [place] [facility] Pager [phone]. If 7PM-7AM, please contact night-coverage at [email or web address], password [facility] 08/24/2016, 8:47 AM  LOS: 22 days

## 2016-08-24 NOTE — Progress Notes (Signed)
  [facility] Progress Note    Assessment/ Plan:   1. Oliguric AKI on CKD 3-4- thought to be secondary to ischemic ATN. UOP improving. Cr rising only minimally. - TDC placed 4/17 - Will hold off on HD again today and re-assess tomorrow 2. Anemia- stable - Continue Arasnesp 3. T2DM- well-controlled - Per primary team 4. Massive obesity,NASH 5. Cirrhosis 6. L GI Bleed - GI following  Subjective:    Patient states she is doing well today. She has been urinating more over the last 24 hours than previous days. She denies any lower extremity edema, shortness of breath, or chest pain.   Objective:   BP (!) 115/53 (BP Location: Right Arm)   Pulse 79   Temp 98.6 F (37 C) (Oral)   Resp 18   Ht '5\' 4"'$  (1.626 m)   Wt 257 lb 11.2 oz (116.9 kg)   SpO2 97%   BMI 44.23 kg/m   Intake/Output Summary (Last 24 hours) at 08/24/16 1016 Last data filed at 08/24/16 0853  Gross per 24 hour  Intake              702 ml  Output              800 ml  Net              -98 ml   Weight change: -11.2 oz (-0.318 kg)  Physical Exam: Gen: Laying in bed, in NAD CVS: RRR Resp: CTAB Abd: +BS, soft, non-tender, non-distended Ext: very minimal pitting edema in the lower extremities  Imaging: No results found.  Labs: BMET  Recent Labs Lab 08/18/16 0351 08/19/16 0338 08/20/16 0440 08/21/16 0436 08/22/16 0247 08/23/16 0404 08/24/16 0552  NA 130* 134* 134* 134* 134* 133* 135  K 3.6 4.2 4.2 4.2 3.5 3.7 3.7  CL 98* 102 102 104 97* 98* 102  CO2 '24 28 24 '$ 21* '28 26 23  '$ GLUCOSE 81 79 72 68 75 66 60*  BUN 21* 12 20 25* '8 12 18  '$ CREATININE 4.48* 3.07* 4.00* 4.21* 2.23* 3.21* 3.51*  CALCIUM 8.6* 8.4* 8.6* 8.8* 8.3* 8.7* 8.7*  PHOS 3.8 3.4 3.8 4.5 2.8 4.0 4.0   CBC  Recent Labs Lab 08/21/16 0436 08/22/16 0247 08/23/16 0404 08/24/16 0552  WBC 3.6* 4.6 3.4* 3.4*  NEUTROABS  --   --  0.9*  --   HGB 9.3* 10.5* 9.6* 10.5*  HCT 27.6* 31.9* 28.6* 32.1*  MCV 93.6 93.0 94.4 95.0   PLT 70* 72* 65* 67*    Medications:    . [START ON 08/25/2016] darbepoetin (ARANESP) injection - NON-DIALYSIS  200 mcg Subcutaneous Q Sat-1800  . furosemide  20 mg Intravenous Once  . Gerhardt's butt cream   Topical TID  . lactulose  20 g Oral BID WC  . midodrine  10 mg Oral TID WC  . pantoprazole  40 mg Oral BID  . rifaximin  550 mg Oral BID  . sodium chloride flush  10-40 mL Intracatheter Q12H  . venlafaxine XR  75 mg Oral Daily      Hyman Bible, MD PGY-2 Family Medicine Resident 08/24/2016, 10:16 AM

## 2016-08-25 DIAGNOSIS — F329 Major depressive disorder, single episode, unspecified: Secondary | ICD-10-CM

## 2016-08-25 LAB — RENAL FUNCTION PANEL
ALBUMIN: 2.8 g/dL — AB (ref 3.5–5.0)
Anion gap: 10 (ref 5–15)
BUN: 20 mg/dL (ref 6–20)
CHLORIDE: 102 mmol/L (ref 101–111)
CO2: 24 mmol/L (ref 22–32)
CREATININE: 3.39 mg/dL — AB (ref 0.44–1.00)
Calcium: 8.9 mg/dL (ref 8.9–10.3)
GFR, EST AFRICAN AMERICAN: 16 mL/min — AB (ref >60)
GFR, EST NON AFRICAN AMERICAN: 14 mL/min — AB (ref >60)
Glucose, Bld: 63 mg/dL — ABNORMAL LOW (ref 65–99)
PHOSPHORUS: 4.5 mg/dL (ref 2.5–4.6)
POTASSIUM: 3.9 mmol/L (ref 3.5–5.1)
Sodium: 136 mmol/L (ref 135–145)

## 2016-08-25 NOTE — Progress Notes (Signed)
[facility] PROGRESS NOTE  Loretta [name] XLK:[id number] DOB: 11/16/1955 DOA: 08/02/2016 PCP: Cyndy Freeze, MD   Hospital course: HPI: Loretta [name] a 61 y.o.femalewith medical history significant of liver cirrhosis secondary to NASH, history of edema treated with Lasix. Presenting today after patient was found to have elevated serum creatinine level at her primary care's office. Patient was recently treated for colitis of which she was sent home with Cipro and Flagyl. She completed 12 out of the 14 days of treatment. She still has some abdominal discomfort. Otherwise patient reports no new complaints. Patient denies any fevers.  61 yo MO(273lbs) hx of gastric bypass, non acholic cirrhosis , acute on chronic renal dz, not a candidate for liver tx due to obesity, who is moved to ICU for HD and HD cath placement. PCCM asked to follow in ICU. She is awake and alert and in NAD. RIJ HD catheter was initially placed and HD started but with slow renal recovery patient is going to require longer term HD, Tunneled HD cath was placed on 4/17.      Assessment/Plan:  Acute on chronic kidney disease Stage 3: Oliguric AKI, -Pt still dialysis dependent, nephrology ordered [facility] with IR for 4/16 - Urine output improving and 725 mL over the past 24 hours. No significant change in renal function over the past 24 hours which seems to be plateauing. -Continue midodrine -DC Phenylphrine, has not needed. -Nephrology following for HD needs -RIJ catheter removed 4/14 -Tunneled HD cath placed 4/17 - Per nephrology.  COPD without acute exacerbation: Not on home therapy. PRN bronchodilators  OSA: CPAP as needed.  Hematochezia/LGIB - GI consult appreciated. Likely hemorrhoidal in nature. Bleeding improving. Hemoglobin currently at 10.5. She was taken off subcut heparin and given vit K.  They are not planning colonoscopy at this time and possible flexible sigmoidoscopy, continue supportive care.  Hg  has been stable to improved.   follow H&H.     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 Undergoing transplant workup at [facility], however not felt to be a candidate due to obesity at this time.    Chronic normocytic anemia: Baseline Hb 8 and has been stable post recent transfusion. Hemoglobin currently at 10.5.  Fall in hospital - Xrays of both knees, no acute findings.  PT following.  Thrombocytopenia: in setting of CLD - Platelets slowly rising and seem to be stabilizing, off heparin  Depression and anxiety: Continue venlafaxine and tramadol  Headache: Continue tramadol and Flexeril.  Pruritus: Loratadine, hydroxyzine as needed. Increased atarax to 25 mg TID prn.    Code Status: Full Family Communication: Updated patient and family at bedside. Disposition Plan: Home when improved clinically, renal function improving back to baseline and per nephrology.   Consultants:  Nephrology: Dr. Marval Regal 08/02/2016  PCCM: Dr. Chase Caller 08/04/2016  Gastroenterology: Dr. Ardis Hughs 08/16/2016  Interventional radiology Dr. Geroge Baseman 08/20/2016  Procedures:  Plain films of the knees 08/16/2016  Chest x-ray 08/04/2016, 08/06/2016, 08/15/2016  Antibiotics:  IV ciprofloxacin 08/05/2016>>> 08/06/2016  Oral Flagyl  08/05/2016>>>>> 08/06/2016    HPI/Subjective: Patient sitting up in bed watching television. Patient denies any shortness of breath. No chest pain. Patient states has been having urine output. Patient denies any further bleeding.   Objective: Vitals:   08/24/16 2315 08/25/16 0617  BP: (!) 148/68 (!) 122/51  Pulse: 84 88  Resp: 17 17  Temp: 97.9 F (36.6 C) 98.6 F (37 C)    Intake/Output Summary (Last 24 hours) at 08/25/16 1301 Last  data filed at 08/25/16 0618  Gross per 24 hour  Intake              180 ml  Output              425 ml  Net             -245 ml   Filed Weights   08/23/16 0618 08/24/16 0605 08/25/16  0617  Weight: 117.2 kg (258 lb 6.4 oz) 116.9 kg (257 lb 11.2 oz) 116.6 kg (257 lb 1.6 oz)    Exam:   General:  NAD  Cardiovascular: RRR  Respiratory: CTAB  Abdomen: Soft, nontender, nondistended, positive bowel sounds.  Musculoskeletal: No clubbing cyanosis or edema.  Data Reviewed: Basic Metabolic Panel:  Recent Labs Lab 08/19/16 0338 08/20/16 0440 08/21/16 0436 08/22/16 0247 08/23/16 0404 08/24/16 0552 08/25/16 0608  NA 134* 134* 134* 134* 133* 135 136  K 4.2 4.2 4.2 3.5 3.7 3.7 3.9  CL 102 102 104 97* 98* 102 102  CO2 28 24 21* '28 26 23 24  '$ GLUCOSE 79 72 68 75 66 60* 63*  BUN 12 20 25* '8 12 18 20  '$ CREATININE 3.07* 4.00* 4.21* 2.23* 3.21* 3.51* 3.39*  CALCIUM 8.4* 8.6* 8.8* 8.3* 8.7* 8.7* 8.9  MG 2.3 2.4  --   --   --   --   --   PHOS 3.4 3.8 4.5 2.8 4.0 4.0 4.5   Liver Function Tests:  Recent Labs Lab 08/21/16 0436 08/22/16 0247 08/23/16 0404 08/24/16 0552 08/25/16 0608  ALBUMIN 2.7* 3.1* 2.7* 2.6* 2.8*   No results for input(s): LIPASE, AMYLASE in the last 168 hours. No results for input(s): AMMONIA in the last 168 hours. CBC:  Recent Labs Lab 08/20/16 0440 08/21/16 0436 08/22/16 0247 08/23/16 0404 08/24/16 0552  WBC 5.0 3.6* 4.6 3.4* 3.4*  NEUTROABS  --   --   --  0.9*  --   HGB 8.9* 9.3* 10.5* 9.6* 10.5*  HCT 27.0* 27.6* 31.9* 28.6* 32.1*  MCV 92.2 93.6 93.0 94.4 95.0  PLT 71* 70* 72* 65* 67*   Cardiac Enzymes: No results for input(s): CKTOTAL, CKMB, CKMBINDEX, TROPONINI in the last 168 hours. BNP (last 3 results) No results for input(s): BNP in the last 8760 hours.  ProBNP (last 3 results) No results for input(s): PROBNP in the last 8760 hours.  CBG: No results for input(s): GLUCAP in the last 168 hours.  No results found for this or any previous visit (from the past 240 hour(s)).   Studies: No results found.  Scheduled Meds: . darbepoetin (ARANESP) injection - NON-DIALYSIS  200 mcg Subcutaneous Q Sat-1800  . furosemide  20  mg Intravenous Once  . Gerhardt's butt cream   Topical TID  . lactulose  20 g Oral BID WC  . midodrine  10 mg Oral TID WC  . pantoprazole  40 mg Oral BID  . rifaximin  550 mg Oral BID  . sodium chloride flush  10-40 mL Intracatheter Q12H  . venlafaxine XR  75 mg Oral Daily   Continuous Infusions: . sodium chloride    . sodium chloride    . sodium chloride    . sodium chloride    . phenylephrine (NEO-SYNEPHRINE) Adult infusion 0 mcg/min (08/10/16 2000)    Active Problems:   Other cirrhosis of liver (HCC)   Acute on chronic renal failure (HCC)   ARF (acute renal failure) (HCC)   Colitis   Hypertension   COPD (chronic  obstructive pulmonary disease) (HCC)   Nephrolithiasis   Depression   Thrombocytopenia (HCC)   Morbid obesity ([place])   CKD (chronic kidney disease) stage 4, GFR 15-29 ml/min (HCC)   Hypotension   Acute renal failure (ARF) (HCC)   CHF (congestive heart failure) ([place])   Rectal bleeding    Time spent: 35 minutes    [facility] Pager [phone]. If 7PM-7AM, please contact night-coverage at [email or web address], password [facility] 08/25/2016, 1:01 PM  LOS: 23 days

## 2016-08-25 NOTE — Progress Notes (Signed)
  [facility] Progress Note    Assessment/ Plan:   1. Oliguric AKI on CKD 3-4- thought to be secondary to ischemic ATN. UOP improving. Cr rising only minimally. - [place] placed 4/17 - holding off on HD today - If Cr continues to trend downward, will remove TDC and she will be able to leave hospital off dialysis  2. Anemia- stable - Continue Arasnesp  3. T2DM- well-controlled - Per primary team  4. Cirrhosis secondary to NASH- hepatologist is at [facility]  5. L GI Bleed - GI following- this has resolved   Subjective:    She is feeling well and has no complaints   Objective:   BP (!) 122/51 (BP Location: Left Arm)   Pulse 88   Temp 98.6 F (37 C) (Oral)   Resp 17   Ht '5\' 4"'$  (1.626 m)   Wt 116.6 kg (257 lb 1.6 oz)   SpO2 97%   BMI 44.13 kg/m   Intake/Output Summary (Last 24 hours) at 08/25/16 0949 Last data filed at 08/25/16 6606  Gross per 24 hour  Intake              180 ml  Output              525 ml  Net             -345 ml   Weight change: -0.272 kg (-9.6 oz)  Physical Exam: Gen: sitting in bed, family at bedside CVS: RRR Resp: CTAB Abd: +BS, soft, non-tender, non-distended Ext: very minimal pitting edema in the lower extremities  Imaging: No results found.  Labs: BMET  Recent Labs Lab 08/19/16 0338 08/20/16 0440 08/21/16 0436 08/22/16 0247 08/23/16 0404 08/24/16 0552 08/25/16 0608  NA 134* 134* 134* 134* 133* 135 136  K 4.2 4.2 4.2 3.5 3.7 3.7 3.9  CL 102 102 104 97* 98* 102 102  CO2 28 24 21* '28 26 23 24  '$ GLUCOSE 79 72 68 75 66 60* 63*  BUN 12 20 25* '8 12 18 20  '$ CREATININE 3.07* 4.00* 4.21* 2.23* 3.21* 3.51* 3.39*  CALCIUM 8.4* 8.6* 8.8* 8.3* 8.7* 8.7* 8.9  PHOS 3.4 3.8 4.5 2.8 4.0 4.0 4.5   CBC  Recent Labs Lab 08/21/16 0436 08/22/16 0247 08/23/16 0404 08/24/16 0552  WBC 3.6* 4.6 3.4* 3.4*  NEUTROABS  --   --  0.9*  --   HGB 9.3* 10.5* 9.6* 10.5*  HCT 27.6* 31.9* 28.6* 32.1*  MCV 93.6 93.0 94.4 95.0  PLT 70* 72*  65* 67*    Medications:    . darbepoetin (ARANESP) injection - NON-DIALYSIS  200 mcg Subcutaneous Q Sat-1800  . furosemide  20 mg Intravenous Once  . Gerhardt's butt cream   Topical TID  . lactulose  20 g Oral BID WC  . midodrine  10 mg Oral TID WC  . pantoprazole  40 mg Oral BID  . rifaximin  550 mg Oral BID  . sodium chloride flush  10-40 mL Intracatheter Q12H  . venlafaxine XR  75 mg Oral Daily      Loretta Lips MD [facility] 08/25/2016, 9:49 AM

## 2016-08-26 LAB — CBC WITH DIFFERENTIAL/PLATELET
BASOS PCT: 2 %
Basophils Absolute: 0.1 10*3/uL (ref 0.0–0.1)
EOS ABS: 0.3 10*3/uL (ref 0.0–0.7)
Eosinophils Relative: 9 %
HCT: 29.3 % — ABNORMAL LOW (ref 36.0–46.0)
Hemoglobin: 9.6 g/dL — ABNORMAL LOW (ref 12.0–15.0)
Lymphocytes Relative: 40 %
Lymphs Abs: 1.1 10*3/uL (ref 0.7–4.0)
MCH: 31 pg (ref 26.0–34.0)
MCHC: 32.8 g/dL (ref 30.0–36.0)
MCV: 94.5 fL (ref 78.0–100.0)
MONO ABS: 0.7 10*3/uL (ref 0.1–1.0)
Monocytes Relative: 22 %
NEUTROS PCT: 27 %
Neutro Abs: 0.8 10*3/uL — ABNORMAL LOW (ref 1.7–7.7)
PLATELETS: 83 10*3/uL — AB (ref 150–400)
RBC: 3.1 MIL/uL — ABNORMAL LOW (ref 3.87–5.11)
RDW: 24.7 % — ABNORMAL HIGH (ref 11.5–15.5)
WBC: 3 10*3/uL — AB (ref 4.0–10.5)

## 2016-08-26 LAB — RENAL FUNCTION PANEL
ALBUMIN: 2.6 g/dL — AB (ref 3.5–5.0)
ANION GAP: 10 (ref 5–15)
BUN: 24 mg/dL — ABNORMAL HIGH (ref 6–20)
CALCIUM: 8.7 mg/dL — AB (ref 8.9–10.3)
CO2: 23 mmol/L (ref 22–32)
CREATININE: 3.33 mg/dL — AB (ref 0.44–1.00)
Chloride: 107 mmol/L (ref 101–111)
GFR calc Af Amer: 16 mL/min — ABNORMAL LOW (ref >60)
GFR, EST NON AFRICAN AMERICAN: 14 mL/min — AB (ref >60)
Glucose, Bld: 63 mg/dL — ABNORMAL LOW (ref 65–99)
PHOSPHORUS: 3.8 mg/dL (ref 2.5–4.6)
POTASSIUM: 3.7 mmol/L (ref 3.5–5.1)
SODIUM: 140 mmol/L (ref 135–145)

## 2016-08-26 NOTE — Progress Notes (Signed)
[facility] cannot take out tunneled HD catheter today. Patient was given a choice of staying tonight and Dr. Earleen Newport will take out Monday or can be removed on an outpatient basis in about a week. Patient has elected to stay and have catheter removed in the morning. Notified Dr. Earleen Newport of choice.

## 2016-08-26 NOTE — Progress Notes (Signed)
  [facility] Progress Note    Assessment/ Plan:   1. Oliguric AKI on CKD 3-4- thought to be secondary to ischemic ATN. UOP improving. Cr continues to down trend. - Okay to discharge from a renal standpoint. Patient no longer requiring HD. - Order placed for [facility] to be removed by IR. Needs to be removed prior to discharge - Needs to follow-up in Nephrology clinic in 1-2 weeks. Dr. Hollie Salk will get this scheduled on Monday.  2. Anemia- stable - Continue Arasnesp  3. T2DM- well-controlled - Per primary team  4. Cirrhosis secondary to NASH- hepatologist is at [facility]  5. L GI Bleed - GI following- this has resolved   Subjective:    Doing well this morning. Ready to be discharged.   Objective:   BP (!) 122/50 (BP Location: Left Arm)   Pulse 92   Temp 98.7 F (37.1 C) (Oral)   Resp 17   Ht '5\' 4"'$  (1.626 m)   Wt 257 lb 1.6 oz (116.6 kg)   SpO2 96%   BMI 44.13 kg/m   Intake/Output Summary (Last 24 hours) at 08/26/16 3546 Last data filed at 08/25/16 2200  Gross per 24 hour  Intake              222 ml  Output              200 ml  Net               22 ml   Weight change:   Physical Exam: Gen: well-appearing, laying in bed, in NAD CVS: RRR Resp: CTAB Abd: +BS, soft, non-tender, non-distended Ext: very minimal pitting edema in the lower extremities  Imaging: No results found.  Labs: BMET  Recent Labs Lab 08/20/16 0440 08/21/16 0436 08/22/16 0247 08/23/16 0404 08/24/16 0552 08/25/16 0608  NA 134* 134* 134* 133* 135 136  K 4.2 4.2 3.5 3.7 3.7 3.9  CL 102 104 97* 98* 102 102  CO2 24 21* '28 26 23 24  '$ GLUCOSE 72 68 75 66 60* 63*  BUN 20 25* '8 12 18 20  '$ CREATININE 4.00* 4.21* 2.23* 3.21* 3.51* 3.39*  CALCIUM 8.6* 8.8* 8.3* 8.7* 8.7* 8.9  PHOS 3.8 4.5 2.8 4.0 4.0 4.5   CBC  Recent Labs Lab 08/22/16 0247 08/23/16 0404 08/24/16 0552 08/26/16 0539  WBC 4.6 3.4* 3.4* 3.0*  NEUTROABS  --  0.9*  --  PENDING  HGB 10.5* 9.6* 10.5* 9.6*  HCT 31.9*  28.6* 32.1* 29.3*  MCV 93.0 94.4 95.0 94.5  PLT 72* 65* 67* 83*    Medications:    . darbepoetin (ARANESP) injection - NON-DIALYSIS  200 mcg Subcutaneous Q Sat-1800  . furosemide  20 mg Intravenous Once  . Gerhardt's butt cream   Topical TID  . lactulose  20 g Oral BID WC  . midodrine  10 mg Oral TID WC  . pantoprazole  40 mg Oral BID  . rifaximin  550 mg Oral BID  . sodium chloride flush  10-40 mL Intracatheter Q12H  . venlafaxine XR  75 mg Oral Daily      Hyman Bible, MD PGY-2 Family Medicine Resident 08/26/2016, 7:23 AM

## 2016-08-26 NOTE — Progress Notes (Signed)
[facility] PROGRESS NOTE  Ryka Beighley INO:[id number] DOB: Sep 30, 1955 DOA: 08/02/2016 PCP: Cyndy Freeze, MD   Hospital course: HPI: Analyce Tavares a 61 y.o.femalewith medical history significant of liver cirrhosis secondary to NASH, history of edema treated with Lasix. Presenting today after patient was found to have elevated serum creatinine level at her primary care's office. Patient was recently treated for colitis of which she was sent home with Cipro and Flagyl. She completed 12 out of the 14 days of treatment. She still has some abdominal discomfort. Otherwise patient reports no new complaints. Patient denies any fevers.  61 yo MO(273lbs) hx of gastric bypass, non acholic cirrhosis , acute on chronic renal dz, not a candidate for liver tx due to obesity, who is moved to ICU for HD and HD cath placement. PCCM asked to follow in ICU. She is awake and alert and in NAD. RIJ HD catheter was initially placed and HD started but with slow renal recovery patient is going to require longer term HD, Tunneled HD cath was placed on 4/17.      Assessment/Plan:  Acute on chronic kidney disease Stage 3: Oliguric AKI, -Pt still dialysis dependent, nephrology ordered [facility] with IR for 4/16 - Urine output improving. No significant change in renal function over the past 24 hours which seems to be plateauing. -Continue midodrine -DC Phenylphrine, has not needed. -Nephrology following for HD needs -RIJ catheter removed 4/14 -Tunneled HD cath placed 4/17  - Tunneled HD catheter to be removed per nephrology. - Patient to follow-up with nephrology in the outpatient setting. - Per nephrology.  COPD without acute exacerbation: Not on home therapy. PRN bronchodilators  OSA: CPAP as needed.  Hematochezia/LGIB - GI consult appreciated. Likely hemorrhoidal in nature. Bleeding improving. Hemoglobin currently at 10.5. She was taken off subcut heparin and given vit K.  They are not planning  colonoscopy at this time and possible flexible sigmoidoscopy, continue supportive care.  Hg has been stable to improved.   follow H&H.     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 Undergoing transplant workup at [facility], however not felt to be a candidate due to obesity at this time.    Chronic normocytic anemia: Baseline Hb 8 and has been stable post recent transfusion. Hemoglobin currently at 9.6.  Fall in hospital - Xrays of both knees, no acute findings.  PT following.  Thrombocytopenia: in setting of CLD - Platelets slowly rising and seem to be stabilizing, off heparin  Depression and anxiety: Continue venlafaxine and tramadol  Headache: Continue tramadol and Flexeril.  Pruritus: Loratadine, hydroxyzine as needed. Increased atarax to 25 mg TID prn.    Code Status: Full Family Communication: Updated patient and family at bedside. Disposition Plan: Home when tunneled HD cathether has been removed and renal function stable hopefully tomorrow.    Consultants:  Nephrology: Dr. Marval Regal 08/02/2016  PCCM: Dr. Chase Caller 08/04/2016  Gastroenterology: Dr. Ardis Hughs 08/16/2016  Interventional radiology Dr. Geroge Baseman 08/20/2016  Procedures:  Plain films of the knees 08/16/2016  Chest x-ray 08/04/2016, 08/06/2016, 08/15/2016  Antibiotics:  IV ciprofloxacin 08/05/2016>>> 08/06/2016  Oral Flagyl  08/05/2016>>>>> 08/06/2016    HPI/Subjective: Patient sleeping. Patient easily arousable. No chest pain. No shortness of breath. Patient with good urine output. No bleeding.   Objective: Vitals:   08/25/16 2116 08/26/16 0500  BP: (!) 155/65 (!) 122/50  Pulse: 94 92  Resp: 17 17  Temp: 98.3 F (36.8 C) 98.7 F (37.1 C)    Intake/Output Summary (Last 24  hours) at 08/26/16 1238 Last data filed at 08/25/16 2200  Gross per 24 hour  Intake              222 ml  Output              200 ml  Net               22 ml   Filed Weights    08/24/16 0605 08/25/16 0617 08/26/16 0831  Weight: 116.9 kg (257 lb 11.2 oz) 116.6 kg (257 lb 1.6 oz) 115.5 kg (254 lb 10.1 oz)    Exam:   General:  NAD  Cardiovascular: RRR  Respiratory: CTAB  Abdomen: Soft, nontender, nondistended, positive bowel sounds.  Musculoskeletal: No clubbing cyanosis or edema.  Data Reviewed: Basic Metabolic Panel:  Recent Labs Lab 08/20/16 0440  08/22/16 0247 08/23/16 0404 08/24/16 0552 08/25/16 0608 08/26/16 0539  NA 134*  < > 134* 133* 135 136 140  K 4.2  < > 3.5 3.7 3.7 3.9 3.7  CL 102  < > 97* 98* 102 102 107  CO2 24  < > '28 26 23 24 23  '$ GLUCOSE 72  < > 75 66 60* 63* 63*  BUN 20  < > '8 12 18 20 '$ 24*  CREATININE 4.00*  < > 2.23* 3.21* 3.51* 3.39* 3.33*  CALCIUM 8.6*  < > 8.3* 8.7* 8.7* 8.9 8.7*  MG 2.4  --   --   --   --   --   --   PHOS 3.8  < > 2.8 4.0 4.0 4.5 3.8  < > = values in this interval not displayed. Liver Function Tests:  Recent Labs Lab 08/22/16 0247 08/23/16 0404 08/24/16 0552 08/25/16 0608 08/26/16 0539  ALBUMIN 3.1* 2.7* 2.6* 2.8* 2.6*   No results for input(s): LIPASE, AMYLASE in the last 168 hours. No results for input(s): AMMONIA in the last 168 hours. CBC:  Recent Labs Lab 08/21/16 0436 08/22/16 0247 08/23/16 0404 08/24/16 0552 08/26/16 0539  WBC 3.6* 4.6 3.4* 3.4* 3.0*  NEUTROABS  --   --  0.9*  --  0.8*  HGB 9.3* 10.5* 9.6* 10.5* 9.6*  HCT 27.6* 31.9* 28.6* 32.1* 29.3*  MCV 93.6 93.0 94.4 95.0 94.5  PLT 70* 72* 65* 67* 83*   Cardiac Enzymes: No results for input(s): CKTOTAL, CKMB, CKMBINDEX, TROPONINI in the last 168 hours. BNP (last 3 results) No results for input(s): BNP in the last 8760 hours.  ProBNP (last 3 results) No results for input(s): PROBNP in the last 8760 hours.  CBG: No results for input(s): GLUCAP in the last 168 hours.  No results found for this or any previous visit (from the past 240 hour(s)).   Studies: No results found.  Scheduled Meds: . darbepoetin (ARANESP)  injection - NON-DIALYSIS  200 mcg Subcutaneous Q Sat-1800  . furosemide  20 mg Intravenous Once  . Gerhardt's butt cream   Topical TID  . lactulose  20 g Oral BID WC  . midodrine  10 mg Oral TID WC  . pantoprazole  40 mg Oral BID  . rifaximin  550 mg Oral BID  . sodium chloride flush  10-40 mL Intracatheter Q12H  . venlafaxine XR  75 mg Oral Daily   Continuous Infusions: . sodium chloride    . sodium chloride      Active Problems:   Other cirrhosis of liver (HCC)   Acute on chronic renal failure (HCC)   ARF (acute renal failure) ([place])  Colitis   Hypertension   COPD (chronic obstructive pulmonary disease) (HCC)   Nephrolithiasis   Depression   Thrombocytopenia (HCC)   Morbid obesity (HCC)   CKD (chronic kidney disease) stage 4, GFR 15-29 ml/min (HCC)   Hypotension   Acute renal failure (ARF) (HCC)   CHF (congestive heart failure) ([place])   Rectal bleeding    Time spent: 35 minutes    [facility] Pager [phone]. If 7PM-7AM, please contact night-coverage at [email or web address], password [facility] 08/26/2016, 12:38 PM  LOS: 24 days

## 2016-08-27 ENCOUNTER — Encounter (HOSPITAL_COMMUNITY): Payer: Self-pay | Admitting: Diagnostic Radiology

## 2016-08-27 ENCOUNTER — Inpatient Hospital Stay (HOSPITAL_COMMUNITY): Payer: Medicare HMO

## 2016-08-27 HISTORY — PX: IR REMOVAL TUN CV CATH W/O FL: IMG2289

## 2016-08-27 LAB — RENAL FUNCTION PANEL
Albumin: 2.7 g/dL — ABNORMAL LOW (ref 3.5–5.0)
Anion gap: 6 (ref 5–15)
BUN: 26 mg/dL — AB (ref 6–20)
CHLORIDE: 109 mmol/L (ref 101–111)
CO2: 23 mmol/L (ref 22–32)
CREATININE: 3.17 mg/dL — AB (ref 0.44–1.00)
Calcium: 8.6 mg/dL — ABNORMAL LOW (ref 8.9–10.3)
GFR calc Af Amer: 17 mL/min — ABNORMAL LOW (ref >60)
GFR, EST NON AFRICAN AMERICAN: 15 mL/min — AB (ref >60)
Glucose, Bld: 80 mg/dL (ref 65–99)
Phosphorus: 4 mg/dL (ref 2.5–4.6)
Potassium: 3.9 mmol/L (ref 3.5–5.1)
SODIUM: 138 mmol/L (ref 135–145)

## 2016-08-27 MED ORDER — MIDODRINE HCL 10 MG PO TABS: 10.0000 mg | ORAL_TABLET | Freq: Three times a day (TID) | ORAL | 1 refills | Status: AC

## 2016-08-27 MED ORDER — CHLORHEXIDINE GLUCONATE 4 % EX LIQD
CUTANEOUS | Status: AC
  Filled 2016-08-27: qty 15

## 2016-08-27 MED ORDER — HYDROXYZINE HCL 25 MG PO TABS: 25.0000 mg | ORAL_TABLET | Freq: Three times a day (TID) | ORAL | 0 refills | Status: AC | PRN

## 2016-08-27 NOTE — Discharge Summary (Signed)
Physician Discharge Summary  Loretta [name] WUJ:[id number] DOB: May 10, 1955 DOA: 08/02/2016  PCP: Cyndy Freeze, MD  Admit date: 08/02/2016 Discharge date: 08/27/2016  Time spent: 65 minutes  Recommendations for Outpatient Follow-up:  1. Follow-up with Cyndy Freeze, MD in 2 weeks. 2. Follow-up with Dr. Hollie Salk, nephrology, office will call with an appointment to be seen in the next 1-2 weeks. On follow-up patient will need a renal function panel to follow-up on electrolytes and renal function.   Discharge Diagnoses:  Principal Problem:   Acute on chronic renal failure (HCC) Active Problems:   CKD (chronic kidney disease) stage 4, GFR 15-29 ml/min (HCC)   Other cirrhosis of liver (HCC)   Hypotension   ARF (acute renal failure) (HCC)   Colitis   Hypertension   COPD (chronic obstructive pulmonary disease) (HCC)   Nephrolithiasis   Depression   Thrombocytopenia (HCC)   Morbid obesity ([place])   Acute renal failure (ARF) (HCC)   CHF (congestive heart failure) ([place])   Rectal bleeding   Discharge Condition: Stable and improved  Diet recommendation: Heart healthy/1200 mL fluid restriction  Filed Weights   08/24/16 0605 08/25/16 0617 08/26/16 0831  Weight: 116.9 kg (257 lb 11.2 oz) 116.6 kg (257 lb 1.6 oz) 115.5 kg (254 lb 10.1 oz)    History of present illness:  Per Dr Wendee Beavers Loretta [name] is a 61 y.o. female with medical history significant of liver cirrhosis secondary to NASH, history of edema treated with Lasix. Presented after patient was found to have elevated serum creatinine level at her primary care's office. Patient was recently treated for colitis of which she was sent home with Cipro and Flagyl. She completed 12 out of the 14 days of treatment. She still had some abdominal discomfort. Otherwise patient reports no new complaints. Patient denied any fevers   Hospital Course:  Acute on chronic kidney disease Stage 3: Oliguric AKI, - Felt secondary to ischemic ATN. -  Patient was admitted with elevated creatinine of 9.61 with acute on chronic kidney disease stage III - Patient initially admitted to the ICU for hemodialysis and HD cath placement. Patient was followed by the ICU. Patient had a right IJ HD catheter placed and hemodialysis started. - Patient subsequently underwent tunneled HD cath placement on 08/21/2016.  - Patient was seen by nephrology on admission and followed throughout the hospitalization by nephrology.  -Pt still dialysis dependent, nephrology ordered [facility] with IR for 4/16 - Patient subsequently was monitored and had intermittent hemodialysis.  - Urine output  improved slowly on a daily basis and patient was monitored. Patient's creatinine seemed to plateau in the mid threes. Hemodialysis was subsequently held. -Patient was placed on  Midodrine which was continued during the hospitalization and on discharge.  -DC Phenylphrine, has not needed. -Nephrology following for HD needs -RIJ catheter removed 4/14 -Tunneled HD cath placed 4/17  - Tunneled HD catheter  was removed on day of discharge 08/27/2016 print intervention radiology.  - Patient's renal function improved and patient was cleared by nephrology for discharge. Was recommended per nephrology to hold patient's diuretics on discharge as patient is expected to have a post ATN diuresis.  COPD without acute exacerbation: Not on home therapy. PRN bronchodilators  OSA: CPAP as needed.  Hematochezia/LGIB- GI consult appreciated. Likely hemorrhoidal in nature. Bleeding improving. Hemoglobin currently at 10.5. She was taken off subcut heparin and given vit K. They are not planning colonoscopy at this time and possible flexible sigmoidoscopy, continue supportive care. Hg has been stable to improved.  follow  H&H.   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 Undergoing [facility] workup at [facility], however not felt to be a candidate  due to obesity at this time.   Chronic normocytic anemia: Baseline Hb 8and has been stable post recent transfusion. Hemoglobin stabilized at 9.6.  Fall in hospital - can hospitalization patient noted to have complaints of knee pain after a fall. Xrays of both knees, no acute findings. PT following.  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 Heparin was discontinued.  Depression and anxiety: Maintained on venlafaxine and tramadol  Headache: Patient was maintained on tramadol and Flexeril.  Pruritus: Loratadine, hydroxyzine as needed.    Procedures:  Plain films of the knees 08/16/2016  Chest x-ray 08/04/2016, 08/06/2016, 08/15/2016  Consultations:  Nephrology: Dr. Marval Regal 08/02/2016  PCCM: Dr. Chase Caller 08/04/2016  Gastroenterology: Dr. Ardis Hughs 08/16/2016  Interventional radiology Dr. Geroge Baseman 04/16/2  Discharge Exam: Vitals:   08/26/16 2038 08/27/16 0420  BP: (!) 139/53 (!) 124/56  Pulse: 96 91  Resp: 17 18  Temp: 98.2 F (36.8 C) 97.7 F (36.5 C)    General: NAD Cardiovascular: RRR Respiratory: CTAB  Discharge Instructions   Discharge Instructions    Diet - low sodium heart healthy    Complete by:  As directed    1200cc/day fluid restriction.   Increase activity slowly    Complete by:  As directed      Discharge Medication List as of 08/27/2016 12:21 PM    START taking these medications   Details  hydrOXYzine (ATARAX/VISTARIL) 25 MG tablet Take 1 tablet (25 mg total) by mouth 3 (three) times daily as needed for itching., Starting Mon 08/27/2016, Print    midodrine (PROAMATINE) 10 MG tablet Take 1 tablet (10 mg total) by mouth 3 (three) times daily with meals., Starting Mon 08/27/2016, Print      CONTINUE these medications which have NOT CHANGED   Details  docusate sodium (COLACE) 100 MG capsule Take 100 mg by mouth daily as needed for moderate constipation. ,  Historical Med    folic acid (FOLVITE) 1 MG tablet Take 1 mg by mouth daily., Historical Med    KRISTALOSE 20 g packet Take 20 g by mouth 3 (three) times daily. , Starting Mon 11/14/2015, Historical Med    Melatonin 3 MG TABS Take 3 mg by mouth at bedtime., Historical Med    milk thistle 175 MG tablet Take 175 mg by mouth 2 (two) times daily., Historical Med    Multiple Vitamin (MULTI-VITAMIN PO) Take 1 tablet by mouth daily., Historical Med    pantoprazole (PROTONIX) 40 MG tablet Take 1 tablet (40 mg total) by mouth 2 (two) times daily before a meal., Starting Sat 01/21/2016, Print    pramipexole (MIRAPEX) 0.25 MG tablet Take 0.25 mg by mouth 2 (two) times daily., Starting Fri 11/18/2015, Historical Med    Probiotic Product (PROBIOTIC PO) Take 1 tablet by mouth daily., Historical Med    venlafaxine XR (EFFEXOR-XR) 75 MG 24 hr capsule Take 75 mg by mouth daily with breakfast., Historical Med    VENTOLIN HFA 108 (90 Base) MCG/ACT inhaler Inhale 2 puffs into the lungs every 4 (four) hours as needed for wheezing or shortness of breath. , Starting Wed 07/25/2016, Historical Med    XIFAXAN 550 MG TABS tablet Take 550 mg by mouth 2 (two) times daily., Starting Fri 12/23/2015, Historical Med  STOP taking these medications     furosemide (LASIX) 20 MG tablet      metolazone (ZAROXOLYN) 5 MG tablet      nadolol (CORGARD) 40 MG tablet      spironolactone (ALDACTONE) 50 MG tablet      venlafaxine (EFFEXOR) 25 MG tablet        Allergies  Allergen Reactions  . Codeine Other (See Comments)    Unknown  . Cymbalta [Duloxetine Hcl] Other (See Comments)    Unknown  . Levaquin [Levofloxacin In D5w] Other (See Comments)    Unknown  . Tomato Itching   Follow-up Information    Burkhart,Brian, MD. Schedule an appointment as soon as possible for a visit in 2 week(s).   Specialty:  Family Medicine Contact information: [place] [address] [phone]        Madelon Lips, MD. Schedule an appointment as soon as possible for a visit in 1 week(s).   Specialty:  Nephrology Why:  office will call with outpatient follow up appointment in 1-2 weeks. Contact information: [place]  [address] [phone]            The results of significant diagnostics from this hospitalization (including imaging, microbiology, ancillary and laboratory) are listed below for reference.    Significant Diagnostic Studies: Dg Knee 1-2 Views Left  Result Date: 08/16/2016 CLINICAL DATA:  Recent fall, bilateral knee pain left greater than right EXAM: LEFT KNEE - 1-2 VIEW COMPARISON:  None. FINDINGS: Two views of the left knee submitted. No acute fracture or subluxation. Mild narrowing of medial joint compartment. No joint effusion. IMPRESSION: No acute fracture or subluxation. Mild narrowing of medial joint compartment. Electronically Signed   By: Lahoma Crocker M.D.   On: 08/16/2016 12:10   Dg Knee 1-2 Views Right  Result Date: 08/16/2016 CLINICAL DATA:  Recent fall. Bilateral knee discomfort. No swelling or bruising. EXAM: RIGHT KNEE - 1-2 VIEW COMPARISON:  None. FINDINGS: There is no joint effusion. No fracture or subluxation identified. Sharpening of the tibial spines identified. There is mild medial and patellofemoral compartment narrowing. IMPRESSION: 1. No acute findings. 2. Osteoarthritis. Electronically Signed   By: Kerby Moors M.D.   On: 08/16/2016 12:19   Ir Fluoro Guide Cv Line Left  Result Date: 08/21/2016 INDICATION: Renal failure EXAM: TUNNELED DIALYSIS CATHETER PLACEMENT, ULTRASOUND GUIDANCE FOR VASCULAR ACCESS MEDICATIONS: Ancef; The antibiotic was administered within an appropriate time interval prior to skin puncture. ANESTHESIA/SEDATION: Versed 1 mg IV; Fentanyl 50 mcg IV; Moderate Sedation Time:  15 The patient was continuously monitored during the procedure by the interventional radiology nurse under my direct supervision. FLUOROSCOPY TIME:   Fluoroscopy Time:  minutes 24 seconds (4 mGy). COMPLICATIONS: None immediate. PROCEDURE: Informed written consent was obtained from the patient after a thorough discussion of the procedural risks, benefits and alternatives. All questions were addressed. Maximal Sterile Barrier Technique was utilized including caps, mask, sterile gowns, sterile gloves, sterile drape, hand hygiene and skin antiseptic. A timeout was performed prior to the initiation of the procedure. The left neck was prepped with ChloraPrep in a sterile fashion, and a sterile drape was applied covering the operative field. A sterile gown and sterile gloves were used for the procedure. 1% lidocaine into the skin and subcutaneous tissue. The left jugular vein was noted to be patent initially with ultrasound. Under sonographic guidance, a micropuncture needle was inserted into the left IJ vein (Ultrasound and fluoroscopic image documentation was performed). It was removed over  an 018 wire which was up-sized to an Amplatz. This was advanced into the IVC. A small incision was made in the right upper chest. The tunneling device was utilized to advance the 23 centimeter tip to cuff catheter from the chest incision and out the neck incision. A peel-away sheath was advanced over the Amplatz wire. The leading edge of the catheter was then advanced through the peel-away sheath. The peel-away sheath was removed. It was flushed and instilled with heparin. The chest incision was closed with a 0 Prolene pursestring stitch. The neck incision was closed with a 4-0 Vicryl subcuticular stitch. IMPRESSION: Successful left IJ vein tunneled dialysis catheter with its tip in the right atrium. Electronically Signed   By: Marybelle Killings M.D.   On: 08/21/2016 15:45   Ir Removal Tun Cv Cath W/o Fl  Result Date: 08/27/2016 INDICATION: 61 year old with acute on chronic renal disease. Patient no longer needs a dialysis catheter. EXAM: REMOVAL TUNNELED CENTRAL VENOUS CATHETER  MEDICATIONS: None ANESTHESIA/SEDATION: None FLUOROSCOPY TIME:  None COMPLICATIONS: None immediate. PROCEDURE: The procedure was explained to the patient. The risks and benefits of the procedure were discussed and the patient's questions were addressed. Informed consent was obtained from the patient. Left chest catheter suture was removed. The catheter was easily removed with mild traction. The entire catheter was removed. Bandage placed over the catheter exit site. IMPRESSION: Successful catheter removal as described above. Electronically Signed   By: Markus Daft M.D.   On: 08/27/2016 11:23   Ir US Guide Vasc Access Left  Result Date: 08/21/2016 INDICATION: Renal failure EXAM: TUNNELED DIALYSIS CATHETER PLACEMENT, ULTRASOUND GUIDANCE FOR VASCULAR ACCESS MEDICATIONS: Ancef; The antibiotic was administered within an appropriate time interval prior to skin puncture. ANESTHESIA/SEDATION: Versed 1 mg IV; Fentanyl 50 mcg IV; Moderate Sedation Time:  15 The patient was continuously monitored during the procedure by the interventional radiology nurse under my direct supervision. FLUOROSCOPY TIME:  Fluoroscopy Time:  minutes 24 seconds (4 mGy). COMPLICATIONS: None immediate. PROCEDURE: Informed written consent was obtained from the patient after a thorough discussion of the procedural risks, benefits and alternatives. All questions were addressed. Maximal Sterile Barrier Technique was utilized including caps, mask, sterile gowns, sterile gloves, sterile drape, hand hygiene and skin antiseptic. A timeout was performed prior to the initiation of the procedure. The left neck was prepped with ChloraPrep in a sterile fashion, and a sterile drape was applied covering the operative field. A sterile gown and sterile gloves were used for the procedure. 1% lidocaine into the skin and subcutaneous tissue. The left jugular vein was noted to be patent initially with ultrasound. Under sonographic guidance, a micropuncture needle was  inserted into the left IJ vein (Ultrasound and fluoroscopic image documentation was performed). It was removed over an 018 wire which was up-sized to an Amplatz. This was advanced into the IVC. A small incision was made in the right upper chest. The tunneling device was utilized to advance the 23 centimeter tip to cuff catheter from the chest incision and out the neck incision. A peel-away sheath was advanced over the Amplatz wire. The leading edge of the catheter was then advanced through the peel-away sheath. The peel-away sheath was removed. It was flushed and instilled with heparin. The chest incision was closed with a 0 Prolene pursestring stitch. The neck incision was closed with a 4-0 Vicryl subcuticular stitch. IMPRESSION: Successful left IJ vein tunneled dialysis catheter with its tip in the right atrium. Electronically Signed   By: Marybelle Killings  M.D.   On: 08/21/2016 15:45   Dg Chest Port 1 View  Result Date: 08/15/2016 CLINICAL DATA:  61 y/o  F; congestive heart failure. EXAM: PORTABLE CHEST 1 VIEW COMPARISON:  08/06/2016 chest radiograph FINDINGS: Stable cardiac silhouette. Right central venous catheter tip projects over upper SVC. Interstitial lung markings with improved aeration of lung bases. No airspace consolidation. No pleural effusion or pneumothorax. No acute osseous abnormality is evident. IMPRESSION: Interstitial lung markings probably representing interstitial pulmonary edema. Improved aeration of lung bases. Electronically Signed   By: Kristine Garbe M.D.   On: 08/15/2016 18:26   Dg Chest Port 1 View  Result Date: 08/06/2016 CLINICAL DATA:  Acute renal failure EXAM: PORTABLE CHEST 1 VIEW COMPARISON:  08/04/2016 FINDINGS: Right jugular central venous catheter is been pulled back into the jugular vein. Progression of vascular congestion and bilateral airspace disease most compatible with mild edema. No effusion. IMPRESSION: Right jugular catheter has been pulled back with the  tip now in the lower jugular vein Progression of bilateral airspace disease and vascular congestion most likely due to edema versus pneumonia. Electronically Signed   By: Franchot Gallo M.D.   On: 08/06/2016 07:14   Dg Chest Port 1 View  Result Date: 08/04/2016 CLINICAL DATA:  Encounter for central line placement, history hypertension, morbid obesity, COPD, chronic kidney disease EXAM: PORTABLE CHEST 1 VIEW COMPARISON:  Portable exam 1350 hours compared to 05/18/2016 FINDINGS: RIGHT jugular line with tip projecting over SVC. Borderline enlargement of cardiac silhouette. Mediastinal contours and pulmonary vascularity normal. Atelectasis versus infiltrate at medial RIGHT lung base. Remaining lungs clear. No pleural effusion or pneumothorax. Bones demineralized. IMPRESSION: No pneumothorax following central line placement. Atelectasis versus infiltrate in medial RIGHT lower lobe. Electronically Signed   By: Lavonia Dana M.D.   On: 08/04/2016 14:02    Microbiology: No results found for this or any previous visit (from the past 240 hour(s)).   Labs: Basic Metabolic Panel:  Recent Labs Lab 08/23/16 0404 08/24/16 0552 08/25/16 0608 08/26/16 0539 08/27/16 0338  NA 133* 135 136 140 138  K 3.7 3.7 3.9 3.7 3.9  CL 98* 102 102 107 109  CO2 '26 23 24 23 23  '$ GLUCOSE 66 60* 63* 63* 80  BUN '12 18 20 '$ 24* 26*  CREATININE 3.21* 3.51* 3.39* 3.33* 3.17*  CALCIUM 8.7* 8.7* 8.9 8.7* 8.6*  PHOS 4.0 4.0 4.5 3.8 4.0   Liver Function Tests:  Recent Labs Lab 08/23/16 0404 08/24/16 0552 08/25/16 0608 08/26/16 0539 08/27/16 0338  ALBUMIN 2.7* 2.6* 2.8* 2.6* 2.7*   No results for input(s): LIPASE, AMYLASE in the last 168 hours. No results for input(s): AMMONIA in the last 168 hours. CBC:  Recent Labs Lab 08/21/16 0436 08/22/16 0247 08/23/16 0404 08/24/16 0552 08/26/16 0539  WBC 3.6* 4.6 3.4* 3.4* 3.0*  NEUTROABS  --   --  0.9*  --  0.8*  HGB 9.3* 10.5* 9.6* 10.5* 9.6*  HCT 27.6* 31.9* 28.6*  32.1* 29.3*  MCV 93.6 93.0 94.4 95.0 94.5  PLT 70* 72* 65* 67* 83*   Cardiac Enzymes: No results for input(s): CKTOTAL, CKMB, CKMBINDEX, TROPONINI in the last 168 hours. BNP: BNP (last 3 results) No results for input(s): BNP in the last 8760 hours.  ProBNP (last 3 results) No results for input(s): PROBNP in the last 8760 hours.  CBG: No results for input(s): GLUCAP in the last 168 hours.     SignedIrine Seal MD.  [facility] 08/27/2016, 6:21 PM

## 2016-08-27 NOTE — Care Management Note (Signed)
Case Management Note  Patient Details  Name: Loretta [name] MRN: [id number] Date of Birth: 10-Mar-1956  Subjective/Objective:                    Action/Plan:   Expected Discharge Date:                  Expected Discharge Plan:  Home/Self Care  In-House Referral:  NA  Discharge planning Services  CM Consult  Post Acute Care Choice:    Choice offered to:  Patient  DME Arranged:  [facility] Rile rolling (Has RW and Cane at home) DME Agency:  [place]:    [place]:     Status of Service:  Completed, signed off  If discussed at [employer] of Stay Meetings, dates discussed:    Additional Comments:  Loretta Favre, RN 08/27/2016, 10:49 AM

## 2016-08-27 NOTE — Care Management Important Message (Signed)
Important Message  Patient Details  Name: Loretta [name] MRN: [id number] Date of Birth: [birth year]   Medicare Important Message Given:  Yes    [name] 08/27/2016, 3:00 PM

## 2016-08-27 NOTE — Procedures (Signed)
Left chest dialysis catheter was removed with mild traction.  No immediate complication.  Minimal blood loss.

## 2016-08-27 NOTE — Progress Notes (Signed)
Loretta [name] to be D/C'd  per MD order. Discussed with the patient and all questions fully answered.  VSS, Skin clean, dry and intact without evidence of skin break down, no evidence of skin tears noted.  IV catheter discontinued intact. Site without signs and symptoms of complications. Dressing and pressure applied.  An After Visit Summary was printed and given to the patient. Patient received prescription.  D/c education completed with patient/family including follow up instructions, medication list, d/c activities limitations if indicated, with other d/c instructions as indicated by MD - patient able to verbalize understanding, all questions fully answered.   Patient instructed to return to ED, call 911, or call MD for any changes in condition.   Patient to be escorted via [place], and D/C home via private auto.

## 2016-08-30 DIAGNOSIS — N184 Chronic kidney disease, stage 4 (severe): Secondary | ICD-10-CM | POA: Diagnosis not present

## 2016-08-30 DIAGNOSIS — K7460 Unspecified cirrhosis of liver: Secondary | ICD-10-CM | POA: Diagnosis not present

## 2016-08-30 DIAGNOSIS — D696 Thrombocytopenia, unspecified: Secondary | ICD-10-CM | POA: Diagnosis not present

## 2016-08-30 DIAGNOSIS — K760 Fatty (change of) liver, not elsewhere classified: Secondary | ICD-10-CM | POA: Diagnosis not present

## 2016-08-30 DIAGNOSIS — K921 Melena: Secondary | ICD-10-CM | POA: Diagnosis not present

## 2016-08-30 DIAGNOSIS — W19XXXA Unspecified fall, initial encounter: Secondary | ICD-10-CM | POA: Diagnosis not present

## 2016-08-30 DIAGNOSIS — E43 Unspecified severe protein-calorie malnutrition: Secondary | ICD-10-CM | POA: Diagnosis not present

## 2016-08-30 DIAGNOSIS — M25561 Pain in right knee: Secondary | ICD-10-CM | POA: Diagnosis not present

## 2016-08-30 DIAGNOSIS — N170 Acute kidney failure with tubular necrosis: Secondary | ICD-10-CM | POA: Diagnosis not present

## 2016-08-30 DIAGNOSIS — I119 Hypertensive heart disease without heart failure: Secondary | ICD-10-CM | POA: Diagnosis not present

## 2016-09-04 DIAGNOSIS — N179 Acute kidney failure, unspecified: Secondary | ICD-10-CM | POA: Diagnosis not present

## 2016-09-04 DIAGNOSIS — D631 Anemia in chronic kidney disease: Secondary | ICD-10-CM | POA: Diagnosis not present

## 2016-09-07 DIAGNOSIS — H353131 Nonexudative age-related macular degeneration, bilateral, early dry stage: Secondary | ICD-10-CM | POA: Diagnosis not present

## 2016-09-07 DIAGNOSIS — H5213 Myopia, bilateral: Secondary | ICD-10-CM | POA: Diagnosis not present

## 2016-09-07 DIAGNOSIS — H02823 Cysts of right eye, unspecified eyelid: Secondary | ICD-10-CM | POA: Diagnosis not present

## 2016-09-11 DIAGNOSIS — K7581 Nonalcoholic steatohepatitis (NASH): Secondary | ICD-10-CM | POA: Diagnosis not present

## 2016-09-11 DIAGNOSIS — K7460 Unspecified cirrhosis of liver: Secondary | ICD-10-CM | POA: Diagnosis not present

## 2016-09-11 DIAGNOSIS — D649 Anemia, unspecified: Secondary | ICD-10-CM | POA: Diagnosis not present

## 2016-09-11 DIAGNOSIS — K7290 Hepatic failure, unspecified without coma: Secondary | ICD-10-CM | POA: Diagnosis not present

## 2016-09-11 DIAGNOSIS — L03115 Cellulitis of right lower limb: Secondary | ICD-10-CM | POA: Diagnosis not present

## 2016-09-13 DIAGNOSIS — N184 Chronic kidney disease, stage 4 (severe): Secondary | ICD-10-CM | POA: Diagnosis not present

## 2016-09-13 DIAGNOSIS — L03115 Cellulitis of right lower limb: Secondary | ICD-10-CM | POA: Diagnosis not present

## 2016-09-13 DIAGNOSIS — K7460 Unspecified cirrhosis of liver: Secondary | ICD-10-CM | POA: Diagnosis not present

## 2016-09-13 DIAGNOSIS — I119 Hypertensive heart disease without heart failure: Secondary | ICD-10-CM | POA: Diagnosis not present

## 2016-09-20 DIAGNOSIS — K7460 Unspecified cirrhosis of liver: Secondary | ICD-10-CM | POA: Diagnosis not present

## 2016-09-20 DIAGNOSIS — L0390 Cellulitis, unspecified: Secondary | ICD-10-CM | POA: Diagnosis not present

## 2016-09-20 DIAGNOSIS — N184 Chronic kidney disease, stage 4 (severe): Secondary | ICD-10-CM | POA: Diagnosis not present

## 2016-09-20 DIAGNOSIS — K7581 Nonalcoholic steatohepatitis (NASH): Secondary | ICD-10-CM | POA: Diagnosis not present

## 2016-09-25 DIAGNOSIS — E119 Type 2 diabetes mellitus without complications: Secondary | ICD-10-CM | POA: Diagnosis not present

## 2016-09-25 DIAGNOSIS — N184 Chronic kidney disease, stage 4 (severe): Secondary | ICD-10-CM | POA: Diagnosis not present

## 2016-09-26 ENCOUNTER — Other Ambulatory Visit: Payer: Self-pay | Admitting: Nephrology

## 2016-10-04 DIAGNOSIS — H43823 Vitreomacular adhesion, bilateral: Secondary | ICD-10-CM | POA: Diagnosis not present

## 2016-10-04 DIAGNOSIS — H04123 Dry eye syndrome of bilateral lacrimal glands: Secondary | ICD-10-CM | POA: Diagnosis not present

## 2016-10-04 DIAGNOSIS — H353131 Nonexudative age-related macular degeneration, bilateral, early dry stage: Secondary | ICD-10-CM | POA: Diagnosis not present

## 2016-10-05 ENCOUNTER — Other Ambulatory Visit: Payer: Self-pay | Admitting: Nephrology

## 2016-10-05 DIAGNOSIS — M7989 Other specified soft tissue disorders: Secondary | ICD-10-CM

## 2016-10-11 DIAGNOSIS — I868 Varicose veins of other specified sites: Secondary | ICD-10-CM | POA: Diagnosis not present

## 2016-10-11 DIAGNOSIS — Z9884 Bariatric surgery status: Secondary | ICD-10-CM | POA: Diagnosis not present

## 2016-10-11 DIAGNOSIS — K766 Portal hypertension: Secondary | ICD-10-CM | POA: Diagnosis not present

## 2016-10-11 DIAGNOSIS — Z9889 Other specified postprocedural states: Secondary | ICD-10-CM | POA: Diagnosis not present

## 2016-10-11 DIAGNOSIS — K7581 Nonalcoholic steatohepatitis (NASH): Secondary | ICD-10-CM | POA: Diagnosis not present

## 2016-10-11 DIAGNOSIS — K7460 Unspecified cirrhosis of liver: Secondary | ICD-10-CM | POA: Diagnosis not present

## 2016-10-11 DIAGNOSIS — C220 Liver cell carcinoma: Secondary | ICD-10-CM | POA: Diagnosis not present

## 2016-10-12 ENCOUNTER — Ambulatory Visit
Admission: RE | Admit: 2016-10-12 | Discharge: 2016-10-12 | Disposition: A | Discharge status: Home / Self Care | Payer: Medicare HMO | Source: Ambulatory Visit | Attending: Nephrology | Admitting: Nephrology

## 2016-10-12 DIAGNOSIS — M7989 Other specified soft tissue disorders: Secondary | ICD-10-CM | POA: Diagnosis not present

## 2016-10-30 DIAGNOSIS — K7460 Unspecified cirrhosis of liver: Secondary | ICD-10-CM | POA: Diagnosis not present

## 2016-10-30 DIAGNOSIS — K7581 Nonalcoholic steatohepatitis (NASH): Secondary | ICD-10-CM | POA: Diagnosis not present

## 2016-11-04 DIAGNOSIS — R410 Disorientation, unspecified: Secondary | ICD-10-CM | POA: Diagnosis not present

## 2016-11-04 DIAGNOSIS — R1110 Vomiting, unspecified: Secondary | ICD-10-CM | POA: Diagnosis not present

## 2016-11-04 DIAGNOSIS — N183 Chronic kidney disease, stage 3 (moderate): Secondary | ICD-10-CM | POA: Diagnosis not present

## 2016-11-04 DIAGNOSIS — Z79899 Other long term (current) drug therapy: Secondary | ICD-10-CM | POA: Diagnosis not present

## 2016-11-04 DIAGNOSIS — K7581 Nonalcoholic steatohepatitis (NASH): Secondary | ICD-10-CM | POA: Diagnosis not present

## 2016-11-04 DIAGNOSIS — A419 Sepsis, unspecified organism: Secondary | ICD-10-CM | POA: Diagnosis not present

## 2016-11-04 DIAGNOSIS — R112 Nausea with vomiting, unspecified: Secondary | ICD-10-CM | POA: Diagnosis not present

## 2016-11-04 DIAGNOSIS — I10 Essential (primary) hypertension: Secondary | ICD-10-CM | POA: Diagnosis not present

## 2016-11-04 DIAGNOSIS — Z87891 Personal history of nicotine dependence: Secondary | ICD-10-CM | POA: Diagnosis not present

## 2016-11-04 DIAGNOSIS — K7290 Hepatic failure, unspecified without coma: Secondary | ICD-10-CM | POA: Diagnosis not present

## 2016-11-04 DIAGNOSIS — Z9119 Patient's noncompliance with other medical treatment and regimen: Secondary | ICD-10-CM | POA: Diagnosis not present

## 2016-11-04 DIAGNOSIS — J449 Chronic obstructive pulmonary disease, unspecified: Secondary | ICD-10-CM | POA: Diagnosis not present

## 2016-11-04 DIAGNOSIS — I129 Hypertensive chronic kidney disease with stage 1 through stage 4 chronic kidney disease, or unspecified chronic kidney disease: Secondary | ICD-10-CM | POA: Diagnosis not present

## 2016-11-04 DIAGNOSIS — R4182 Altered mental status, unspecified: Secondary | ICD-10-CM | POA: Diagnosis not present

## 2016-11-07 DIAGNOSIS — R531 Weakness: Secondary | ICD-10-CM | POA: Diagnosis not present

## 2016-11-07 DIAGNOSIS — I129 Hypertensive chronic kidney disease with stage 1 through stage 4 chronic kidney disease, or unspecified chronic kidney disease: Secondary | ICD-10-CM | POA: Diagnosis not present

## 2016-11-07 DIAGNOSIS — R197 Diarrhea, unspecified: Secondary | ICD-10-CM | POA: Diagnosis not present

## 2016-11-07 DIAGNOSIS — K7581 Nonalcoholic steatohepatitis (NASH): Secondary | ICD-10-CM | POA: Diagnosis not present

## 2016-11-07 DIAGNOSIS — R42 Dizziness and giddiness: Secondary | ICD-10-CM | POA: Diagnosis not present

## 2016-11-07 DIAGNOSIS — R0602 Shortness of breath: Secondary | ICD-10-CM | POA: Diagnosis not present

## 2016-11-07 DIAGNOSIS — N183 Chronic kidney disease, stage 3 (moderate): Secondary | ICD-10-CM | POA: Diagnosis not present

## 2016-11-07 DIAGNOSIS — R51 Headache: Secondary | ICD-10-CM | POA: Diagnosis not present

## 2016-11-08 DIAGNOSIS — R42 Dizziness and giddiness: Secondary | ICD-10-CM | POA: Diagnosis not present

## 2016-11-08 DIAGNOSIS — I129 Hypertensive chronic kidney disease with stage 1 through stage 4 chronic kidney disease, or unspecified chronic kidney disease: Secondary | ICD-10-CM | POA: Diagnosis not present

## 2016-11-08 DIAGNOSIS — N183 Chronic kidney disease, stage 3 (moderate): Secondary | ICD-10-CM | POA: Diagnosis not present

## 2016-11-08 DIAGNOSIS — R197 Diarrhea, unspecified: Secondary | ICD-10-CM | POA: Diagnosis not present

## 2016-11-08 DIAGNOSIS — R531 Weakness: Secondary | ICD-10-CM | POA: Diagnosis not present

## 2016-11-08 DIAGNOSIS — R0602 Shortness of breath: Secondary | ICD-10-CM | POA: Diagnosis not present

## 2016-11-08 DIAGNOSIS — R51 Headache: Secondary | ICD-10-CM | POA: Diagnosis not present

## 2016-11-08 DIAGNOSIS — K7581 Nonalcoholic steatohepatitis (NASH): Secondary | ICD-10-CM | POA: Diagnosis not present

## 2016-11-29 DIAGNOSIS — R69 Illness, unspecified: Secondary | ICD-10-CM | POA: Diagnosis not present

## 2016-11-29 DIAGNOSIS — Z6841 Body Mass Index (BMI) 40.0 and over, adult: Secondary | ICD-10-CM | POA: Diagnosis not present

## 2016-11-29 DIAGNOSIS — K766 Portal hypertension: Secondary | ICD-10-CM | POA: Diagnosis not present

## 2016-11-29 DIAGNOSIS — Z1389 Encounter for screening for other disorder: Secondary | ICD-10-CM | POA: Diagnosis not present

## 2016-11-29 DIAGNOSIS — Z1159 Encounter for screening for other viral diseases: Secondary | ICD-10-CM | POA: Diagnosis not present

## 2016-11-29 DIAGNOSIS — I119 Hypertensive heart disease without heart failure: Secondary | ICD-10-CM | POA: Diagnosis not present

## 2016-11-29 DIAGNOSIS — K7460 Unspecified cirrhosis of liver: Secondary | ICD-10-CM | POA: Diagnosis not present

## 2016-11-29 DIAGNOSIS — I8500 Esophageal varices without bleeding: Secondary | ICD-10-CM | POA: Diagnosis not present

## 2016-11-29 DIAGNOSIS — N184 Chronic kidney disease, stage 4 (severe): Secondary | ICD-10-CM | POA: Diagnosis not present

## 2016-12-04 DIAGNOSIS — K7581 Nonalcoholic steatohepatitis (NASH): Secondary | ICD-10-CM | POA: Diagnosis not present

## 2016-12-04 DIAGNOSIS — K7460 Unspecified cirrhosis of liver: Secondary | ICD-10-CM | POA: Diagnosis not present

## 2016-12-04 DIAGNOSIS — K7290 Hepatic failure, unspecified without coma: Secondary | ICD-10-CM | POA: Diagnosis not present

## 2016-12-07 DIAGNOSIS — Z1231 Encounter for screening mammogram for malignant neoplasm of breast: Secondary | ICD-10-CM | POA: Diagnosis not present

## 2016-12-31 DIAGNOSIS — Z87442 Personal history of urinary calculi: Secondary | ICD-10-CM | POA: Diagnosis not present

## 2016-12-31 DIAGNOSIS — K7469 Other cirrhosis of liver: Secondary | ICD-10-CM | POA: Diagnosis not present

## 2016-12-31 DIAGNOSIS — I509 Heart failure, unspecified: Secondary | ICD-10-CM | POA: Diagnosis not present

## 2016-12-31 DIAGNOSIS — J449 Chronic obstructive pulmonary disease, unspecified: Secondary | ICD-10-CM | POA: Diagnosis not present

## 2016-12-31 DIAGNOSIS — K7290 Hepatic failure, unspecified without coma: Secondary | ICD-10-CM | POA: Diagnosis not present

## 2016-12-31 DIAGNOSIS — R011 Cardiac murmur, unspecified: Secondary | ICD-10-CM | POA: Diagnosis not present

## 2016-12-31 DIAGNOSIS — Z9884 Bariatric surgery status: Secondary | ICD-10-CM | POA: Diagnosis not present

## 2016-12-31 DIAGNOSIS — K766 Portal hypertension: Secondary | ICD-10-CM | POA: Diagnosis not present

## 2016-12-31 DIAGNOSIS — I67841 Reversible cerebrovascular vasoconstriction syndrome: Secondary | ICD-10-CM | POA: Diagnosis not present

## 2016-12-31 DIAGNOSIS — K219 Gastro-esophageal reflux disease without esophagitis: Secondary | ICD-10-CM | POA: Diagnosis not present

## 2016-12-31 DIAGNOSIS — G4733 Obstructive sleep apnea (adult) (pediatric): Secondary | ICD-10-CM | POA: Diagnosis not present

## 2016-12-31 DIAGNOSIS — K7460 Unspecified cirrhosis of liver: Secondary | ICD-10-CM | POA: Diagnosis not present

## 2016-12-31 DIAGNOSIS — R600 Localized edema: Secondary | ICD-10-CM | POA: Diagnosis not present

## 2016-12-31 DIAGNOSIS — G9340 Encephalopathy, unspecified: Secondary | ICD-10-CM | POA: Diagnosis not present

## 2016-12-31 DIAGNOSIS — Z9889 Other specified postprocedural states: Secondary | ICD-10-CM | POA: Diagnosis not present

## 2016-12-31 DIAGNOSIS — N179 Acute kidney failure, unspecified: Secondary | ICD-10-CM | POA: Diagnosis not present

## 2016-12-31 DIAGNOSIS — K7581 Nonalcoholic steatohepatitis (NASH): Secondary | ICD-10-CM | POA: Diagnosis not present

## 2016-12-31 DIAGNOSIS — E785 Hyperlipidemia, unspecified: Secondary | ICD-10-CM | POA: Diagnosis not present

## 2016-12-31 DIAGNOSIS — E1122 Type 2 diabetes mellitus with diabetic chronic kidney disease: Secondary | ICD-10-CM | POA: Diagnosis not present

## 2016-12-31 DIAGNOSIS — I10 Essential (primary) hypertension: Secondary | ICD-10-CM | POA: Diagnosis not present

## 2016-12-31 DIAGNOSIS — E669 Obesity, unspecified: Secondary | ICD-10-CM | POA: Diagnosis not present

## 2016-12-31 DIAGNOSIS — Z01818 Encounter for other preprocedural examination: Secondary | ICD-10-CM | POA: Diagnosis not present

## 2016-12-31 DIAGNOSIS — I519 Heart disease, unspecified: Secondary | ICD-10-CM | POA: Diagnosis not present

## 2016-12-31 DIAGNOSIS — R188 Other ascites: Secondary | ICD-10-CM | POA: Diagnosis not present

## 2016-12-31 DIAGNOSIS — I129 Hypertensive chronic kidney disease with stage 1 through stage 4 chronic kidney disease, or unspecified chronic kidney disease: Secondary | ICD-10-CM | POA: Diagnosis not present

## 2016-12-31 DIAGNOSIS — N184 Chronic kidney disease, stage 4 (severe): Secondary | ICD-10-CM | POA: Diagnosis not present

## 2016-12-31 DIAGNOSIS — I8510 Secondary esophageal varices without bleeding: Secondary | ICD-10-CM | POA: Diagnosis not present

## 2017-01-01 DIAGNOSIS — I509 Heart failure, unspecified: Secondary | ICD-10-CM | POA: Diagnosis not present

## 2017-01-01 DIAGNOSIS — Z87442 Personal history of urinary calculi: Secondary | ICD-10-CM | POA: Diagnosis not present

## 2017-01-01 DIAGNOSIS — Z01818 Encounter for other preprocedural examination: Secondary | ICD-10-CM | POA: Diagnosis not present

## 2017-01-01 DIAGNOSIS — J449 Chronic obstructive pulmonary disease, unspecified: Secondary | ICD-10-CM | POA: Diagnosis not present

## 2017-01-01 DIAGNOSIS — R188 Other ascites: Secondary | ICD-10-CM | POA: Diagnosis not present

## 2017-01-01 DIAGNOSIS — R918 Other nonspecific abnormal finding of lung field: Secondary | ICD-10-CM | POA: Diagnosis not present

## 2017-01-01 DIAGNOSIS — N184 Chronic kidney disease, stage 4 (severe): Secondary | ICD-10-CM | POA: Diagnosis not present

## 2017-01-01 DIAGNOSIS — I10 Essential (primary) hypertension: Secondary | ICD-10-CM | POA: Diagnosis not present

## 2017-01-01 DIAGNOSIS — K7460 Unspecified cirrhosis of liver: Secondary | ICD-10-CM | POA: Diagnosis not present

## 2017-01-01 DIAGNOSIS — E1122 Type 2 diabetes mellitus with diabetic chronic kidney disease: Secondary | ICD-10-CM | POA: Diagnosis not present

## 2017-01-01 DIAGNOSIS — E785 Hyperlipidemia, unspecified: Secondary | ICD-10-CM | POA: Diagnosis not present

## 2017-01-01 DIAGNOSIS — N179 Acute kidney failure, unspecified: Secondary | ICD-10-CM | POA: Diagnosis not present

## 2017-01-01 DIAGNOSIS — R011 Cardiac murmur, unspecified: Secondary | ICD-10-CM | POA: Diagnosis not present

## 2017-01-02 DIAGNOSIS — Z008 Encounter for other general examination: Secondary | ICD-10-CM | POA: Diagnosis not present

## 2017-01-02 DIAGNOSIS — Z01818 Encounter for other preprocedural examination: Secondary | ICD-10-CM | POA: Diagnosis not present

## 2017-01-02 DIAGNOSIS — Z6841 Body Mass Index (BMI) 40.0 and over, adult: Secondary | ICD-10-CM | POA: Diagnosis not present

## 2017-01-02 DIAGNOSIS — K7290 Hepatic failure, unspecified without coma: Secondary | ICD-10-CM | POA: Diagnosis not present

## 2017-01-02 DIAGNOSIS — E669 Obesity, unspecified: Secondary | ICD-10-CM | POA: Diagnosis not present

## 2017-01-02 DIAGNOSIS — K766 Portal hypertension: Secondary | ICD-10-CM | POA: Diagnosis not present

## 2017-01-02 DIAGNOSIS — K7581 Nonalcoholic steatohepatitis (NASH): Secondary | ICD-10-CM | POA: Diagnosis not present

## 2017-01-02 DIAGNOSIS — R69 Illness, unspecified: Secondary | ICD-10-CM | POA: Diagnosis not present

## 2017-01-04 DIAGNOSIS — Z01818 Encounter for other preprocedural examination: Secondary | ICD-10-CM | POA: Diagnosis not present

## 2017-01-04 DIAGNOSIS — G2581 Restless legs syndrome: Secondary | ICD-10-CM | POA: Diagnosis not present

## 2017-01-04 DIAGNOSIS — R69 Illness, unspecified: Secondary | ICD-10-CM | POA: Diagnosis not present

## 2017-01-04 DIAGNOSIS — F329 Major depressive disorder, single episode, unspecified: Secondary | ICD-10-CM | POA: Diagnosis not present

## 2017-01-09 DIAGNOSIS — I517 Cardiomegaly: Secondary | ICD-10-CM | POA: Diagnosis not present

## 2017-01-09 DIAGNOSIS — K766 Portal hypertension: Secondary | ICD-10-CM | POA: Diagnosis not present

## 2017-01-09 DIAGNOSIS — K7581 Nonalcoholic steatohepatitis (NASH): Secondary | ICD-10-CM | POA: Diagnosis not present

## 2017-01-09 DIAGNOSIS — L0390 Cellulitis, unspecified: Secondary | ICD-10-CM | POA: Diagnosis not present

## 2017-01-09 DIAGNOSIS — K7469 Other cirrhosis of liver: Secondary | ICD-10-CM | POA: Diagnosis not present

## 2017-01-09 DIAGNOSIS — I8510 Secondary esophageal varices without bleeding: Secondary | ICD-10-CM | POA: Diagnosis not present

## 2017-01-09 DIAGNOSIS — Y832 Surgical operation with anastomosis, bypass or graft as the cause of abnormal reaction of the patient, or of later complication, without mention of misadventure at the time of the procedure: Secondary | ICD-10-CM | POA: Diagnosis not present

## 2017-01-09 DIAGNOSIS — I10 Essential (primary) hypertension: Secondary | ICD-10-CM | POA: Diagnosis not present

## 2017-01-09 DIAGNOSIS — N184 Chronic kidney disease, stage 4 (severe): Secondary | ICD-10-CM | POA: Diagnosis not present

## 2017-01-09 DIAGNOSIS — R791 Abnormal coagulation profile: Secondary | ICD-10-CM | POA: Diagnosis not present

## 2017-01-09 DIAGNOSIS — L03116 Cellulitis of left lower limb: Secondary | ICD-10-CM | POA: Diagnosis not present

## 2017-01-09 DIAGNOSIS — N179 Acute kidney failure, unspecified: Secondary | ICD-10-CM | POA: Diagnosis not present

## 2017-01-09 DIAGNOSIS — R188 Other ascites: Secondary | ICD-10-CM | POA: Diagnosis not present

## 2017-01-09 DIAGNOSIS — I5030 Unspecified diastolic (congestive) heart failure: Secondary | ICD-10-CM | POA: Diagnosis not present

## 2017-01-09 DIAGNOSIS — I130 Hypertensive heart and chronic kidney disease with heart failure and stage 1 through stage 4 chronic kidney disease, or unspecified chronic kidney disease: Secondary | ICD-10-CM | POA: Diagnosis not present

## 2017-01-09 DIAGNOSIS — R601 Generalized edema: Secondary | ICD-10-CM | POA: Diagnosis not present

## 2017-01-09 DIAGNOSIS — Z9884 Bariatric surgery status: Secondary | ICD-10-CM | POA: Diagnosis not present

## 2017-01-09 DIAGNOSIS — I5032 Chronic diastolic (congestive) heart failure: Secondary | ICD-10-CM | POA: Diagnosis not present

## 2017-01-09 DIAGNOSIS — K7460 Unspecified cirrhosis of liver: Secondary | ICD-10-CM | POA: Diagnosis not present

## 2017-01-09 DIAGNOSIS — K7290 Hepatic failure, unspecified without coma: Secondary | ICD-10-CM | POA: Diagnosis not present

## 2017-01-22 DIAGNOSIS — N183 Chronic kidney disease, stage 3 (moderate): Secondary | ICD-10-CM | POA: Diagnosis not present

## 2017-01-22 DIAGNOSIS — D638 Anemia in other chronic diseases classified elsewhere: Secondary | ICD-10-CM | POA: Diagnosis not present

## 2017-01-22 DIAGNOSIS — K7460 Unspecified cirrhosis of liver: Secondary | ICD-10-CM | POA: Diagnosis not present

## 2017-01-22 DIAGNOSIS — K7581 Nonalcoholic steatohepatitis (NASH): Secondary | ICD-10-CM | POA: Diagnosis not present

## 2017-01-22 DIAGNOSIS — J90 Pleural effusion, not elsewhere classified: Secondary | ICD-10-CM | POA: Diagnosis not present

## 2017-01-24 DIAGNOSIS — I129 Hypertensive chronic kidney disease with stage 1 through stage 4 chronic kidney disease, or unspecified chronic kidney disease: Secondary | ICD-10-CM | POA: Diagnosis not present

## 2017-01-24 DIAGNOSIS — N3000 Acute cystitis without hematuria: Secondary | ICD-10-CM | POA: Diagnosis not present

## 2017-01-24 DIAGNOSIS — D638 Anemia in other chronic diseases classified elsewhere: Secondary | ICD-10-CM | POA: Diagnosis not present

## 2017-01-24 DIAGNOSIS — E8770 Fluid overload, unspecified: Secondary | ICD-10-CM | POA: Diagnosis not present

## 2017-01-24 DIAGNOSIS — N184 Chronic kidney disease, stage 4 (severe): Secondary | ICD-10-CM | POA: Diagnosis not present

## 2017-01-24 DIAGNOSIS — K7291 Hepatic failure, unspecified with coma: Secondary | ICD-10-CM | POA: Diagnosis not present

## 2017-01-24 DIAGNOSIS — Z9114 Patient's other noncompliance with medication regimen: Secondary | ICD-10-CM | POA: Diagnosis not present

## 2017-01-24 DIAGNOSIS — K7290 Hepatic failure, unspecified without coma: Secondary | ICD-10-CM | POA: Diagnosis not present

## 2017-01-24 DIAGNOSIS — K7460 Unspecified cirrhosis of liver: Secondary | ICD-10-CM | POA: Diagnosis not present

## 2017-01-24 DIAGNOSIS — K7581 Nonalcoholic steatohepatitis (NASH): Secondary | ICD-10-CM | POA: Diagnosis not present

## 2017-01-25 DIAGNOSIS — K7290 Hepatic failure, unspecified without coma: Secondary | ICD-10-CM | POA: Diagnosis not present

## 2017-01-25 DIAGNOSIS — N3000 Acute cystitis without hematuria: Secondary | ICD-10-CM | POA: Diagnosis not present

## 2017-02-04 DIAGNOSIS — K7460 Unspecified cirrhosis of liver: Secondary | ICD-10-CM | POA: Diagnosis not present

## 2017-02-04 DIAGNOSIS — R188 Other ascites: Secondary | ICD-10-CM | POA: Diagnosis not present

## 2017-02-06 DIAGNOSIS — Z23 Encounter for immunization: Secondary | ICD-10-CM | POA: Diagnosis not present

## 2017-02-06 DIAGNOSIS — Z6838 Body mass index (BMI) 38.0-38.9, adult: Secondary | ICD-10-CM | POA: Diagnosis not present

## 2017-02-06 DIAGNOSIS — N184 Chronic kidney disease, stage 4 (severe): Secondary | ICD-10-CM | POA: Diagnosis not present

## 2017-02-06 DIAGNOSIS — M159 Polyosteoarthritis, unspecified: Secondary | ICD-10-CM | POA: Diagnosis not present

## 2017-02-06 DIAGNOSIS — K766 Portal hypertension: Secondary | ICD-10-CM | POA: Diagnosis not present

## 2017-02-06 DIAGNOSIS — I8500 Esophageal varices without bleeding: Secondary | ICD-10-CM | POA: Diagnosis not present

## 2017-02-06 DIAGNOSIS — I119 Hypertensive heart disease without heart failure: Secondary | ICD-10-CM | POA: Diagnosis not present

## 2017-02-06 DIAGNOSIS — D500 Iron deficiency anemia secondary to blood loss (chronic): Secondary | ICD-10-CM | POA: Diagnosis not present

## 2017-02-06 DIAGNOSIS — K7460 Unspecified cirrhosis of liver: Secondary | ICD-10-CM | POA: Diagnosis not present

## 2017-02-08 DIAGNOSIS — R188 Other ascites: Secondary | ICD-10-CM | POA: Diagnosis not present

## 2017-02-08 DIAGNOSIS — K7460 Unspecified cirrhosis of liver: Secondary | ICD-10-CM | POA: Diagnosis not present

## 2017-02-12 DIAGNOSIS — N289 Disorder of kidney and ureter, unspecified: Secondary | ICD-10-CM | POA: Diagnosis not present

## 2017-02-19 DIAGNOSIS — K7581 Nonalcoholic steatohepatitis (NASH): Secondary | ICD-10-CM | POA: Diagnosis not present

## 2017-02-19 DIAGNOSIS — Z01818 Encounter for other preprocedural examination: Secondary | ICD-10-CM | POA: Diagnosis not present

## 2017-02-19 DIAGNOSIS — Z008 Encounter for other general examination: Secondary | ICD-10-CM | POA: Diagnosis not present

## 2017-02-19 DIAGNOSIS — N183 Chronic kidney disease, stage 3 (moderate): Secondary | ICD-10-CM | POA: Diagnosis not present

## 2017-02-19 DIAGNOSIS — Z7682 Awaiting organ transplant status: Secondary | ICD-10-CM | POA: Diagnosis not present

## 2017-02-19 DIAGNOSIS — R188 Other ascites: Secondary | ICD-10-CM | POA: Diagnosis not present

## 2017-02-19 DIAGNOSIS — K7460 Unspecified cirrhosis of liver: Secondary | ICD-10-CM | POA: Diagnosis not present

## 2017-02-19 DIAGNOSIS — F54 Psychological and behavioral factors associated with disorders or diseases classified elsewhere: Secondary | ICD-10-CM | POA: Diagnosis not present

## 2017-02-24 DIAGNOSIS — R680 Hypothermia, not associated with low environmental temperature: Secondary | ICD-10-CM | POA: Diagnosis not present

## 2017-02-24 DIAGNOSIS — D649 Anemia, unspecified: Secondary | ICD-10-CM | POA: Diagnosis not present

## 2017-02-24 DIAGNOSIS — I10 Essential (primary) hypertension: Secondary | ICD-10-CM | POA: Diagnosis not present

## 2017-02-24 DIAGNOSIS — R402441 Other coma, without documented Glasgow coma scale score, or with partial score reported, in the field [EMT or ambulance]: Secondary | ICD-10-CM | POA: Diagnosis not present

## 2017-02-24 DIAGNOSIS — I129 Hypertensive chronic kidney disease with stage 1 through stage 4 chronic kidney disease, or unspecified chronic kidney disease: Secondary | ICD-10-CM | POA: Diagnosis not present

## 2017-02-24 DIAGNOSIS — Z4682 Encounter for fitting and adjustment of non-vascular catheter: Secondary | ICD-10-CM | POA: Diagnosis not present

## 2017-02-24 DIAGNOSIS — R69 Illness, unspecified: Secondary | ICD-10-CM | POA: Diagnosis not present

## 2017-02-24 DIAGNOSIS — E7220 Disorder of urea cycle metabolism, unspecified: Secondary | ICD-10-CM | POA: Diagnosis not present

## 2017-02-24 DIAGNOSIS — K7290 Hepatic failure, unspecified without coma: Secondary | ICD-10-CM | POA: Diagnosis not present

## 2017-02-24 DIAGNOSIS — K7581 Nonalcoholic steatohepatitis (NASH): Secondary | ICD-10-CM | POA: Diagnosis not present

## 2017-02-24 DIAGNOSIS — Z6841 Body Mass Index (BMI) 40.0 and over, adult: Secondary | ICD-10-CM | POA: Diagnosis not present

## 2017-02-24 DIAGNOSIS — N179 Acute kidney failure, unspecified: Secondary | ICD-10-CM | POA: Diagnosis not present

## 2017-02-24 DIAGNOSIS — N189 Chronic kidney disease, unspecified: Secondary | ICD-10-CM | POA: Diagnosis not present

## 2017-02-24 DIAGNOSIS — D696 Thrombocytopenia, unspecified: Secondary | ICD-10-CM | POA: Diagnosis not present

## 2017-02-24 DIAGNOSIS — R4182 Altered mental status, unspecified: Secondary | ICD-10-CM | POA: Diagnosis not present

## 2017-02-24 DIAGNOSIS — K7460 Unspecified cirrhosis of liver: Secondary | ICD-10-CM | POA: Diagnosis not present

## 2017-02-25 DIAGNOSIS — E873 Alkalosis: Secondary | ICD-10-CM | POA: Diagnosis not present

## 2017-02-25 DIAGNOSIS — K766 Portal hypertension: Secondary | ICD-10-CM | POA: Diagnosis not present

## 2017-02-25 DIAGNOSIS — I5030 Unspecified diastolic (congestive) heart failure: Secondary | ICD-10-CM | POA: Diagnosis not present

## 2017-02-25 DIAGNOSIS — E7220 Disorder of urea cycle metabolism, unspecified: Secondary | ICD-10-CM | POA: Diagnosis not present

## 2017-02-25 DIAGNOSIS — I10 Essential (primary) hypertension: Secondary | ICD-10-CM | POA: Diagnosis not present

## 2017-02-25 DIAGNOSIS — R918 Other nonspecific abnormal finding of lung field: Secondary | ICD-10-CM | POA: Diagnosis not present

## 2017-02-25 DIAGNOSIS — I8510 Secondary esophageal varices without bleeding: Secondary | ICD-10-CM | POA: Diagnosis not present

## 2017-02-25 DIAGNOSIS — K7290 Hepatic failure, unspecified without coma: Secondary | ICD-10-CM | POA: Diagnosis not present

## 2017-02-25 DIAGNOSIS — G9340 Encephalopathy, unspecified: Secondary | ICD-10-CM | POA: Diagnosis not present

## 2017-02-25 DIAGNOSIS — N189 Chronic kidney disease, unspecified: Secondary | ICD-10-CM | POA: Diagnosis not present

## 2017-02-25 DIAGNOSIS — I130 Hypertensive heart and chronic kidney disease with heart failure and stage 1 through stage 4 chronic kidney disease, or unspecified chronic kidney disease: Secondary | ICD-10-CM | POA: Diagnosis not present

## 2017-02-25 DIAGNOSIS — K7581 Nonalcoholic steatohepatitis (NASH): Secondary | ICD-10-CM | POA: Diagnosis not present

## 2017-02-25 DIAGNOSIS — Z4659 Encounter for fitting and adjustment of other gastrointestinal appliance and device: Secondary | ICD-10-CM | POA: Diagnosis not present

## 2017-02-25 DIAGNOSIS — I5032 Chronic diastolic (congestive) heart failure: Secondary | ICD-10-CM | POA: Diagnosis not present

## 2017-02-25 DIAGNOSIS — G92 Toxic encephalopathy: Secondary | ICD-10-CM | POA: Diagnosis not present

## 2017-02-25 DIAGNOSIS — K767 Hepatorenal syndrome: Secondary | ICD-10-CM | POA: Diagnosis not present

## 2017-02-25 DIAGNOSIS — D696 Thrombocytopenia, unspecified: Secondary | ICD-10-CM | POA: Diagnosis not present

## 2017-02-25 DIAGNOSIS — R531 Weakness: Secondary | ICD-10-CM | POA: Diagnosis not present

## 2017-02-25 DIAGNOSIS — D649 Anemia, unspecified: Secondary | ICD-10-CM | POA: Diagnosis not present

## 2017-02-25 DIAGNOSIS — R188 Other ascites: Secondary | ICD-10-CM | POA: Diagnosis not present

## 2017-02-25 DIAGNOSIS — R402441 Other coma, without documented Glasgow coma scale score, or with partial score reported, in the field [EMT or ambulance]: Secondary | ICD-10-CM | POA: Diagnosis not present

## 2017-02-25 DIAGNOSIS — K029 Dental caries, unspecified: Secondary | ICD-10-CM | POA: Diagnosis not present

## 2017-02-25 DIAGNOSIS — Z9189 Other specified personal risk factors, not elsewhere classified: Secondary | ICD-10-CM | POA: Diagnosis not present

## 2017-02-25 DIAGNOSIS — N179 Acute kidney failure, unspecified: Secondary | ICD-10-CM | POA: Diagnosis not present

## 2017-02-25 DIAGNOSIS — K7460 Unspecified cirrhosis of liver: Secondary | ICD-10-CM | POA: Diagnosis not present

## 2017-02-25 DIAGNOSIS — K7200 Acute and subacute hepatic failure without coma: Secondary | ICD-10-CM | POA: Diagnosis not present

## 2017-02-25 DIAGNOSIS — R140 Abdominal distension (gaseous): Secondary | ICD-10-CM | POA: Diagnosis not present

## 2017-02-25 DIAGNOSIS — E870 Hyperosmolality and hypernatremia: Secondary | ICD-10-CM | POA: Diagnosis not present

## 2017-02-25 DIAGNOSIS — Z6841 Body Mass Index (BMI) 40.0 and over, adult: Secondary | ICD-10-CM | POA: Diagnosis not present

## 2017-03-05 DIAGNOSIS — E1122 Type 2 diabetes mellitus with diabetic chronic kidney disease: Secondary | ICD-10-CM | POA: Diagnosis not present

## 2017-03-05 DIAGNOSIS — I129 Hypertensive chronic kidney disease with stage 1 through stage 4 chronic kidney disease, or unspecified chronic kidney disease: Secondary | ICD-10-CM | POA: Diagnosis not present

## 2017-03-05 DIAGNOSIS — K766 Portal hypertension: Secondary | ICD-10-CM | POA: Diagnosis not present

## 2017-03-05 DIAGNOSIS — K7290 Hepatic failure, unspecified without coma: Secondary | ICD-10-CM | POA: Diagnosis not present

## 2017-03-05 DIAGNOSIS — N184 Chronic kidney disease, stage 4 (severe): Secondary | ICD-10-CM | POA: Diagnosis not present

## 2017-03-05 DIAGNOSIS — I5030 Unspecified diastolic (congestive) heart failure: Secondary | ICD-10-CM | POA: Diagnosis not present

## 2017-03-05 DIAGNOSIS — I8500 Esophageal varices without bleeding: Secondary | ICD-10-CM | POA: Diagnosis not present

## 2017-03-05 DIAGNOSIS — R69 Illness, unspecified: Secondary | ICD-10-CM | POA: Diagnosis not present

## 2017-03-05 DIAGNOSIS — K7200 Acute and subacute hepatic failure without coma: Secondary | ICD-10-CM | POA: Diagnosis not present

## 2017-03-05 DIAGNOSIS — D696 Thrombocytopenia, unspecified: Secondary | ICD-10-CM | POA: Diagnosis not present

## 2017-03-06 DIAGNOSIS — Z87891 Personal history of nicotine dependence: Secondary | ICD-10-CM | POA: Diagnosis not present

## 2017-03-06 DIAGNOSIS — R601 Generalized edema: Secondary | ICD-10-CM | POA: Diagnosis not present

## 2017-03-06 DIAGNOSIS — K7460 Unspecified cirrhosis of liver: Secondary | ICD-10-CM | POA: Diagnosis not present

## 2017-03-06 DIAGNOSIS — K7581 Nonalcoholic steatohepatitis (NASH): Secondary | ICD-10-CM | POA: Diagnosis not present

## 2017-03-06 DIAGNOSIS — R188 Other ascites: Secondary | ICD-10-CM | POA: Diagnosis not present

## 2017-03-06 DIAGNOSIS — G9340 Encephalopathy, unspecified: Secondary | ICD-10-CM | POA: Diagnosis not present

## 2017-03-08 DIAGNOSIS — K7460 Unspecified cirrhosis of liver: Secondary | ICD-10-CM | POA: Diagnosis not present

## 2017-03-08 DIAGNOSIS — I130 Hypertensive heart and chronic kidney disease with heart failure and stage 1 through stage 4 chronic kidney disease, or unspecified chronic kidney disease: Secondary | ICD-10-CM | POA: Diagnosis not present

## 2017-03-08 DIAGNOSIS — R69 Illness, unspecified: Secondary | ICD-10-CM | POA: Diagnosis not present

## 2017-03-08 DIAGNOSIS — I5030 Unspecified diastolic (congestive) heart failure: Secondary | ICD-10-CM | POA: Diagnosis not present

## 2017-03-08 DIAGNOSIS — K7581 Nonalcoholic steatohepatitis (NASH): Secondary | ICD-10-CM | POA: Diagnosis not present

## 2017-03-08 DIAGNOSIS — N183 Chronic kidney disease, stage 3 (moderate): Secondary | ICD-10-CM | POA: Diagnosis not present

## 2017-03-08 DIAGNOSIS — D631 Anemia in chronic kidney disease: Secondary | ICD-10-CM | POA: Diagnosis not present

## 2017-03-08 DIAGNOSIS — E1122 Type 2 diabetes mellitus with diabetic chronic kidney disease: Secondary | ICD-10-CM | POA: Diagnosis not present

## 2017-03-08 DIAGNOSIS — K766 Portal hypertension: Secondary | ICD-10-CM | POA: Diagnosis not present

## 2017-03-08 DIAGNOSIS — G4733 Obstructive sleep apnea (adult) (pediatric): Secondary | ICD-10-CM | POA: Diagnosis not present

## 2017-03-20 DIAGNOSIS — Z0000 Encounter for general adult medical examination without abnormal findings: Secondary | ICD-10-CM | POA: Diagnosis not present

## 2017-03-20 DIAGNOSIS — Z1339 Encounter for screening examination for other mental health and behavioral disorders: Secondary | ICD-10-CM | POA: Diagnosis not present

## 2017-03-20 DIAGNOSIS — Z6839 Body mass index (BMI) 39.0-39.9, adult: Secondary | ICD-10-CM | POA: Diagnosis not present

## 2017-03-20 DIAGNOSIS — Z01419 Encounter for gynecological examination (general) (routine) without abnormal findings: Secondary | ICD-10-CM | POA: Diagnosis not present

## 2017-04-01 DIAGNOSIS — N184 Chronic kidney disease, stage 4 (severe): Secondary | ICD-10-CM | POA: Diagnosis not present

## 2017-04-01 DIAGNOSIS — I5030 Unspecified diastolic (congestive) heart failure: Secondary | ICD-10-CM | POA: Diagnosis not present

## 2017-04-01 DIAGNOSIS — D638 Anemia in other chronic diseases classified elsewhere: Secondary | ICD-10-CM | POA: Diagnosis not present

## 2017-04-01 DIAGNOSIS — K7460 Unspecified cirrhosis of liver: Secondary | ICD-10-CM | POA: Diagnosis not present

## 2017-04-01 DIAGNOSIS — E8770 Fluid overload, unspecified: Secondary | ICD-10-CM | POA: Diagnosis not present

## 2017-04-01 DIAGNOSIS — K7581 Nonalcoholic steatohepatitis (NASH): Secondary | ICD-10-CM | POA: Diagnosis not present

## 2017-04-01 DIAGNOSIS — I129 Hypertensive chronic kidney disease with stage 1 through stage 4 chronic kidney disease, or unspecified chronic kidney disease: Secondary | ICD-10-CM | POA: Diagnosis not present

## 2017-04-01 DIAGNOSIS — E1122 Type 2 diabetes mellitus with diabetic chronic kidney disease: Secondary | ICD-10-CM | POA: Diagnosis not present

## 2017-04-09 DIAGNOSIS — H353131 Nonexudative age-related macular degeneration, bilateral, early dry stage: Secondary | ICD-10-CM | POA: Diagnosis not present

## 2017-04-09 DIAGNOSIS — H25043 Posterior subcapsular polar age-related cataract, bilateral: Secondary | ICD-10-CM | POA: Diagnosis not present

## 2017-04-09 DIAGNOSIS — H43823 Vitreomacular adhesion, bilateral: Secondary | ICD-10-CM | POA: Diagnosis not present

## 2017-04-11 DIAGNOSIS — Z6841 Body Mass Index (BMI) 40.0 and over, adult: Secondary | ICD-10-CM | POA: Diagnosis not present

## 2017-04-11 DIAGNOSIS — K219 Gastro-esophageal reflux disease without esophagitis: Secondary | ICD-10-CM | POA: Diagnosis not present

## 2017-04-11 DIAGNOSIS — K7460 Unspecified cirrhosis of liver: Secondary | ICD-10-CM | POA: Diagnosis not present

## 2017-04-11 DIAGNOSIS — I10 Essential (primary) hypertension: Secondary | ICD-10-CM | POA: Diagnosis not present

## 2017-04-11 DIAGNOSIS — I8500 Esophageal varices without bleeding: Secondary | ICD-10-CM | POA: Diagnosis not present

## 2017-04-11 DIAGNOSIS — I5030 Unspecified diastolic (congestive) heart failure: Secondary | ICD-10-CM | POA: Diagnosis not present

## 2017-04-11 DIAGNOSIS — G4733 Obstructive sleep apnea (adult) (pediatric): Secondary | ICD-10-CM | POA: Diagnosis not present

## 2017-04-11 DIAGNOSIS — R188 Other ascites: Secondary | ICD-10-CM | POA: Diagnosis not present

## 2017-04-11 DIAGNOSIS — K029 Dental caries, unspecified: Secondary | ICD-10-CM | POA: Diagnosis not present

## 2017-04-11 DIAGNOSIS — G4761 Periodic limb movement disorder: Secondary | ICD-10-CM | POA: Diagnosis not present

## 2017-04-13 DIAGNOSIS — R441 Visual hallucinations: Secondary | ICD-10-CM | POA: Diagnosis not present

## 2017-04-13 DIAGNOSIS — R509 Fever, unspecified: Secondary | ICD-10-CM | POA: Diagnosis not present

## 2017-04-16 DIAGNOSIS — Z0100 Encounter for examination of eyes and vision without abnormal findings: Secondary | ICD-10-CM | POA: Diagnosis not present

## 2017-04-19 DIAGNOSIS — R188 Other ascites: Secondary | ICD-10-CM | POA: Diagnosis not present

## 2017-04-19 DIAGNOSIS — K7460 Unspecified cirrhosis of liver: Secondary | ICD-10-CM | POA: Diagnosis not present

## 2017-04-22 DIAGNOSIS — E559 Vitamin D deficiency, unspecified: Secondary | ICD-10-CM | POA: Diagnosis not present

## 2017-04-22 DIAGNOSIS — D638 Anemia in other chronic diseases classified elsewhere: Secondary | ICD-10-CM | POA: Diagnosis not present

## 2017-04-22 DIAGNOSIS — K08409 Partial loss of teeth, unspecified cause, unspecified class: Secondary | ICD-10-CM | POA: Diagnosis not present

## 2017-04-22 DIAGNOSIS — E876 Hypokalemia: Secondary | ICD-10-CM | POA: Diagnosis not present

## 2017-04-22 DIAGNOSIS — R69 Illness, unspecified: Secondary | ICD-10-CM | POA: Diagnosis not present

## 2017-04-22 DIAGNOSIS — J45909 Unspecified asthma, uncomplicated: Secondary | ICD-10-CM | POA: Diagnosis not present

## 2017-04-22 DIAGNOSIS — Z0000 Encounter for general adult medical examination without abnormal findings: Secondary | ICD-10-CM | POA: Diagnosis not present

## 2017-04-22 DIAGNOSIS — G629 Polyneuropathy, unspecified: Secondary | ICD-10-CM | POA: Diagnosis not present

## 2017-04-22 DIAGNOSIS — G2581 Restless legs syndrome: Secondary | ICD-10-CM | POA: Diagnosis not present

## 2017-04-23 DIAGNOSIS — R531 Weakness: Secondary | ICD-10-CM | POA: Diagnosis not present

## 2017-05-17 DIAGNOSIS — L299 Pruritus, unspecified: Secondary | ICD-10-CM | POA: Diagnosis not present

## 2017-05-26 DIAGNOSIS — N179 Acute kidney failure, unspecified: Secondary | ICD-10-CM | POA: Diagnosis not present

## 2017-05-26 DIAGNOSIS — K7460 Unspecified cirrhosis of liver: Secondary | ICD-10-CM | POA: Diagnosis not present

## 2017-05-26 DIAGNOSIS — D638 Anemia in other chronic diseases classified elsewhere: Secondary | ICD-10-CM | POA: Diagnosis not present

## 2017-05-26 DIAGNOSIS — K766 Portal hypertension: Secondary | ICD-10-CM | POA: Diagnosis not present

## 2017-05-26 DIAGNOSIS — E7220 Disorder of urea cycle metabolism, unspecified: Secondary | ICD-10-CM | POA: Diagnosis not present

## 2017-05-26 DIAGNOSIS — D696 Thrombocytopenia, unspecified: Secondary | ICD-10-CM | POA: Diagnosis not present

## 2017-05-26 DIAGNOSIS — K7290 Hepatic failure, unspecified without coma: Secondary | ICD-10-CM | POA: Diagnosis not present

## 2017-05-26 DIAGNOSIS — Z6841 Body Mass Index (BMI) 40.0 and over, adult: Secondary | ICD-10-CM | POA: Diagnosis not present

## 2017-05-26 DIAGNOSIS — D649 Anemia, unspecified: Secondary | ICD-10-CM | POA: Diagnosis not present

## 2017-05-26 DIAGNOSIS — K7581 Nonalcoholic steatohepatitis (NASH): Secondary | ICD-10-CM | POA: Diagnosis not present

## 2017-05-26 DIAGNOSIS — E871 Hypo-osmolality and hyponatremia: Secondary | ICD-10-CM | POA: Diagnosis not present

## 2017-05-26 DIAGNOSIS — J811 Chronic pulmonary edema: Secondary | ICD-10-CM | POA: Diagnosis not present

## 2017-05-26 DIAGNOSIS — E860 Dehydration: Secondary | ICD-10-CM | POA: Diagnosis not present

## 2017-05-26 DIAGNOSIS — N183 Chronic kidney disease, stage 3 (moderate): Secondary | ICD-10-CM | POA: Diagnosis not present

## 2017-05-26 DIAGNOSIS — I129 Hypertensive chronic kidney disease with stage 1 through stage 4 chronic kidney disease, or unspecified chronic kidney disease: Secondary | ICD-10-CM | POA: Diagnosis not present

## 2017-06-04 DIAGNOSIS — D638 Anemia in other chronic diseases classified elsewhere: Secondary | ICD-10-CM | POA: Diagnosis not present

## 2017-06-04 DIAGNOSIS — K7460 Unspecified cirrhosis of liver: Secondary | ICD-10-CM | POA: Diagnosis not present

## 2017-06-04 DIAGNOSIS — I129 Hypertensive chronic kidney disease with stage 1 through stage 4 chronic kidney disease, or unspecified chronic kidney disease: Secondary | ICD-10-CM | POA: Diagnosis not present

## 2017-06-04 DIAGNOSIS — N2581 Secondary hyperparathyroidism of renal origin: Secondary | ICD-10-CM | POA: Diagnosis not present

## 2017-06-04 DIAGNOSIS — K7581 Nonalcoholic steatohepatitis (NASH): Secondary | ICD-10-CM | POA: Diagnosis not present

## 2017-06-04 DIAGNOSIS — N184 Chronic kidney disease, stage 4 (severe): Secondary | ICD-10-CM | POA: Diagnosis not present

## 2017-06-05 DIAGNOSIS — Z6841 Body Mass Index (BMI) 40.0 and over, adult: Secondary | ICD-10-CM | POA: Diagnosis not present

## 2017-06-05 DIAGNOSIS — G2581 Restless legs syndrome: Secondary | ICD-10-CM | POA: Diagnosis not present

## 2017-06-05 DIAGNOSIS — M25551 Pain in right hip: Secondary | ICD-10-CM | POA: Diagnosis not present

## 2017-06-05 DIAGNOSIS — K7290 Hepatic failure, unspecified without coma: Secondary | ICD-10-CM | POA: Diagnosis not present

## 2017-06-05 DIAGNOSIS — Z9189 Other specified personal risk factors, not elsewhere classified: Secondary | ICD-10-CM | POA: Diagnosis not present

## 2017-06-05 DIAGNOSIS — M25552 Pain in left hip: Secondary | ICD-10-CM | POA: Diagnosis not present

## 2017-06-21 DIAGNOSIS — K7460 Unspecified cirrhosis of liver: Secondary | ICD-10-CM | POA: Diagnosis not present

## 2017-06-21 DIAGNOSIS — G9340 Encephalopathy, unspecified: Secondary | ICD-10-CM | POA: Diagnosis not present

## 2017-06-21 DIAGNOSIS — R188 Other ascites: Secondary | ICD-10-CM | POA: Diagnosis not present

## 2017-06-21 DIAGNOSIS — K766 Portal hypertension: Secondary | ICD-10-CM | POA: Diagnosis not present

## 2017-06-25 DIAGNOSIS — R7989 Other specified abnormal findings of blood chemistry: Secondary | ICD-10-CM | POA: Diagnosis not present

## 2017-06-25 DIAGNOSIS — D649 Anemia, unspecified: Secondary | ICD-10-CM | POA: Diagnosis not present

## 2017-06-28 DIAGNOSIS — Z9884 Bariatric surgery status: Secondary | ICD-10-CM | POA: Diagnosis not present

## 2017-06-28 DIAGNOSIS — N183 Chronic kidney disease, stage 3 (moderate): Secondary | ICD-10-CM | POA: Diagnosis not present

## 2017-06-28 DIAGNOSIS — Z1211 Encounter for screening for malignant neoplasm of colon: Secondary | ICD-10-CM | POA: Diagnosis not present

## 2017-06-28 DIAGNOSIS — Z86010 Personal history of colonic polyps: Secondary | ICD-10-CM | POA: Diagnosis not present

## 2017-06-28 DIAGNOSIS — Z6841 Body Mass Index (BMI) 40.0 and over, adult: Secondary | ICD-10-CM | POA: Diagnosis not present

## 2017-06-28 DIAGNOSIS — Z01818 Encounter for other preprocedural examination: Secondary | ICD-10-CM | POA: Diagnosis not present

## 2017-06-28 DIAGNOSIS — K6389 Other specified diseases of intestine: Secondary | ICD-10-CM | POA: Diagnosis not present

## 2017-06-28 DIAGNOSIS — I129 Hypertensive chronic kidney disease with stage 1 through stage 4 chronic kidney disease, or unspecified chronic kidney disease: Secondary | ICD-10-CM | POA: Diagnosis not present

## 2017-06-28 DIAGNOSIS — Z87891 Personal history of nicotine dependence: Secondary | ICD-10-CM | POA: Diagnosis not present

## 2017-06-28 DIAGNOSIS — K634 Enteroptosis: Secondary | ICD-10-CM | POA: Diagnosis not present

## 2017-06-28 DIAGNOSIS — N189 Chronic kidney disease, unspecified: Secondary | ICD-10-CM | POA: Diagnosis not present

## 2017-06-28 DIAGNOSIS — K5730 Diverticulosis of large intestine without perforation or abscess without bleeding: Secondary | ICD-10-CM | POA: Diagnosis not present

## 2017-06-28 DIAGNOSIS — K6289 Other specified diseases of anus and rectum: Secondary | ICD-10-CM | POA: Diagnosis not present

## 2017-06-28 DIAGNOSIS — K219 Gastro-esophageal reflux disease without esophagitis: Secondary | ICD-10-CM | POA: Diagnosis not present

## 2017-06-28 DIAGNOSIS — D631 Anemia in chronic kidney disease: Secondary | ICD-10-CM | POA: Diagnosis not present

## 2017-06-28 DIAGNOSIS — K7460 Unspecified cirrhosis of liver: Secondary | ICD-10-CM | POA: Diagnosis not present

## 2017-06-28 DIAGNOSIS — R69 Illness, unspecified: Secondary | ICD-10-CM | POA: Diagnosis not present

## 2017-06-28 DIAGNOSIS — I5030 Unspecified diastolic (congestive) heart failure: Secondary | ICD-10-CM | POA: Diagnosis not present

## 2017-06-28 DIAGNOSIS — I110 Hypertensive heart disease with heart failure: Secondary | ICD-10-CM | POA: Diagnosis not present

## 2017-07-01 DIAGNOSIS — D696 Thrombocytopenia, unspecified: Secondary | ICD-10-CM | POA: Diagnosis not present

## 2017-07-01 DIAGNOSIS — K766 Portal hypertension: Secondary | ICD-10-CM | POA: Diagnosis not present

## 2017-07-01 DIAGNOSIS — E1122 Type 2 diabetes mellitus with diabetic chronic kidney disease: Secondary | ICD-10-CM | POA: Diagnosis not present

## 2017-07-01 DIAGNOSIS — I8500 Esophageal varices without bleeding: Secondary | ICD-10-CM | POA: Diagnosis not present

## 2017-07-01 DIAGNOSIS — E559 Vitamin D deficiency, unspecified: Secondary | ICD-10-CM | POA: Diagnosis not present

## 2017-07-01 DIAGNOSIS — N184 Chronic kidney disease, stage 4 (severe): Secondary | ICD-10-CM | POA: Diagnosis not present

## 2017-07-01 DIAGNOSIS — I129 Hypertensive chronic kidney disease with stage 1 through stage 4 chronic kidney disease, or unspecified chronic kidney disease: Secondary | ICD-10-CM | POA: Diagnosis not present

## 2017-07-01 DIAGNOSIS — M159 Polyosteoarthritis, unspecified: Secondary | ICD-10-CM | POA: Diagnosis not present

## 2017-07-01 DIAGNOSIS — Z0000 Encounter for general adult medical examination without abnormal findings: Secondary | ICD-10-CM | POA: Diagnosis not present

## 2017-07-01 DIAGNOSIS — J301 Allergic rhinitis due to pollen: Secondary | ICD-10-CM | POA: Diagnosis not present

## 2017-07-10 DIAGNOSIS — Z008 Encounter for other general examination: Secondary | ICD-10-CM | POA: Diagnosis not present

## 2017-07-10 DIAGNOSIS — K7469 Other cirrhosis of liver: Secondary | ICD-10-CM | POA: Diagnosis not present

## 2017-07-10 DIAGNOSIS — Z7682 Awaiting organ transplant status: Secondary | ICD-10-CM | POA: Diagnosis not present

## 2017-07-10 DIAGNOSIS — I10 Essential (primary) hypertension: Secondary | ICD-10-CM | POA: Diagnosis not present

## 2017-07-10 DIAGNOSIS — Z6841 Body Mass Index (BMI) 40.0 and over, adult: Secondary | ICD-10-CM | POA: Diagnosis not present

## 2017-07-10 DIAGNOSIS — R69 Illness, unspecified: Secondary | ICD-10-CM | POA: Diagnosis not present

## 2017-07-10 DIAGNOSIS — Z01818 Encounter for other preprocedural examination: Secondary | ICD-10-CM | POA: Diagnosis not present

## 2017-07-10 DIAGNOSIS — N184 Chronic kidney disease, stage 4 (severe): Secondary | ICD-10-CM | POA: Diagnosis not present

## 2017-07-25 DIAGNOSIS — N184 Chronic kidney disease, stage 4 (severe): Secondary | ICD-10-CM | POA: Diagnosis not present

## 2017-07-25 DIAGNOSIS — K7581 Nonalcoholic steatohepatitis (NASH): Secondary | ICD-10-CM | POA: Diagnosis not present

## 2017-07-25 DIAGNOSIS — I129 Hypertensive chronic kidney disease with stage 1 through stage 4 chronic kidney disease, or unspecified chronic kidney disease: Secondary | ICD-10-CM | POA: Diagnosis not present

## 2017-07-25 DIAGNOSIS — K7460 Unspecified cirrhosis of liver: Secondary | ICD-10-CM | POA: Diagnosis not present

## 2017-07-25 DIAGNOSIS — D631 Anemia in chronic kidney disease: Secondary | ICD-10-CM | POA: Diagnosis not present

## 2017-07-26 DIAGNOSIS — Z881 Allergy status to other antibiotic agents status: Secondary | ICD-10-CM | POA: Diagnosis not present

## 2017-07-26 DIAGNOSIS — K634 Enteroptosis: Secondary | ICD-10-CM | POA: Diagnosis not present

## 2017-07-26 DIAGNOSIS — Z87891 Personal history of nicotine dependence: Secondary | ICD-10-CM | POA: Diagnosis not present

## 2017-07-26 DIAGNOSIS — Z885 Allergy status to narcotic agent status: Secondary | ICD-10-CM | POA: Diagnosis not present

## 2017-07-26 DIAGNOSIS — Z9884 Bariatric surgery status: Secondary | ICD-10-CM | POA: Diagnosis not present

## 2017-07-26 DIAGNOSIS — K5730 Diverticulosis of large intestine without perforation or abscess without bleeding: Secondary | ICD-10-CM | POA: Diagnosis not present

## 2017-07-26 DIAGNOSIS — Z86010 Personal history of colonic polyps: Secondary | ICD-10-CM | POA: Diagnosis not present

## 2017-07-26 DIAGNOSIS — I8500 Esophageal varices without bleeding: Secondary | ICD-10-CM | POA: Diagnosis not present

## 2017-07-26 DIAGNOSIS — Z888 Allergy status to other drugs, medicaments and biological substances status: Secondary | ICD-10-CM | POA: Diagnosis not present

## 2017-07-26 DIAGNOSIS — I8510 Secondary esophageal varices without bleeding: Secondary | ICD-10-CM | POA: Diagnosis not present

## 2017-07-26 DIAGNOSIS — R69 Illness, unspecified: Secondary | ICD-10-CM | POA: Diagnosis not present

## 2017-07-26 DIAGNOSIS — K7460 Unspecified cirrhosis of liver: Secondary | ICD-10-CM | POA: Diagnosis not present

## 2017-08-02 DIAGNOSIS — E785 Hyperlipidemia, unspecified: Secondary | ICD-10-CM | POA: Diagnosis not present

## 2017-08-02 DIAGNOSIS — D519 Vitamin B12 deficiency anemia, unspecified: Secondary | ICD-10-CM | POA: Diagnosis not present

## 2017-08-02 DIAGNOSIS — R5381 Other malaise: Secondary | ICD-10-CM | POA: Diagnosis not present

## 2017-08-02 DIAGNOSIS — R5383 Other fatigue: Secondary | ICD-10-CM | POA: Diagnosis not present

## 2017-08-02 DIAGNOSIS — Z79899 Other long term (current) drug therapy: Secondary | ICD-10-CM | POA: Diagnosis not present

## 2017-08-02 DIAGNOSIS — E162 Hypoglycemia, unspecified: Secondary | ICD-10-CM | POA: Diagnosis not present

## 2017-08-02 DIAGNOSIS — Z6839 Body mass index (BMI) 39.0-39.9, adult: Secondary | ICD-10-CM | POA: Diagnosis not present

## 2017-08-17 DIAGNOSIS — J209 Acute bronchitis, unspecified: Secondary | ICD-10-CM | POA: Diagnosis not present

## 2017-08-21 DIAGNOSIS — M545 Low back pain: Secondary | ICD-10-CM | POA: Diagnosis not present

## 2017-08-26 DIAGNOSIS — R69 Illness, unspecified: Secondary | ICD-10-CM | POA: Diagnosis not present

## 2017-09-05 DIAGNOSIS — N189 Chronic kidney disease, unspecified: Secondary | ICD-10-CM | POA: Diagnosis not present

## 2017-09-05 DIAGNOSIS — M5136 Other intervertebral disc degeneration, lumbar region: Secondary | ICD-10-CM | POA: Diagnosis not present

## 2017-09-05 DIAGNOSIS — M461 Sacroiliitis, not elsewhere classified: Secondary | ICD-10-CM | POA: Diagnosis not present

## 2017-09-05 DIAGNOSIS — E663 Overweight: Secondary | ICD-10-CM | POA: Diagnosis not present

## 2017-09-05 DIAGNOSIS — G894 Chronic pain syndrome: Secondary | ICD-10-CM | POA: Diagnosis not present

## 2017-09-05 DIAGNOSIS — J449 Chronic obstructive pulmonary disease, unspecified: Secondary | ICD-10-CM | POA: Diagnosis not present

## 2017-09-06 DIAGNOSIS — N179 Acute kidney failure, unspecified: Secondary | ICD-10-CM | POA: Diagnosis not present

## 2017-09-06 DIAGNOSIS — D649 Anemia, unspecified: Secondary | ICD-10-CM | POA: Diagnosis not present

## 2017-09-06 DIAGNOSIS — I129 Hypertensive chronic kidney disease with stage 1 through stage 4 chronic kidney disease, or unspecified chronic kidney disease: Secondary | ICD-10-CM | POA: Diagnosis not present

## 2017-09-06 DIAGNOSIS — K7290 Hepatic failure, unspecified without coma: Secondary | ICD-10-CM | POA: Diagnosis not present

## 2017-09-06 DIAGNOSIS — R531 Weakness: Secondary | ICD-10-CM | POA: Diagnosis not present

## 2017-09-06 DIAGNOSIS — I8510 Secondary esophageal varices without bleeding: Secondary | ICD-10-CM | POA: Diagnosis not present

## 2017-09-06 DIAGNOSIS — N184 Chronic kidney disease, stage 4 (severe): Secondary | ICD-10-CM | POA: Diagnosis not present

## 2017-09-06 DIAGNOSIS — D631 Anemia in chronic kidney disease: Secondary | ICD-10-CM | POA: Diagnosis not present

## 2017-09-06 DIAGNOSIS — K7581 Nonalcoholic steatohepatitis (NASH): Secondary | ICD-10-CM | POA: Diagnosis not present

## 2017-09-06 DIAGNOSIS — D696 Thrombocytopenia, unspecified: Secondary | ICD-10-CM | POA: Diagnosis not present

## 2017-09-06 DIAGNOSIS — Z6841 Body Mass Index (BMI) 40.0 and over, adult: Secondary | ICD-10-CM | POA: Diagnosis not present

## 2017-09-06 DIAGNOSIS — E7220 Disorder of urea cycle metabolism, unspecified: Secondary | ICD-10-CM | POA: Diagnosis not present

## 2017-09-06 DIAGNOSIS — K7460 Unspecified cirrhosis of liver: Secondary | ICD-10-CM | POA: Diagnosis not present

## 2017-09-07 DIAGNOSIS — I8510 Secondary esophageal varices without bleeding: Secondary | ICD-10-CM | POA: Diagnosis not present

## 2017-09-07 DIAGNOSIS — E7220 Disorder of urea cycle metabolism, unspecified: Secondary | ICD-10-CM | POA: Diagnosis not present

## 2017-09-07 DIAGNOSIS — R531 Weakness: Secondary | ICD-10-CM | POA: Diagnosis not present

## 2017-09-07 DIAGNOSIS — N184 Chronic kidney disease, stage 4 (severe): Secondary | ICD-10-CM | POA: Diagnosis not present

## 2017-09-07 DIAGNOSIS — K7290 Hepatic failure, unspecified without coma: Secondary | ICD-10-CM | POA: Diagnosis not present

## 2017-09-07 DIAGNOSIS — N179 Acute kidney failure, unspecified: Secondary | ICD-10-CM | POA: Diagnosis not present

## 2017-09-12 DIAGNOSIS — N184 Chronic kidney disease, stage 4 (severe): Secondary | ICD-10-CM | POA: Diagnosis not present

## 2017-09-12 DIAGNOSIS — D631 Anemia in chronic kidney disease: Secondary | ICD-10-CM | POA: Diagnosis not present

## 2017-09-13 DIAGNOSIS — D631 Anemia in chronic kidney disease: Secondary | ICD-10-CM | POA: Diagnosis not present

## 2017-09-13 DIAGNOSIS — M159 Polyosteoarthritis, unspecified: Secondary | ICD-10-CM | POA: Diagnosis not present

## 2017-09-13 DIAGNOSIS — N184 Chronic kidney disease, stage 4 (severe): Secondary | ICD-10-CM | POA: Diagnosis not present

## 2017-09-13 DIAGNOSIS — Z6839 Body mass index (BMI) 39.0-39.9, adult: Secondary | ICD-10-CM | POA: Diagnosis not present

## 2017-09-13 DIAGNOSIS — K7460 Unspecified cirrhosis of liver: Secondary | ICD-10-CM | POA: Diagnosis not present

## 2017-09-22 DIAGNOSIS — R109 Unspecified abdominal pain: Secondary | ICD-10-CM | POA: Diagnosis not present

## 2017-09-22 DIAGNOSIS — N184 Chronic kidney disease, stage 4 (severe): Secondary | ICD-10-CM | POA: Diagnosis not present

## 2017-09-22 DIAGNOSIS — I10 Essential (primary) hypertension: Secondary | ICD-10-CM | POA: Diagnosis not present

## 2017-09-22 DIAGNOSIS — E440 Moderate protein-calorie malnutrition: Secondary | ICD-10-CM | POA: Diagnosis not present

## 2017-09-22 DIAGNOSIS — D649 Anemia, unspecified: Secondary | ICD-10-CM | POA: Diagnosis not present

## 2017-09-22 DIAGNOSIS — E860 Dehydration: Secondary | ICD-10-CM | POA: Diagnosis not present

## 2017-09-22 DIAGNOSIS — R42 Dizziness and giddiness: Secondary | ICD-10-CM | POA: Diagnosis not present

## 2017-09-22 DIAGNOSIS — R0602 Shortness of breath: Secondary | ICD-10-CM | POA: Diagnosis not present

## 2017-09-22 DIAGNOSIS — I8500 Esophageal varices without bleeding: Secondary | ICD-10-CM | POA: Diagnosis not present

## 2017-09-22 DIAGNOSIS — D6959 Other secondary thrombocytopenia: Secondary | ICD-10-CM | POA: Diagnosis not present

## 2017-09-22 DIAGNOSIS — I129 Hypertensive chronic kidney disease with stage 1 through stage 4 chronic kidney disease, or unspecified chronic kidney disease: Secondary | ICD-10-CM | POA: Diagnosis not present

## 2017-09-22 DIAGNOSIS — Z6841 Body Mass Index (BMI) 40.0 and over, adult: Secondary | ICD-10-CM | POA: Diagnosis not present

## 2017-09-22 DIAGNOSIS — I8510 Secondary esophageal varices without bleeding: Secondary | ICD-10-CM | POA: Diagnosis not present

## 2017-09-22 DIAGNOSIS — K7581 Nonalcoholic steatohepatitis (NASH): Secondary | ICD-10-CM | POA: Diagnosis not present

## 2017-09-22 DIAGNOSIS — K7290 Hepatic failure, unspecified without coma: Secondary | ICD-10-CM | POA: Diagnosis not present

## 2017-09-22 DIAGNOSIS — N179 Acute kidney failure, unspecified: Secondary | ICD-10-CM | POA: Diagnosis not present

## 2017-09-22 DIAGNOSIS — D696 Thrombocytopenia, unspecified: Secondary | ICD-10-CM | POA: Diagnosis not present

## 2017-10-03 DIAGNOSIS — K766 Portal hypertension: Secondary | ICD-10-CM | POA: Diagnosis not present

## 2017-10-03 DIAGNOSIS — K7290 Hepatic failure, unspecified without coma: Secondary | ICD-10-CM | POA: Diagnosis not present

## 2017-10-03 DIAGNOSIS — K7469 Other cirrhosis of liver: Secondary | ICD-10-CM | POA: Diagnosis not present

## 2017-10-03 DIAGNOSIS — N184 Chronic kidney disease, stage 4 (severe): Secondary | ICD-10-CM | POA: Diagnosis not present

## 2017-10-08 DIAGNOSIS — H25043 Posterior subcapsular polar age-related cataract, bilateral: Secondary | ICD-10-CM | POA: Diagnosis not present

## 2017-10-08 DIAGNOSIS — H353131 Nonexudative age-related macular degeneration, bilateral, early dry stage: Secondary | ICD-10-CM | POA: Diagnosis not present

## 2017-10-08 DIAGNOSIS — H43823 Vitreomacular adhesion, bilateral: Secondary | ICD-10-CM | POA: Diagnosis not present

## 2017-10-09 DIAGNOSIS — J449 Chronic obstructive pulmonary disease, unspecified: Secondary | ICD-10-CM | POA: Diagnosis not present

## 2017-10-09 DIAGNOSIS — E663 Overweight: Secondary | ICD-10-CM | POA: Diagnosis not present

## 2017-10-09 DIAGNOSIS — M461 Sacroiliitis, not elsewhere classified: Secondary | ICD-10-CM | POA: Diagnosis not present

## 2017-10-09 DIAGNOSIS — G894 Chronic pain syndrome: Secondary | ICD-10-CM | POA: Diagnosis not present

## 2017-10-09 DIAGNOSIS — M5136 Other intervertebral disc degeneration, lumbar region: Secondary | ICD-10-CM | POA: Diagnosis not present

## 2017-10-09 DIAGNOSIS — K7290 Hepatic failure, unspecified without coma: Secondary | ICD-10-CM | POA: Diagnosis not present

## 2017-10-09 DIAGNOSIS — N189 Chronic kidney disease, unspecified: Secondary | ICD-10-CM | POA: Diagnosis not present

## 2017-10-10 DIAGNOSIS — K7460 Unspecified cirrhosis of liver: Secondary | ICD-10-CM | POA: Diagnosis not present

## 2017-10-10 DIAGNOSIS — K767 Hepatorenal syndrome: Secondary | ICD-10-CM | POA: Diagnosis not present

## 2017-10-10 DIAGNOSIS — D631 Anemia in chronic kidney disease: Secondary | ICD-10-CM | POA: Diagnosis not present

## 2017-10-10 DIAGNOSIS — I129 Hypertensive chronic kidney disease with stage 1 through stage 4 chronic kidney disease, or unspecified chronic kidney disease: Secondary | ICD-10-CM | POA: Diagnosis not present

## 2017-10-10 DIAGNOSIS — N184 Chronic kidney disease, stage 4 (severe): Secondary | ICD-10-CM | POA: Diagnosis not present

## 2017-10-10 DIAGNOSIS — K7581 Nonalcoholic steatohepatitis (NASH): Secondary | ICD-10-CM | POA: Diagnosis not present

## 2017-10-11 DIAGNOSIS — N184 Chronic kidney disease, stage 4 (severe): Secondary | ICD-10-CM | POA: Diagnosis not present

## 2017-10-11 DIAGNOSIS — D631 Anemia in chronic kidney disease: Secondary | ICD-10-CM | POA: Diagnosis not present

## 2017-10-15 DIAGNOSIS — I8500 Esophageal varices without bleeding: Secondary | ICD-10-CM | POA: Diagnosis not present

## 2017-10-15 DIAGNOSIS — N186 End stage renal disease: Secondary | ICD-10-CM | POA: Diagnosis not present

## 2017-10-15 DIAGNOSIS — G8929 Other chronic pain: Secondary | ICD-10-CM | POA: Diagnosis not present

## 2017-10-15 DIAGNOSIS — G2581 Restless legs syndrome: Secondary | ICD-10-CM | POA: Diagnosis not present

## 2017-10-15 DIAGNOSIS — E7220 Disorder of urea cycle metabolism, unspecified: Secondary | ICD-10-CM | POA: Diagnosis not present

## 2017-10-15 DIAGNOSIS — R69 Illness, unspecified: Secondary | ICD-10-CM | POA: Diagnosis not present

## 2017-10-15 DIAGNOSIS — E669 Obesity, unspecified: Secondary | ICD-10-CM | POA: Diagnosis not present

## 2017-10-15 DIAGNOSIS — G4700 Insomnia, unspecified: Secondary | ICD-10-CM | POA: Diagnosis not present

## 2017-10-15 DIAGNOSIS — E60 Dietary zinc deficiency: Secondary | ICD-10-CM | POA: Diagnosis not present

## 2017-10-22 DIAGNOSIS — M461 Sacroiliitis, not elsewhere classified: Secondary | ICD-10-CM | POA: Diagnosis not present

## 2017-10-22 DIAGNOSIS — J449 Chronic obstructive pulmonary disease, unspecified: Secondary | ICD-10-CM | POA: Diagnosis not present

## 2017-10-22 DIAGNOSIS — G894 Chronic pain syndrome: Secondary | ICD-10-CM | POA: Diagnosis not present

## 2017-10-22 DIAGNOSIS — E663 Overweight: Secondary | ICD-10-CM | POA: Diagnosis not present

## 2017-10-22 DIAGNOSIS — M5136 Other intervertebral disc degeneration, lumbar region: Secondary | ICD-10-CM | POA: Diagnosis not present

## 2017-10-22 DIAGNOSIS — N189 Chronic kidney disease, unspecified: Secondary | ICD-10-CM | POA: Diagnosis not present

## 2017-10-25 DIAGNOSIS — N184 Chronic kidney disease, stage 4 (severe): Secondary | ICD-10-CM | POA: Diagnosis not present

## 2017-10-25 DIAGNOSIS — D631 Anemia in chronic kidney disease: Secondary | ICD-10-CM | POA: Diagnosis not present

## 2017-10-29 DIAGNOSIS — R0602 Shortness of breath: Secondary | ICD-10-CM | POA: Diagnosis not present

## 2017-10-29 DIAGNOSIS — J9690 Respiratory failure, unspecified, unspecified whether with hypoxia or hypercapnia: Secondary | ICD-10-CM | POA: Diagnosis not present

## 2017-10-29 DIAGNOSIS — K7290 Hepatic failure, unspecified without coma: Secondary | ICD-10-CM | POA: Diagnosis not present

## 2017-10-29 DIAGNOSIS — K7581 Nonalcoholic steatohepatitis (NASH): Secondary | ICD-10-CM | POA: Diagnosis not present

## 2017-10-29 DIAGNOSIS — E7220 Disorder of urea cycle metabolism, unspecified: Secondary | ICD-10-CM | POA: Diagnosis not present

## 2017-10-30 DIAGNOSIS — E7220 Disorder of urea cycle metabolism, unspecified: Secondary | ICD-10-CM | POA: Diagnosis not present

## 2017-10-30 DIAGNOSIS — K7290 Hepatic failure, unspecified without coma: Secondary | ICD-10-CM | POA: Diagnosis not present

## 2017-10-30 DIAGNOSIS — I129 Hypertensive chronic kidney disease with stage 1 through stage 4 chronic kidney disease, or unspecified chronic kidney disease: Secondary | ICD-10-CM | POA: Diagnosis not present

## 2017-10-30 DIAGNOSIS — N184 Chronic kidney disease, stage 4 (severe): Secondary | ICD-10-CM | POA: Diagnosis not present

## 2017-10-30 DIAGNOSIS — I8500 Esophageal varices without bleeding: Secondary | ICD-10-CM | POA: Diagnosis not present

## 2017-10-30 DIAGNOSIS — D631 Anemia in chronic kidney disease: Secondary | ICD-10-CM | POA: Diagnosis not present

## 2017-10-30 DIAGNOSIS — E860 Dehydration: Secondary | ICD-10-CM | POA: Diagnosis not present

## 2017-10-30 DIAGNOSIS — N390 Urinary tract infection, site not specified: Secondary | ICD-10-CM | POA: Diagnosis not present

## 2017-10-30 DIAGNOSIS — K7581 Nonalcoholic steatohepatitis (NASH): Secondary | ICD-10-CM | POA: Diagnosis not present

## 2017-10-30 DIAGNOSIS — R0602 Shortness of breath: Secondary | ICD-10-CM | POA: Diagnosis not present

## 2017-10-30 DIAGNOSIS — D696 Thrombocytopenia, unspecified: Secondary | ICD-10-CM | POA: Diagnosis not present

## 2017-10-30 DIAGNOSIS — J9690 Respiratory failure, unspecified, unspecified whether with hypoxia or hypercapnia: Secondary | ICD-10-CM | POA: Diagnosis not present

## 2017-10-30 DIAGNOSIS — N189 Chronic kidney disease, unspecified: Secondary | ICD-10-CM | POA: Diagnosis not present

## 2017-11-01 DIAGNOSIS — K7469 Other cirrhosis of liver: Secondary | ICD-10-CM | POA: Diagnosis not present

## 2017-11-06 DIAGNOSIS — K7581 Nonalcoholic steatohepatitis (NASH): Secondary | ICD-10-CM | POA: Diagnosis not present

## 2017-11-06 DIAGNOSIS — I129 Hypertensive chronic kidney disease with stage 1 through stage 4 chronic kidney disease, or unspecified chronic kidney disease: Secondary | ICD-10-CM | POA: Diagnosis not present

## 2017-11-06 DIAGNOSIS — N184 Chronic kidney disease, stage 4 (severe): Secondary | ICD-10-CM | POA: Diagnosis not present

## 2017-11-06 DIAGNOSIS — K767 Hepatorenal syndrome: Secondary | ICD-10-CM | POA: Diagnosis not present

## 2017-11-06 DIAGNOSIS — N390 Urinary tract infection, site not specified: Secondary | ICD-10-CM | POA: Diagnosis not present

## 2017-11-06 DIAGNOSIS — K7460 Unspecified cirrhosis of liver: Secondary | ICD-10-CM | POA: Diagnosis not present

## 2017-11-06 DIAGNOSIS — D631 Anemia in chronic kidney disease: Secondary | ICD-10-CM | POA: Diagnosis not present

## 2017-11-11 DIAGNOSIS — N184 Chronic kidney disease, stage 4 (severe): Secondary | ICD-10-CM | POA: Diagnosis not present

## 2017-11-11 DIAGNOSIS — D631 Anemia in chronic kidney disease: Secondary | ICD-10-CM | POA: Diagnosis not present

## 2017-11-22 DIAGNOSIS — R531 Weakness: Secondary | ICD-10-CM | POA: Diagnosis not present

## 2017-11-22 DIAGNOSIS — G9341 Metabolic encephalopathy: Secondary | ICD-10-CM | POA: Diagnosis not present

## 2017-11-22 DIAGNOSIS — N19 Unspecified kidney failure: Secondary | ICD-10-CM | POA: Diagnosis not present

## 2017-11-22 DIAGNOSIS — R0600 Dyspnea, unspecified: Secondary | ICD-10-CM | POA: Diagnosis not present

## 2017-11-22 DIAGNOSIS — N289 Disorder of kidney and ureter, unspecified: Secondary | ICD-10-CM | POA: Diagnosis not present

## 2017-11-22 DIAGNOSIS — M6281 Muscle weakness (generalized): Secondary | ICD-10-CM | POA: Diagnosis not present

## 2017-11-22 DIAGNOSIS — D696 Thrombocytopenia, unspecified: Secondary | ICD-10-CM | POA: Diagnosis not present

## 2017-11-22 DIAGNOSIS — K7460 Unspecified cirrhosis of liver: Secondary | ICD-10-CM | POA: Diagnosis not present

## 2017-11-25 DIAGNOSIS — N184 Chronic kidney disease, stage 4 (severe): Secondary | ICD-10-CM | POA: Diagnosis not present

## 2017-11-25 DIAGNOSIS — D631 Anemia in chronic kidney disease: Secondary | ICD-10-CM | POA: Diagnosis not present

## 2017-11-26 DIAGNOSIS — K7460 Unspecified cirrhosis of liver: Secondary | ICD-10-CM | POA: Diagnosis not present

## 2017-11-26 DIAGNOSIS — K7469 Other cirrhosis of liver: Secondary | ICD-10-CM | POA: Diagnosis not present

## 2017-11-28 DIAGNOSIS — D696 Thrombocytopenia, unspecified: Secondary | ICD-10-CM | POA: Diagnosis not present

## 2017-11-28 DIAGNOSIS — K7290 Hepatic failure, unspecified without coma: Secondary | ICD-10-CM

## 2017-11-28 DIAGNOSIS — R4182 Altered mental status, unspecified: Secondary | ICD-10-CM | POA: Diagnosis not present

## 2017-11-28 DIAGNOSIS — I129 Hypertensive chronic kidney disease with stage 1 through stage 4 chronic kidney disease, or unspecified chronic kidney disease: Secondary | ICD-10-CM

## 2017-11-28 DIAGNOSIS — N184 Chronic kidney disease, stage 4 (severe): Secondary | ICD-10-CM

## 2017-11-28 DIAGNOSIS — R4781 Slurred speech: Secondary | ICD-10-CM

## 2017-11-28 DIAGNOSIS — G9341 Metabolic encephalopathy: Secondary | ICD-10-CM | POA: Diagnosis not present

## 2017-11-28 DIAGNOSIS — N179 Acute kidney failure, unspecified: Secondary | ICD-10-CM

## 2017-11-28 DIAGNOSIS — K7581 Nonalcoholic steatohepatitis (NASH): Secondary | ICD-10-CM

## 2017-11-28 DIAGNOSIS — D638 Anemia in other chronic diseases classified elsewhere: Secondary | ICD-10-CM | POA: Diagnosis not present

## 2017-11-28 DIAGNOSIS — R531 Weakness: Secondary | ICD-10-CM | POA: Diagnosis not present

## 2017-11-28 DIAGNOSIS — E860 Dehydration: Secondary | ICD-10-CM

## 2017-11-29 DIAGNOSIS — K7581 Nonalcoholic steatohepatitis (NASH): Secondary | ICD-10-CM | POA: Diagnosis not present

## 2017-11-29 DIAGNOSIS — N184 Chronic kidney disease, stage 4 (severe): Secondary | ICD-10-CM | POA: Diagnosis not present

## 2017-11-29 DIAGNOSIS — I129 Hypertensive chronic kidney disease with stage 1 through stage 4 chronic kidney disease, or unspecified chronic kidney disease: Secondary | ICD-10-CM | POA: Diagnosis not present

## 2017-11-29 DIAGNOSIS — R4781 Slurred speech: Secondary | ICD-10-CM | POA: Diagnosis not present

## 2017-11-29 DIAGNOSIS — N179 Acute kidney failure, unspecified: Secondary | ICD-10-CM | POA: Diagnosis not present

## 2017-11-29 DIAGNOSIS — E860 Dehydration: Secondary | ICD-10-CM | POA: Diagnosis not present

## 2017-11-29 DIAGNOSIS — K7290 Hepatic failure, unspecified without coma: Secondary | ICD-10-CM | POA: Diagnosis not present

## 2017-12-01 DIAGNOSIS — S51812D Laceration without foreign body of left forearm, subsequent encounter: Secondary | ICD-10-CM | POA: Diagnosis not present

## 2017-12-01 DIAGNOSIS — I129 Hypertensive chronic kidney disease with stage 1 through stage 4 chronic kidney disease, or unspecified chronic kidney disease: Secondary | ICD-10-CM | POA: Diagnosis not present

## 2017-12-01 DIAGNOSIS — N184 Chronic kidney disease, stage 4 (severe): Secondary | ICD-10-CM | POA: Diagnosis not present

## 2017-12-01 DIAGNOSIS — K7581 Nonalcoholic steatohepatitis (NASH): Secondary | ICD-10-CM | POA: Diagnosis not present

## 2017-12-01 DIAGNOSIS — M150 Primary generalized (osteo)arthritis: Secondary | ICD-10-CM | POA: Diagnosis not present

## 2017-12-01 DIAGNOSIS — S51811D Laceration without foreign body of right forearm, subsequent encounter: Secondary | ICD-10-CM | POA: Diagnosis not present

## 2017-12-01 DIAGNOSIS — J449 Chronic obstructive pulmonary disease, unspecified: Secondary | ICD-10-CM | POA: Diagnosis not present

## 2017-12-01 DIAGNOSIS — K7290 Hepatic failure, unspecified without coma: Secondary | ICD-10-CM | POA: Diagnosis not present

## 2017-12-01 DIAGNOSIS — D631 Anemia in chronic kidney disease: Secondary | ICD-10-CM | POA: Diagnosis not present

## 2017-12-03 DIAGNOSIS — D631 Anemia in chronic kidney disease: Secondary | ICD-10-CM | POA: Diagnosis not present

## 2017-12-03 DIAGNOSIS — K7581 Nonalcoholic steatohepatitis (NASH): Secondary | ICD-10-CM | POA: Diagnosis not present

## 2017-12-03 DIAGNOSIS — S51811D Laceration without foreign body of right forearm, subsequent encounter: Secondary | ICD-10-CM | POA: Diagnosis not present

## 2017-12-03 DIAGNOSIS — M150 Primary generalized (osteo)arthritis: Secondary | ICD-10-CM | POA: Diagnosis not present

## 2017-12-03 DIAGNOSIS — S51812D Laceration without foreign body of left forearm, subsequent encounter: Secondary | ICD-10-CM | POA: Diagnosis not present

## 2017-12-03 DIAGNOSIS — I129 Hypertensive chronic kidney disease with stage 1 through stage 4 chronic kidney disease, or unspecified chronic kidney disease: Secondary | ICD-10-CM | POA: Diagnosis not present

## 2017-12-03 DIAGNOSIS — N184 Chronic kidney disease, stage 4 (severe): Secondary | ICD-10-CM | POA: Diagnosis not present

## 2017-12-03 DIAGNOSIS — K7290 Hepatic failure, unspecified without coma: Secondary | ICD-10-CM | POA: Diagnosis not present

## 2017-12-03 DIAGNOSIS — J449 Chronic obstructive pulmonary disease, unspecified: Secondary | ICD-10-CM | POA: Diagnosis not present

## 2017-12-04 DIAGNOSIS — K7581 Nonalcoholic steatohepatitis (NASH): Secondary | ICD-10-CM | POA: Diagnosis not present

## 2017-12-04 DIAGNOSIS — K7290 Hepatic failure, unspecified without coma: Secondary | ICD-10-CM | POA: Diagnosis not present

## 2017-12-04 DIAGNOSIS — I129 Hypertensive chronic kidney disease with stage 1 through stage 4 chronic kidney disease, or unspecified chronic kidney disease: Secondary | ICD-10-CM | POA: Diagnosis not present

## 2017-12-04 DIAGNOSIS — M150 Primary generalized (osteo)arthritis: Secondary | ICD-10-CM | POA: Diagnosis not present

## 2017-12-04 DIAGNOSIS — N184 Chronic kidney disease, stage 4 (severe): Secondary | ICD-10-CM | POA: Diagnosis not present

## 2017-12-04 DIAGNOSIS — S51812D Laceration without foreign body of left forearm, subsequent encounter: Secondary | ICD-10-CM | POA: Diagnosis not present

## 2017-12-04 DIAGNOSIS — D631 Anemia in chronic kidney disease: Secondary | ICD-10-CM | POA: Diagnosis not present

## 2017-12-04 DIAGNOSIS — J449 Chronic obstructive pulmonary disease, unspecified: Secondary | ICD-10-CM | POA: Diagnosis not present

## 2017-12-04 DIAGNOSIS — S51811D Laceration without foreign body of right forearm, subsequent encounter: Secondary | ICD-10-CM | POA: Diagnosis not present

## 2017-12-05 DIAGNOSIS — D631 Anemia in chronic kidney disease: Secondary | ICD-10-CM | POA: Diagnosis not present

## 2017-12-05 DIAGNOSIS — K7460 Unspecified cirrhosis of liver: Secondary | ICD-10-CM | POA: Diagnosis not present

## 2017-12-05 DIAGNOSIS — N184 Chronic kidney disease, stage 4 (severe): Secondary | ICD-10-CM | POA: Diagnosis not present

## 2017-12-05 DIAGNOSIS — I5030 Unspecified diastolic (congestive) heart failure: Secondary | ICD-10-CM | POA: Diagnosis not present

## 2017-12-05 DIAGNOSIS — E43 Unspecified severe protein-calorie malnutrition: Secondary | ICD-10-CM | POA: Diagnosis not present

## 2017-12-05 DIAGNOSIS — Z6836 Body mass index (BMI) 36.0-36.9, adult: Secondary | ICD-10-CM | POA: Diagnosis not present

## 2017-12-05 DIAGNOSIS — I119 Hypertensive heart disease without heart failure: Secondary | ICD-10-CM | POA: Diagnosis not present

## 2017-12-05 DIAGNOSIS — K760 Fatty (change of) liver, not elsewhere classified: Secondary | ICD-10-CM | POA: Diagnosis not present

## 2017-12-06 DIAGNOSIS — S51811D Laceration without foreign body of right forearm, subsequent encounter: Secondary | ICD-10-CM | POA: Diagnosis not present

## 2017-12-06 DIAGNOSIS — K7581 Nonalcoholic steatohepatitis (NASH): Secondary | ICD-10-CM | POA: Diagnosis not present

## 2017-12-06 DIAGNOSIS — N184 Chronic kidney disease, stage 4 (severe): Secondary | ICD-10-CM | POA: Diagnosis not present

## 2017-12-06 DIAGNOSIS — K7290 Hepatic failure, unspecified without coma: Secondary | ICD-10-CM | POA: Diagnosis not present

## 2017-12-06 DIAGNOSIS — M150 Primary generalized (osteo)arthritis: Secondary | ICD-10-CM | POA: Diagnosis not present

## 2017-12-06 DIAGNOSIS — I129 Hypertensive chronic kidney disease with stage 1 through stage 4 chronic kidney disease, or unspecified chronic kidney disease: Secondary | ICD-10-CM | POA: Diagnosis not present

## 2017-12-06 DIAGNOSIS — D631 Anemia in chronic kidney disease: Secondary | ICD-10-CM | POA: Diagnosis not present

## 2017-12-06 DIAGNOSIS — S51812D Laceration without foreign body of left forearm, subsequent encounter: Secondary | ICD-10-CM | POA: Diagnosis not present

## 2017-12-06 DIAGNOSIS — J449 Chronic obstructive pulmonary disease, unspecified: Secondary | ICD-10-CM | POA: Diagnosis not present

## 2017-12-09 DIAGNOSIS — N184 Chronic kidney disease, stage 4 (severe): Secondary | ICD-10-CM | POA: Diagnosis not present

## 2017-12-09 DIAGNOSIS — D631 Anemia in chronic kidney disease: Secondary | ICD-10-CM | POA: Diagnosis not present

## 2017-12-10 DIAGNOSIS — K7290 Hepatic failure, unspecified without coma: Secondary | ICD-10-CM | POA: Diagnosis not present

## 2017-12-10 DIAGNOSIS — M150 Primary generalized (osteo)arthritis: Secondary | ICD-10-CM | POA: Diagnosis not present

## 2017-12-10 DIAGNOSIS — S51812D Laceration without foreign body of left forearm, subsequent encounter: Secondary | ICD-10-CM | POA: Diagnosis not present

## 2017-12-10 DIAGNOSIS — D631 Anemia in chronic kidney disease: Secondary | ICD-10-CM | POA: Diagnosis not present

## 2017-12-10 DIAGNOSIS — S51811D Laceration without foreign body of right forearm, subsequent encounter: Secondary | ICD-10-CM | POA: Diagnosis not present

## 2017-12-10 DIAGNOSIS — K7581 Nonalcoholic steatohepatitis (NASH): Secondary | ICD-10-CM | POA: Diagnosis not present

## 2017-12-10 DIAGNOSIS — J449 Chronic obstructive pulmonary disease, unspecified: Secondary | ICD-10-CM | POA: Diagnosis not present

## 2017-12-10 DIAGNOSIS — I129 Hypertensive chronic kidney disease with stage 1 through stage 4 chronic kidney disease, or unspecified chronic kidney disease: Secondary | ICD-10-CM | POA: Diagnosis not present

## 2017-12-10 DIAGNOSIS — N184 Chronic kidney disease, stage 4 (severe): Secondary | ICD-10-CM | POA: Diagnosis not present

## 2017-12-13 DIAGNOSIS — N2581 Secondary hyperparathyroidism of renal origin: Secondary | ICD-10-CM | POA: Diagnosis not present

## 2017-12-13 DIAGNOSIS — I129 Hypertensive chronic kidney disease with stage 1 through stage 4 chronic kidney disease, or unspecified chronic kidney disease: Secondary | ICD-10-CM | POA: Diagnosis not present

## 2017-12-13 DIAGNOSIS — N184 Chronic kidney disease, stage 4 (severe): Secondary | ICD-10-CM | POA: Diagnosis not present

## 2017-12-13 DIAGNOSIS — D631 Anemia in chronic kidney disease: Secondary | ICD-10-CM | POA: Diagnosis not present

## 2017-12-13 DIAGNOSIS — K7581 Nonalcoholic steatohepatitis (NASH): Secondary | ICD-10-CM | POA: Diagnosis not present

## 2017-12-13 DIAGNOSIS — K7460 Unspecified cirrhosis of liver: Secondary | ICD-10-CM | POA: Diagnosis not present

## 2017-12-13 DIAGNOSIS — N390 Urinary tract infection, site not specified: Secondary | ICD-10-CM | POA: Diagnosis not present

## 2017-12-13 DIAGNOSIS — K767 Hepatorenal syndrome: Secondary | ICD-10-CM | POA: Diagnosis not present

## 2017-12-17 DIAGNOSIS — J449 Chronic obstructive pulmonary disease, unspecified: Secondary | ICD-10-CM | POA: Diagnosis not present

## 2017-12-17 DIAGNOSIS — D631 Anemia in chronic kidney disease: Secondary | ICD-10-CM | POA: Diagnosis not present

## 2017-12-17 DIAGNOSIS — K7581 Nonalcoholic steatohepatitis (NASH): Secondary | ICD-10-CM | POA: Diagnosis not present

## 2017-12-17 DIAGNOSIS — K7290 Hepatic failure, unspecified without coma: Secondary | ICD-10-CM | POA: Diagnosis not present

## 2017-12-17 DIAGNOSIS — M150 Primary generalized (osteo)arthritis: Secondary | ICD-10-CM | POA: Diagnosis not present

## 2017-12-17 DIAGNOSIS — S51811D Laceration without foreign body of right forearm, subsequent encounter: Secondary | ICD-10-CM | POA: Diagnosis not present

## 2017-12-17 DIAGNOSIS — I129 Hypertensive chronic kidney disease with stage 1 through stage 4 chronic kidney disease, or unspecified chronic kidney disease: Secondary | ICD-10-CM | POA: Diagnosis not present

## 2017-12-17 DIAGNOSIS — N184 Chronic kidney disease, stage 4 (severe): Secondary | ICD-10-CM | POA: Diagnosis not present

## 2017-12-17 DIAGNOSIS — S51812D Laceration without foreign body of left forearm, subsequent encounter: Secondary | ICD-10-CM | POA: Diagnosis not present

## 2017-12-19 DIAGNOSIS — Z9884 Bariatric surgery status: Secondary | ICD-10-CM | POA: Diagnosis not present

## 2017-12-19 DIAGNOSIS — K7469 Other cirrhosis of liver: Secondary | ICD-10-CM | POA: Diagnosis not present

## 2017-12-19 DIAGNOSIS — K7460 Unspecified cirrhosis of liver: Secondary | ICD-10-CM | POA: Diagnosis not present

## 2017-12-19 DIAGNOSIS — Z87891 Personal history of nicotine dependence: Secondary | ICD-10-CM | POA: Diagnosis not present

## 2017-12-19 DIAGNOSIS — N184 Chronic kidney disease, stage 4 (severe): Secondary | ICD-10-CM | POA: Diagnosis not present

## 2017-12-19 DIAGNOSIS — R188 Other ascites: Secondary | ICD-10-CM | POA: Diagnosis not present

## 2017-12-19 DIAGNOSIS — K766 Portal hypertension: Secondary | ICD-10-CM | POA: Diagnosis not present

## 2017-12-19 DIAGNOSIS — Z79899 Other long term (current) drug therapy: Secondary | ICD-10-CM | POA: Diagnosis not present

## 2017-12-20 DIAGNOSIS — J449 Chronic obstructive pulmonary disease, unspecified: Secondary | ICD-10-CM | POA: Diagnosis not present

## 2017-12-20 DIAGNOSIS — K7581 Nonalcoholic steatohepatitis (NASH): Secondary | ICD-10-CM | POA: Diagnosis not present

## 2017-12-20 DIAGNOSIS — D631 Anemia in chronic kidney disease: Secondary | ICD-10-CM | POA: Diagnosis not present

## 2017-12-20 DIAGNOSIS — N184 Chronic kidney disease, stage 4 (severe): Secondary | ICD-10-CM | POA: Diagnosis not present

## 2017-12-20 DIAGNOSIS — M150 Primary generalized (osteo)arthritis: Secondary | ICD-10-CM | POA: Diagnosis not present

## 2017-12-20 DIAGNOSIS — S51811D Laceration without foreign body of right forearm, subsequent encounter: Secondary | ICD-10-CM | POA: Diagnosis not present

## 2017-12-20 DIAGNOSIS — K7290 Hepatic failure, unspecified without coma: Secondary | ICD-10-CM | POA: Diagnosis not present

## 2017-12-20 DIAGNOSIS — I129 Hypertensive chronic kidney disease with stage 1 through stage 4 chronic kidney disease, or unspecified chronic kidney disease: Secondary | ICD-10-CM | POA: Diagnosis not present

## 2017-12-20 DIAGNOSIS — S51812D Laceration without foreign body of left forearm, subsequent encounter: Secondary | ICD-10-CM | POA: Diagnosis not present

## 2017-12-23 DIAGNOSIS — D631 Anemia in chronic kidney disease: Secondary | ICD-10-CM | POA: Diagnosis not present

## 2017-12-23 DIAGNOSIS — N184 Chronic kidney disease, stage 4 (severe): Secondary | ICD-10-CM | POA: Diagnosis not present

## 2017-12-26 DIAGNOSIS — K7290 Hepatic failure, unspecified without coma: Secondary | ICD-10-CM | POA: Diagnosis not present

## 2017-12-26 DIAGNOSIS — K7581 Nonalcoholic steatohepatitis (NASH): Secondary | ICD-10-CM | POA: Diagnosis not present

## 2017-12-26 DIAGNOSIS — N184 Chronic kidney disease, stage 4 (severe): Secondary | ICD-10-CM | POA: Diagnosis not present

## 2017-12-26 DIAGNOSIS — S51812D Laceration without foreign body of left forearm, subsequent encounter: Secondary | ICD-10-CM | POA: Diagnosis not present

## 2017-12-26 DIAGNOSIS — S51811D Laceration without foreign body of right forearm, subsequent encounter: Secondary | ICD-10-CM | POA: Diagnosis not present

## 2017-12-26 DIAGNOSIS — I129 Hypertensive chronic kidney disease with stage 1 through stage 4 chronic kidney disease, or unspecified chronic kidney disease: Secondary | ICD-10-CM | POA: Diagnosis not present

## 2017-12-26 DIAGNOSIS — M150 Primary generalized (osteo)arthritis: Secondary | ICD-10-CM | POA: Diagnosis not present

## 2017-12-26 DIAGNOSIS — D631 Anemia in chronic kidney disease: Secondary | ICD-10-CM | POA: Diagnosis not present

## 2017-12-26 DIAGNOSIS — J449 Chronic obstructive pulmonary disease, unspecified: Secondary | ICD-10-CM | POA: Diagnosis not present

## 2017-12-31 DIAGNOSIS — K7460 Unspecified cirrhosis of liver: Secondary | ICD-10-CM | POA: Diagnosis not present

## 2017-12-31 DIAGNOSIS — K7581 Nonalcoholic steatohepatitis (NASH): Secondary | ICD-10-CM | POA: Diagnosis not present

## 2018-01-01 DIAGNOSIS — Z1231 Encounter for screening mammogram for malignant neoplasm of breast: Secondary | ICD-10-CM | POA: Diagnosis not present

## 2018-01-02 DIAGNOSIS — I129 Hypertensive chronic kidney disease with stage 1 through stage 4 chronic kidney disease, or unspecified chronic kidney disease: Secondary | ICD-10-CM | POA: Diagnosis not present

## 2018-01-02 DIAGNOSIS — K7290 Hepatic failure, unspecified without coma: Secondary | ICD-10-CM | POA: Diagnosis not present

## 2018-01-02 DIAGNOSIS — D631 Anemia in chronic kidney disease: Secondary | ICD-10-CM | POA: Diagnosis not present

## 2018-01-02 DIAGNOSIS — J449 Chronic obstructive pulmonary disease, unspecified: Secondary | ICD-10-CM | POA: Diagnosis not present

## 2018-01-02 DIAGNOSIS — S51811D Laceration without foreign body of right forearm, subsequent encounter: Secondary | ICD-10-CM | POA: Diagnosis not present

## 2018-01-02 DIAGNOSIS — M150 Primary generalized (osteo)arthritis: Secondary | ICD-10-CM | POA: Diagnosis not present

## 2018-01-02 DIAGNOSIS — K7581 Nonalcoholic steatohepatitis (NASH): Secondary | ICD-10-CM | POA: Diagnosis not present

## 2018-01-02 DIAGNOSIS — N184 Chronic kidney disease, stage 4 (severe): Secondary | ICD-10-CM | POA: Diagnosis not present

## 2018-01-02 DIAGNOSIS — S51812D Laceration without foreign body of left forearm, subsequent encounter: Secondary | ICD-10-CM | POA: Diagnosis not present

## 2018-01-07 DIAGNOSIS — D631 Anemia in chronic kidney disease: Secondary | ICD-10-CM | POA: Diagnosis not present

## 2018-01-07 DIAGNOSIS — N184 Chronic kidney disease, stage 4 (severe): Secondary | ICD-10-CM | POA: Diagnosis not present

## 2018-01-08 DIAGNOSIS — K7581 Nonalcoholic steatohepatitis (NASH): Secondary | ICD-10-CM | POA: Diagnosis not present

## 2018-01-08 DIAGNOSIS — S51812D Laceration without foreign body of left forearm, subsequent encounter: Secondary | ICD-10-CM | POA: Diagnosis not present

## 2018-01-08 DIAGNOSIS — D631 Anemia in chronic kidney disease: Secondary | ICD-10-CM | POA: Diagnosis not present

## 2018-01-08 DIAGNOSIS — J449 Chronic obstructive pulmonary disease, unspecified: Secondary | ICD-10-CM | POA: Diagnosis not present

## 2018-01-08 DIAGNOSIS — N184 Chronic kidney disease, stage 4 (severe): Secondary | ICD-10-CM | POA: Diagnosis not present

## 2018-01-08 DIAGNOSIS — K7290 Hepatic failure, unspecified without coma: Secondary | ICD-10-CM | POA: Diagnosis not present

## 2018-01-08 DIAGNOSIS — M150 Primary generalized (osteo)arthritis: Secondary | ICD-10-CM | POA: Diagnosis not present

## 2018-01-08 DIAGNOSIS — S51811D Laceration without foreign body of right forearm, subsequent encounter: Secondary | ICD-10-CM | POA: Diagnosis not present

## 2018-01-08 DIAGNOSIS — I129 Hypertensive chronic kidney disease with stage 1 through stage 4 chronic kidney disease, or unspecified chronic kidney disease: Secondary | ICD-10-CM | POA: Diagnosis not present

## 2018-01-20 DIAGNOSIS — I5032 Chronic diastolic (congestive) heart failure: Secondary | ICD-10-CM | POA: Diagnosis not present

## 2018-01-20 DIAGNOSIS — K766 Portal hypertension: Secondary | ICD-10-CM | POA: Diagnosis not present

## 2018-01-20 DIAGNOSIS — I10 Essential (primary) hypertension: Secondary | ICD-10-CM | POA: Diagnosis not present

## 2018-01-21 DIAGNOSIS — N184 Chronic kidney disease, stage 4 (severe): Secondary | ICD-10-CM | POA: Diagnosis not present

## 2018-01-21 DIAGNOSIS — R188 Other ascites: Secondary | ICD-10-CM | POA: Diagnosis not present

## 2018-01-21 DIAGNOSIS — Z87891 Personal history of nicotine dependence: Secondary | ICD-10-CM | POA: Diagnosis not present

## 2018-01-21 DIAGNOSIS — K7460 Unspecified cirrhosis of liver: Secondary | ICD-10-CM | POA: Diagnosis not present

## 2018-01-21 DIAGNOSIS — Z01818 Encounter for other preprocedural examination: Secondary | ICD-10-CM | POA: Diagnosis not present

## 2018-01-22 DIAGNOSIS — E1122 Type 2 diabetes mellitus with diabetic chronic kidney disease: Secondary | ICD-10-CM | POA: Diagnosis not present

## 2018-01-22 DIAGNOSIS — I868 Varicose veins of other specified sites: Secondary | ICD-10-CM | POA: Diagnosis not present

## 2018-01-22 DIAGNOSIS — N189 Chronic kidney disease, unspecified: Secondary | ICD-10-CM | POA: Diagnosis not present

## 2018-01-22 DIAGNOSIS — I130 Hypertensive heart and chronic kidney disease with heart failure and stage 1 through stage 4 chronic kidney disease, or unspecified chronic kidney disease: Secondary | ICD-10-CM | POA: Diagnosis not present

## 2018-01-22 DIAGNOSIS — Z23 Encounter for immunization: Secondary | ICD-10-CM | POA: Diagnosis not present

## 2018-01-22 DIAGNOSIS — Z6841 Body Mass Index (BMI) 40.0 and over, adult: Secondary | ICD-10-CM | POA: Diagnosis not present

## 2018-01-22 DIAGNOSIS — N184 Chronic kidney disease, stage 4 (severe): Secondary | ICD-10-CM | POA: Diagnosis not present

## 2018-01-22 DIAGNOSIS — K7460 Unspecified cirrhosis of liver: Secondary | ICD-10-CM | POA: Diagnosis not present

## 2018-01-22 DIAGNOSIS — Z7682 Awaiting organ transplant status: Secondary | ICD-10-CM | POA: Diagnosis not present

## 2018-01-22 DIAGNOSIS — K766 Portal hypertension: Secondary | ICD-10-CM | POA: Diagnosis not present

## 2018-01-22 DIAGNOSIS — I8510 Secondary esophageal varices without bleeding: Secondary | ICD-10-CM | POA: Diagnosis not present

## 2018-01-22 DIAGNOSIS — K7469 Other cirrhosis of liver: Secondary | ICD-10-CM | POA: Diagnosis not present

## 2018-01-22 DIAGNOSIS — I509 Heart failure, unspecified: Secondary | ICD-10-CM | POA: Diagnosis not present

## 2018-01-22 DIAGNOSIS — R188 Other ascites: Secondary | ICD-10-CM | POA: Diagnosis not present

## 2018-01-22 DIAGNOSIS — I8390 Asymptomatic varicose veins of unspecified lower extremity: Secondary | ICD-10-CM | POA: Diagnosis not present

## 2018-01-22 DIAGNOSIS — Z01818 Encounter for other preprocedural examination: Secondary | ICD-10-CM | POA: Diagnosis not present

## 2018-01-22 DIAGNOSIS — E785 Hyperlipidemia, unspecified: Secondary | ICD-10-CM | POA: Diagnosis not present

## 2018-01-23 DIAGNOSIS — Z79899 Other long term (current) drug therapy: Secondary | ICD-10-CM | POA: Diagnosis not present

## 2018-01-23 DIAGNOSIS — N184 Chronic kidney disease, stage 4 (severe): Secondary | ICD-10-CM | POA: Diagnosis not present

## 2018-01-23 DIAGNOSIS — Z01818 Encounter for other preprocedural examination: Secondary | ICD-10-CM | POA: Diagnosis not present

## 2018-01-23 DIAGNOSIS — K7460 Unspecified cirrhosis of liver: Secondary | ICD-10-CM | POA: Diagnosis not present

## 2018-01-23 DIAGNOSIS — Z7682 Awaiting organ transplant status: Secondary | ICD-10-CM | POA: Diagnosis not present

## 2018-01-23 DIAGNOSIS — R188 Other ascites: Secondary | ICD-10-CM | POA: Diagnosis not present

## 2018-01-23 DIAGNOSIS — Z87891 Personal history of nicotine dependence: Secondary | ICD-10-CM | POA: Diagnosis not present

## 2018-01-28 DIAGNOSIS — D631 Anemia in chronic kidney disease: Secondary | ICD-10-CM | POA: Diagnosis not present

## 2018-01-28 DIAGNOSIS — N184 Chronic kidney disease, stage 4 (severe): Secondary | ICD-10-CM | POA: Diagnosis not present

## 2018-01-31 DIAGNOSIS — Z87891 Personal history of nicotine dependence: Secondary | ICD-10-CM | POA: Diagnosis not present

## 2018-01-31 DIAGNOSIS — K7460 Unspecified cirrhosis of liver: Secondary | ICD-10-CM | POA: Diagnosis not present

## 2018-01-31 DIAGNOSIS — Z01818 Encounter for other preprocedural examination: Secondary | ICD-10-CM | POA: Diagnosis not present

## 2018-01-31 DIAGNOSIS — R188 Other ascites: Secondary | ICD-10-CM | POA: Diagnosis not present

## 2018-01-31 DIAGNOSIS — I509 Heart failure, unspecified: Secondary | ICD-10-CM | POA: Diagnosis not present

## 2018-01-31 DIAGNOSIS — N184 Chronic kidney disease, stage 4 (severe): Secondary | ICD-10-CM | POA: Diagnosis not present

## 2018-01-31 DIAGNOSIS — Z949 Transplanted organ and tissue status, unspecified: Secondary | ICD-10-CM | POA: Diagnosis not present

## 2018-02-02 IMAGING — DX DG CHEST 1V PORT
1 series · 1 of 1 positions shown · non-contrast
Comparison: Portable exam 5737 hours compared to 05/18/2016

CLINICAL DATA: Encounter for central line placement, history
hypertension, morbid obesity, COPD, chronic kidney disease

EXAM:
PORTABLE CHEST 1 VIEW

[chest ap]
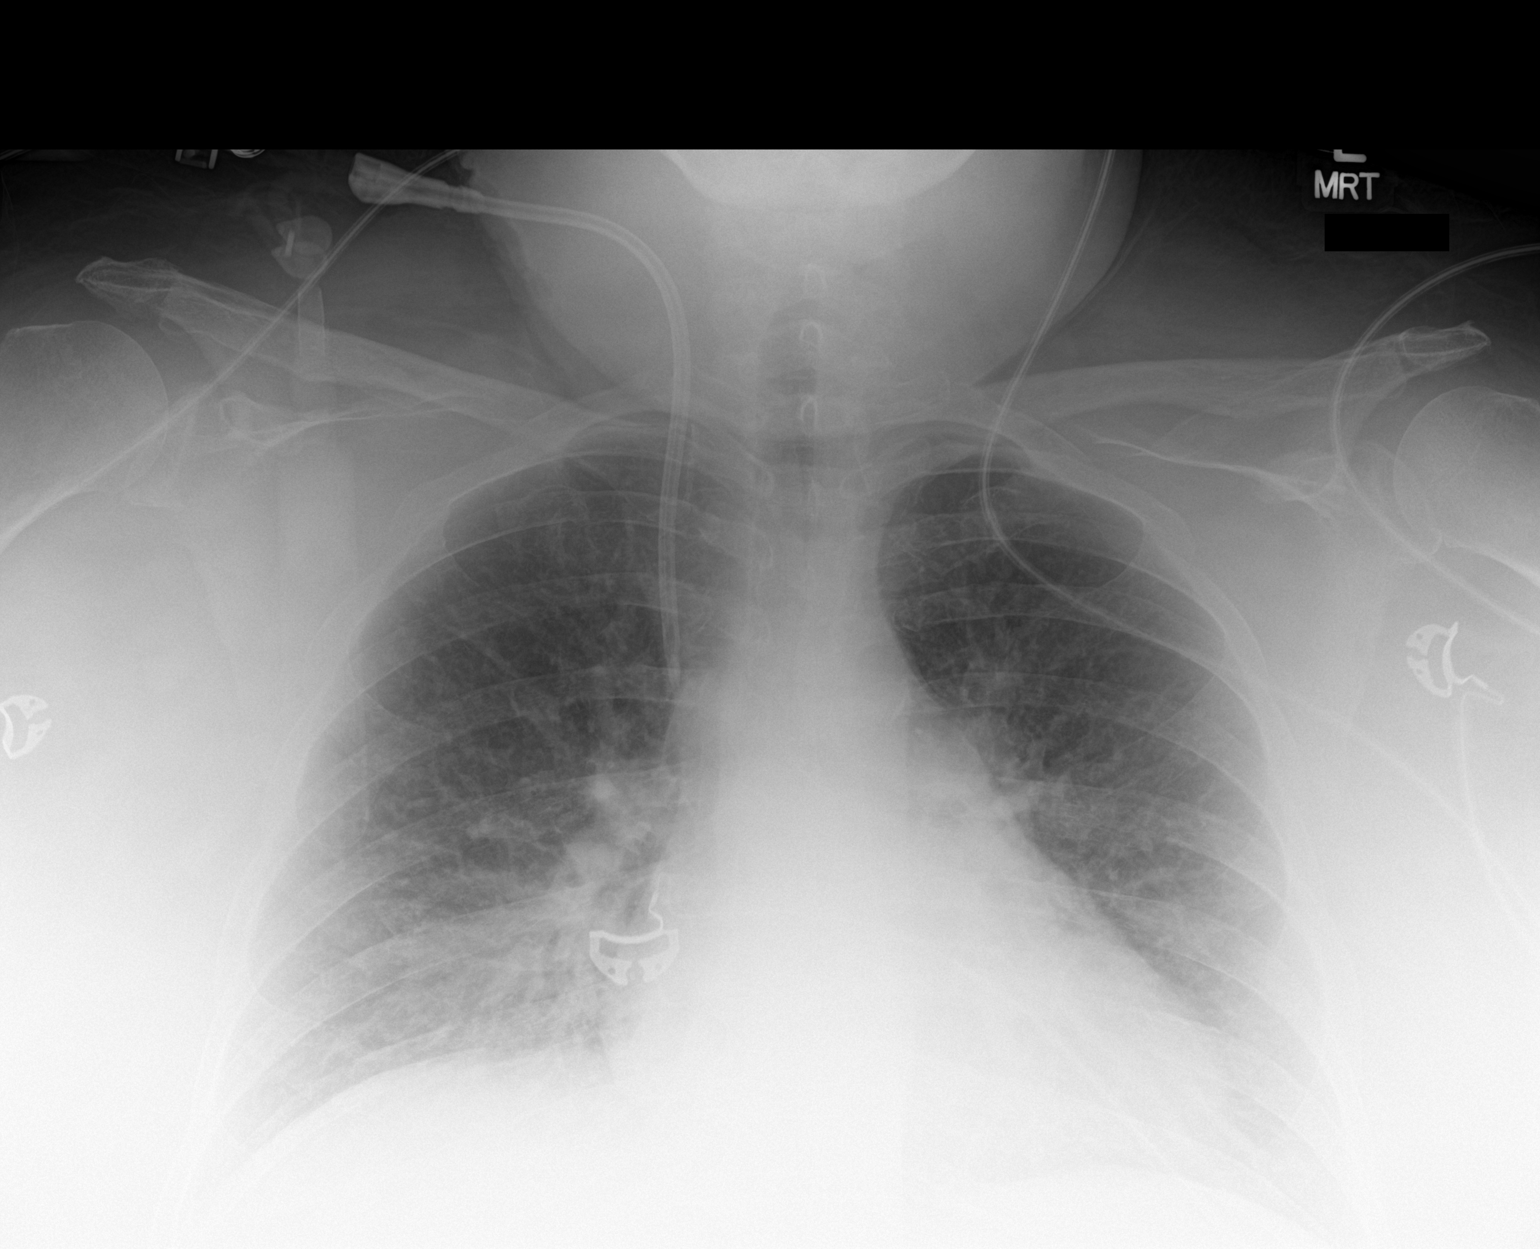

[1 of 1 positions shown; findings below may reference images not displayed]

FINDINGS: RIGHT jugular line with tip projecting over SVC.

Borderline enlargement of cardiac silhouette.

Mediastinal contours and pulmonary vascularity normal.

Atelectasis versus infiltrate at medial RIGHT lung base.

Remaining lungs clear.

No pleural effusion or pneumothorax.

Bones demineralized.
IMPRESSION: No pneumothorax following central line placement.

Atelectasis versus infiltrate in medial RIGHT lower lobe.

## 2018-02-04 IMAGING — CR DG CHEST 1V PORT
1 series · 1 of 1 positions shown · non-contrast
Comparison: 08/04/2016

CLINICAL DATA: Acute renal failure

EXAM:
PORTABLE CHEST 1 VIEW

[AP]
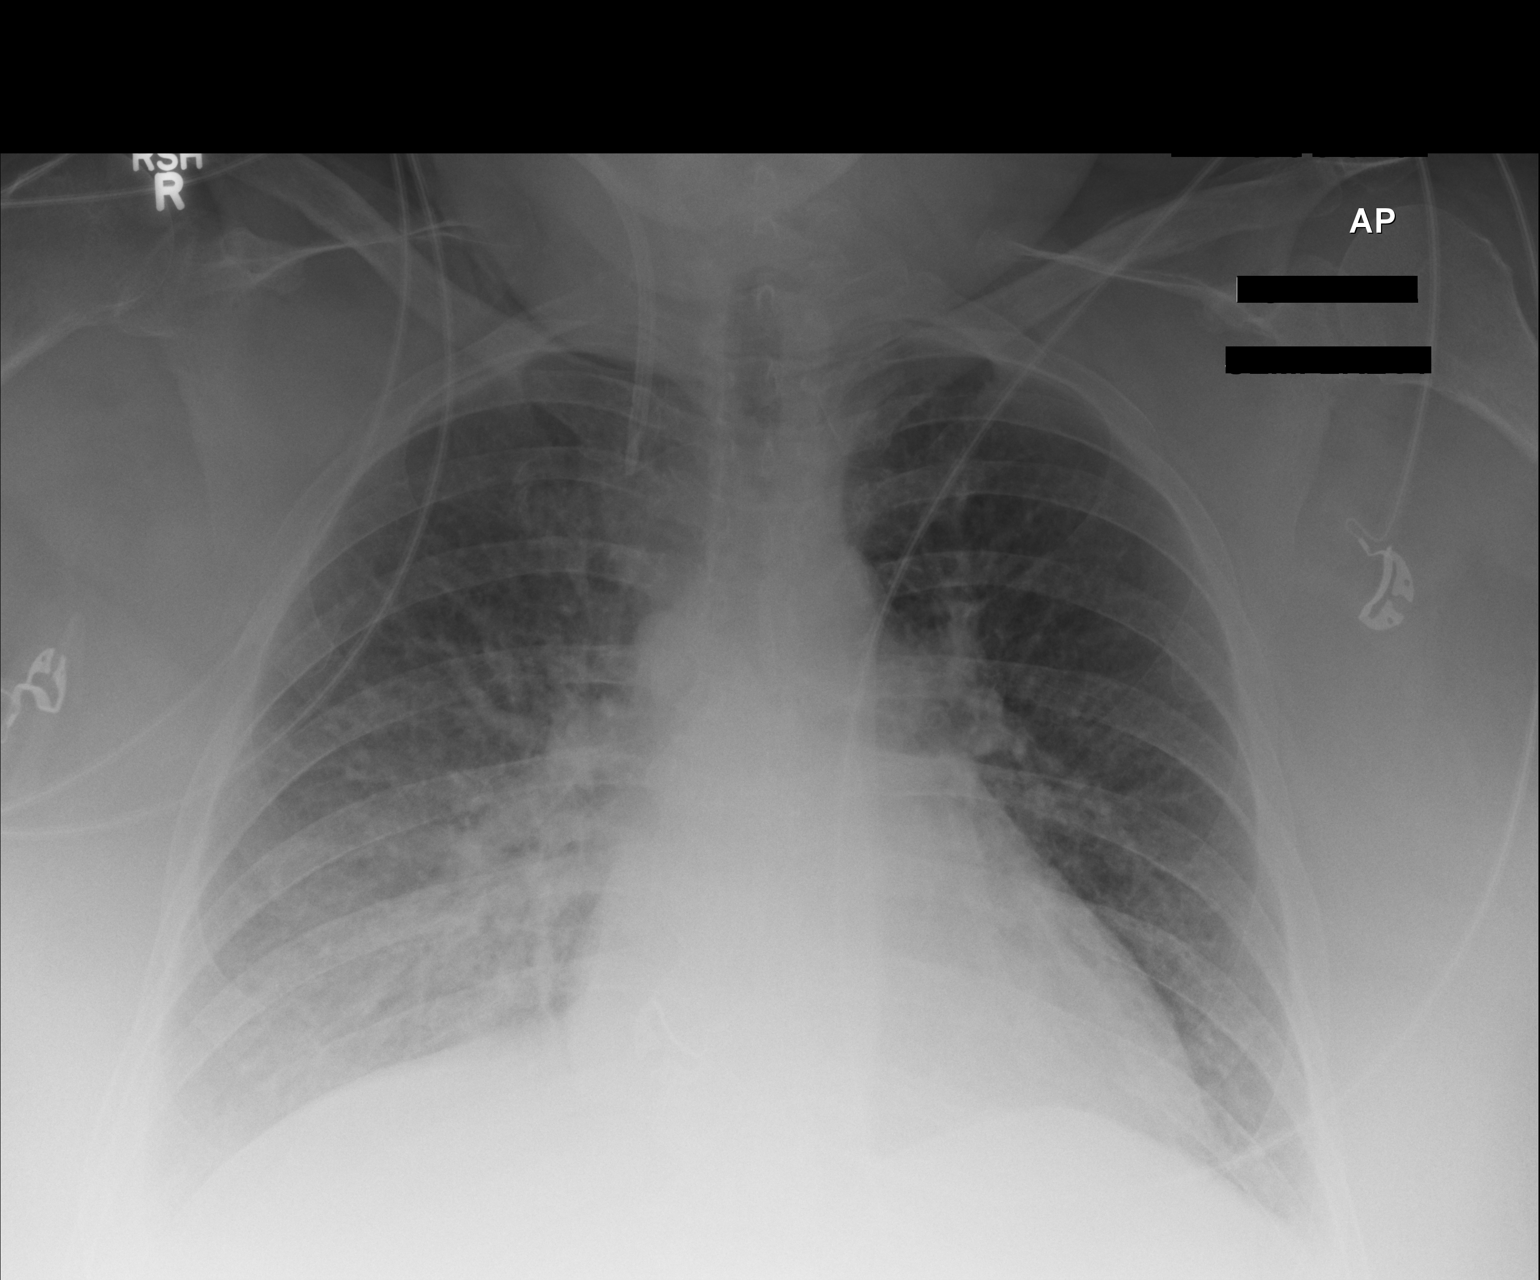

[1 of 1 positions shown; findings below may reference images not displayed]

FINDINGS: Right jugular central venous catheter is been pulled back into the
jugular vein.

Progression of vascular congestion and bilateral airspace disease
most compatible with mild edema. No effusion.
IMPRESSION: Right jugular catheter has been pulled back with the tip now in the
lower jugular vein

Progression of bilateral airspace disease and vascular congestion
most likely due to edema versus pneumonia.

## 2018-02-11 DIAGNOSIS — N184 Chronic kidney disease, stage 4 (severe): Secondary | ICD-10-CM | POA: Diagnosis not present

## 2018-02-11 DIAGNOSIS — D631 Anemia in chronic kidney disease: Secondary | ICD-10-CM | POA: Diagnosis not present

## 2018-02-13 DIAGNOSIS — I129 Hypertensive chronic kidney disease with stage 1 through stage 4 chronic kidney disease, or unspecified chronic kidney disease: Secondary | ICD-10-CM | POA: Diagnosis not present

## 2018-02-13 DIAGNOSIS — N184 Chronic kidney disease, stage 4 (severe): Secondary | ICD-10-CM | POA: Diagnosis not present

## 2018-02-13 DIAGNOSIS — K7581 Nonalcoholic steatohepatitis (NASH): Secondary | ICD-10-CM | POA: Diagnosis not present

## 2018-02-13 DIAGNOSIS — K7460 Unspecified cirrhosis of liver: Secondary | ICD-10-CM | POA: Diagnosis not present

## 2018-02-13 DIAGNOSIS — N2581 Secondary hyperparathyroidism of renal origin: Secondary | ICD-10-CM | POA: Diagnosis not present

## 2018-02-13 DIAGNOSIS — K767 Hepatorenal syndrome: Secondary | ICD-10-CM | POA: Diagnosis not present

## 2018-02-13 DIAGNOSIS — D631 Anemia in chronic kidney disease: Secondary | ICD-10-CM | POA: Diagnosis not present

## 2018-02-14 IMAGING — CR DG KNEE 1-2V*L*
2 series · 2 of 2 positions shown · non-contrast
Comparison: None.

CLINICAL DATA: Recent fall, bilateral knee pain left greater than
right

EXAM:
LEFT KNEE - 1-2 VIEW

[knee ap]
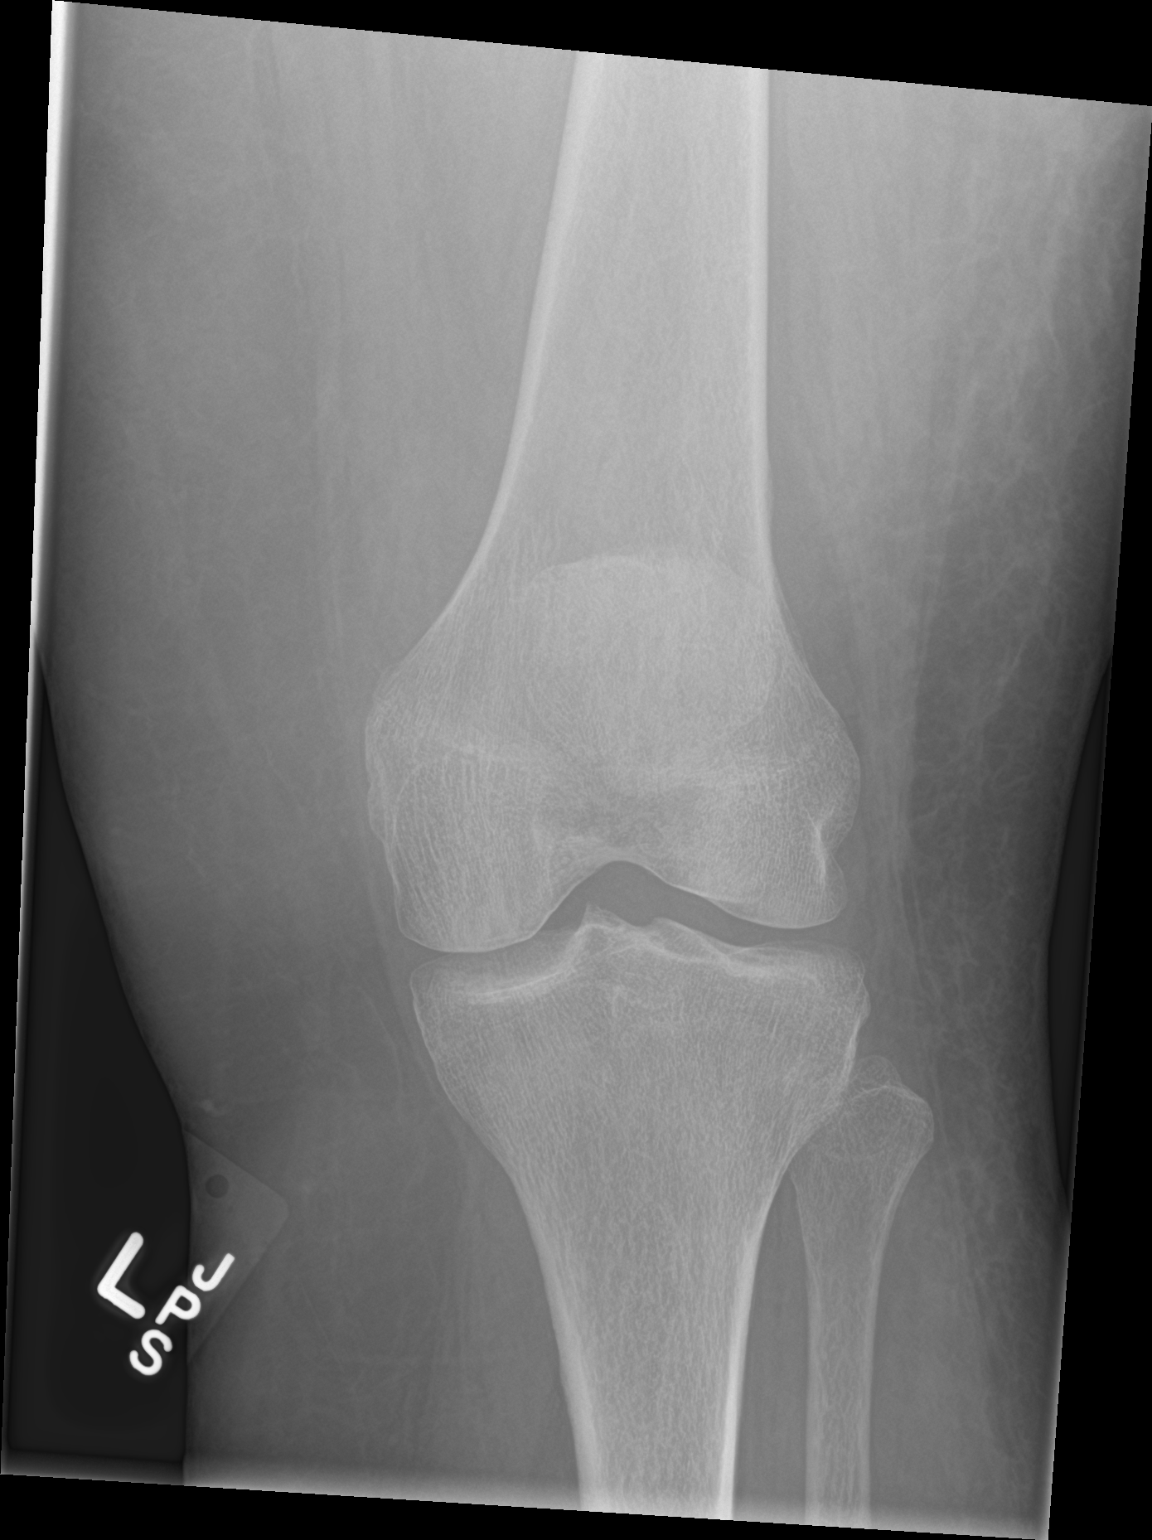

[knee lat]
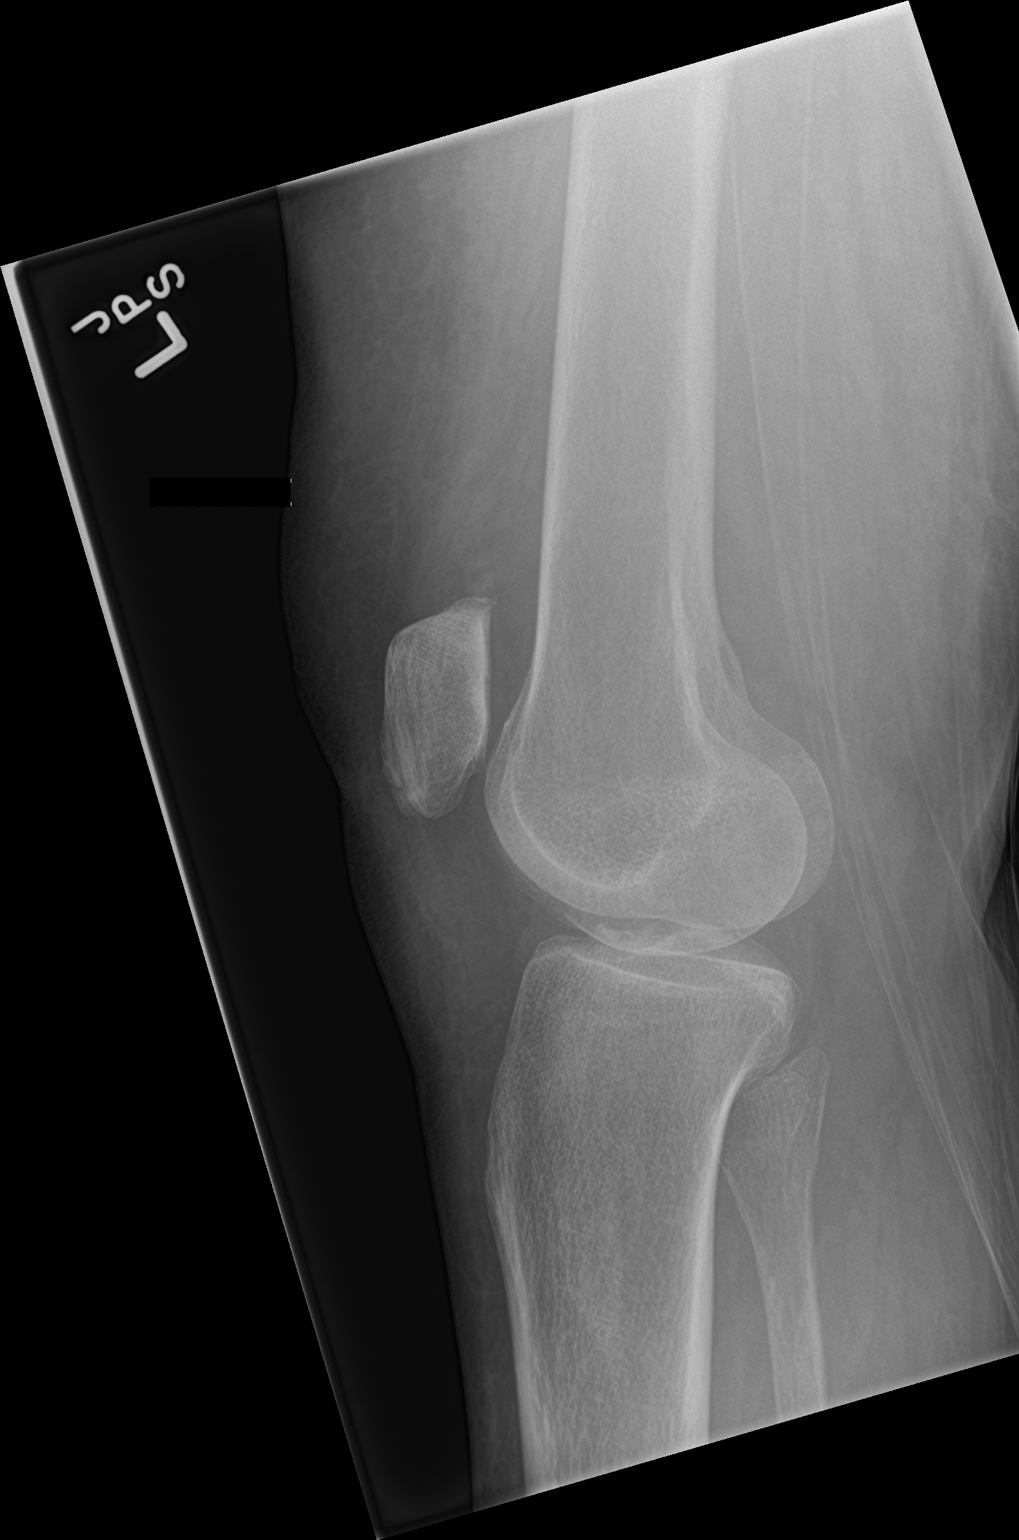

[2 of 2 positions shown; findings below may reference images not displayed]

FINDINGS: Two views of the left knee submitted. No acute fracture or
subluxation. Mild narrowing of medial joint compartment. No joint
effusion.
IMPRESSION: No acute fracture or subluxation. Mild narrowing of medial joint
compartment.

## 2018-02-17 DIAGNOSIS — K7460 Unspecified cirrhosis of liver: Secondary | ICD-10-CM | POA: Diagnosis not present

## 2018-02-17 DIAGNOSIS — R188 Other ascites: Secondary | ICD-10-CM | POA: Diagnosis not present

## 2018-02-25 DIAGNOSIS — D631 Anemia in chronic kidney disease: Secondary | ICD-10-CM | POA: Diagnosis not present

## 2018-02-25 DIAGNOSIS — N184 Chronic kidney disease, stage 4 (severe): Secondary | ICD-10-CM | POA: Diagnosis not present

## 2018-03-04 DIAGNOSIS — K7460 Unspecified cirrhosis of liver: Secondary | ICD-10-CM | POA: Diagnosis not present

## 2018-03-04 DIAGNOSIS — R188 Other ascites: Secondary | ICD-10-CM | POA: Diagnosis not present

## 2018-03-11 DIAGNOSIS — K7460 Unspecified cirrhosis of liver: Secondary | ICD-10-CM | POA: Diagnosis not present

## 2018-03-11 DIAGNOSIS — R188 Other ascites: Secondary | ICD-10-CM | POA: Diagnosis not present

## 2018-03-11 DIAGNOSIS — N184 Chronic kidney disease, stage 4 (severe): Secondary | ICD-10-CM | POA: Diagnosis not present

## 2018-03-11 DIAGNOSIS — D631 Anemia in chronic kidney disease: Secondary | ICD-10-CM | POA: Diagnosis not present

## 2018-03-13 DIAGNOSIS — K766 Portal hypertension: Secondary | ICD-10-CM | POA: Diagnosis not present

## 2018-03-13 DIAGNOSIS — R34 Anuria and oliguria: Secondary | ICD-10-CM | POA: Diagnosis not present

## 2018-03-13 DIAGNOSIS — K811 Chronic cholecystitis: Secondary | ICD-10-CM | POA: Diagnosis not present

## 2018-03-13 DIAGNOSIS — Z4682 Encounter for fitting and adjustment of non-vascular catheter: Secondary | ICD-10-CM | POA: Diagnosis not present

## 2018-03-13 DIAGNOSIS — Z6841 Body Mass Index (BMI) 40.0 and over, adult: Secondary | ICD-10-CM | POA: Diagnosis not present

## 2018-03-13 DIAGNOSIS — K838 Other specified diseases of biliary tract: Secondary | ICD-10-CM | POA: Diagnosis not present

## 2018-03-13 DIAGNOSIS — E878 Other disorders of electrolyte and fluid balance, not elsewhere classified: Secondary | ICD-10-CM | POA: Diagnosis not present

## 2018-03-13 DIAGNOSIS — R918 Other nonspecific abnormal finding of lung field: Secondary | ICD-10-CM | POA: Diagnosis not present

## 2018-03-13 DIAGNOSIS — M7989 Other specified soft tissue disorders: Secondary | ICD-10-CM | POA: Diagnosis not present

## 2018-03-13 DIAGNOSIS — K767 Hepatorenal syndrome: Secondary | ICD-10-CM | POA: Diagnosis not present

## 2018-03-13 DIAGNOSIS — N189 Chronic kidney disease, unspecified: Secondary | ICD-10-CM | POA: Diagnosis not present

## 2018-03-13 DIAGNOSIS — J9602 Acute respiratory failure with hypercapnia: Secondary | ICD-10-CM | POA: Diagnosis not present

## 2018-03-13 DIAGNOSIS — Z7682 Awaiting organ transplant status: Secondary | ICD-10-CM | POA: Diagnosis not present

## 2018-03-13 DIAGNOSIS — Z9049 Acquired absence of other specified parts of digestive tract: Secondary | ICD-10-CM | POA: Diagnosis not present

## 2018-03-13 DIAGNOSIS — Z9884 Bariatric surgery status: Secondary | ICD-10-CM | POA: Diagnosis not present

## 2018-03-13 DIAGNOSIS — D696 Thrombocytopenia, unspecified: Secondary | ICD-10-CM | POA: Diagnosis not present

## 2018-03-13 DIAGNOSIS — I509 Heart failure, unspecified: Secondary | ICD-10-CM | POA: Diagnosis not present

## 2018-03-13 DIAGNOSIS — I5030 Unspecified diastolic (congestive) heart failure: Secondary | ICD-10-CM | POA: Diagnosis not present

## 2018-03-13 DIAGNOSIS — B1910 Unspecified viral hepatitis B without hepatic coma: Secondary | ICD-10-CM | POA: Diagnosis not present

## 2018-03-13 DIAGNOSIS — E8770 Fluid overload, unspecified: Secondary | ICD-10-CM | POA: Diagnosis not present

## 2018-03-13 DIAGNOSIS — Z944 Liver transplant status: Secondary | ICD-10-CM | POA: Diagnosis not present

## 2018-03-13 DIAGNOSIS — I5032 Chronic diastolic (congestive) heart failure: Secondary | ICD-10-CM | POA: Diagnosis not present

## 2018-03-13 DIAGNOSIS — E874 Mixed disorder of acid-base balance: Secondary | ICD-10-CM | POA: Diagnosis not present

## 2018-03-13 DIAGNOSIS — Z940 Kidney transplant status: Secondary | ICD-10-CM | POA: Diagnosis not present

## 2018-03-13 DIAGNOSIS — Z792 Long term (current) use of antibiotics: Secondary | ICD-10-CM | POA: Diagnosis not present

## 2018-03-13 DIAGNOSIS — T8619 Other complication of kidney transplant: Secondary | ICD-10-CM | POA: Diagnosis not present

## 2018-03-13 DIAGNOSIS — K9189 Other postprocedural complications and disorders of digestive system: Secondary | ICD-10-CM | POA: Diagnosis not present

## 2018-03-13 DIAGNOSIS — Z949 Transplanted organ and tissue status, unspecified: Secondary | ICD-10-CM | POA: Diagnosis not present

## 2018-03-13 DIAGNOSIS — T380X5A Adverse effect of glucocorticoids and synthetic analogues, initial encounter: Secondary | ICD-10-CM | POA: Diagnosis not present

## 2018-03-13 DIAGNOSIS — G8918 Other acute postprocedural pain: Secondary | ICD-10-CM | POA: Diagnosis not present

## 2018-03-13 DIAGNOSIS — J9600 Acute respiratory failure, unspecified whether with hypoxia or hypercapnia: Secondary | ICD-10-CM | POA: Diagnosis not present

## 2018-03-13 DIAGNOSIS — I9581 Postprocedural hypotension: Secondary | ICD-10-CM | POA: Diagnosis not present

## 2018-03-13 DIAGNOSIS — N186 End stage renal disease: Secondary | ICD-10-CM | POA: Diagnosis not present

## 2018-03-13 DIAGNOSIS — Z960 Presence of urogenital implants: Secondary | ICD-10-CM | POA: Diagnosis not present

## 2018-03-13 DIAGNOSIS — M6281 Muscle weakness (generalized): Secondary | ICD-10-CM | POA: Diagnosis not present

## 2018-03-13 DIAGNOSIS — I10 Essential (primary) hypertension: Secondary | ICD-10-CM | POA: Diagnosis not present

## 2018-03-13 DIAGNOSIS — I132 Hypertensive heart and chronic kidney disease with heart failure and with stage 5 chronic kidney disease, or end stage renal disease: Secondary | ICD-10-CM | POA: Diagnosis not present

## 2018-03-13 DIAGNOSIS — I130 Hypertensive heart and chronic kidney disease with heart failure and stage 1 through stage 4 chronic kidney disease, or unspecified chronic kidney disease: Secondary | ICD-10-CM | POA: Diagnosis not present

## 2018-03-13 DIAGNOSIS — E872 Acidosis: Secondary | ICD-10-CM | POA: Diagnosis not present

## 2018-03-13 DIAGNOSIS — R570 Cardiogenic shock: Secondary | ICD-10-CM | POA: Diagnosis not present

## 2018-03-13 DIAGNOSIS — J9601 Acute respiratory failure with hypoxia: Secondary | ICD-10-CM | POA: Diagnosis not present

## 2018-03-13 DIAGNOSIS — D62 Acute posthemorrhagic anemia: Secondary | ICD-10-CM | POA: Diagnosis not present

## 2018-03-13 DIAGNOSIS — R69 Illness, unspecified: Secondary | ICD-10-CM | POA: Diagnosis not present

## 2018-03-13 DIAGNOSIS — D689 Coagulation defect, unspecified: Secondary | ICD-10-CM | POA: Diagnosis not present

## 2018-03-13 DIAGNOSIS — R8279 Other abnormal findings on microbiological examination of urine: Secondary | ICD-10-CM | POA: Diagnosis not present

## 2018-03-13 DIAGNOSIS — N184 Chronic kidney disease, stage 4 (severe): Secondary | ICD-10-CM | POA: Diagnosis not present

## 2018-03-13 DIAGNOSIS — D899 Disorder involving the immune mechanism, unspecified: Secondary | ICD-10-CM | POA: Diagnosis not present

## 2018-03-13 DIAGNOSIS — K7460 Unspecified cirrhosis of liver: Secondary | ICD-10-CM | POA: Diagnosis not present

## 2018-03-13 DIAGNOSIS — D8989 Other specified disorders involving the immune mechanism, not elsewhere classified: Secondary | ICD-10-CM | POA: Diagnosis not present

## 2018-03-13 DIAGNOSIS — Z9911 Dependence on respirator [ventilator] status: Secondary | ICD-10-CM | POA: Diagnosis not present

## 2018-03-13 DIAGNOSIS — K7581 Nonalcoholic steatohepatitis (NASH): Secondary | ICD-10-CM | POA: Diagnosis not present

## 2018-03-13 DIAGNOSIS — R739 Hyperglycemia, unspecified: Secondary | ICD-10-CM | POA: Diagnosis not present

## 2018-03-13 DIAGNOSIS — R579 Shock, unspecified: Secondary | ICD-10-CM | POA: Diagnosis not present

## 2018-03-13 DIAGNOSIS — N390 Urinary tract infection, site not specified: Secondary | ICD-10-CM | POA: Diagnosis not present

## 2018-03-13 DIAGNOSIS — Z005 Encounter for examination of potential donor of organ and tissue: Secondary | ICD-10-CM | POA: Diagnosis not present

## 2018-03-13 DIAGNOSIS — N179 Acute kidney failure, unspecified: Secondary | ICD-10-CM | POA: Diagnosis not present

## 2018-03-14 DIAGNOSIS — Z944 Liver transplant status: Secondary | ICD-10-CM | POA: Diagnosis not present

## 2018-03-14 DIAGNOSIS — D62 Acute posthemorrhagic anemia: Secondary | ICD-10-CM | POA: Diagnosis not present

## 2018-03-14 DIAGNOSIS — N179 Acute kidney failure, unspecified: Secondary | ICD-10-CM | POA: Diagnosis not present

## 2018-03-14 DIAGNOSIS — N189 Chronic kidney disease, unspecified: Secondary | ICD-10-CM | POA: Diagnosis not present

## 2018-03-14 DIAGNOSIS — R570 Cardiogenic shock: Secondary | ICD-10-CM | POA: Diagnosis not present

## 2018-03-14 DIAGNOSIS — R918 Other nonspecific abnormal finding of lung field: Secondary | ICD-10-CM | POA: Diagnosis not present

## 2018-03-14 DIAGNOSIS — K811 Chronic cholecystitis: Secondary | ICD-10-CM | POA: Diagnosis not present

## 2018-03-14 DIAGNOSIS — Z9911 Dependence on respirator [ventilator] status: Secondary | ICD-10-CM | POA: Diagnosis not present

## 2018-03-14 DIAGNOSIS — Z9049 Acquired absence of other specified parts of digestive tract: Secondary | ICD-10-CM | POA: Diagnosis not present

## 2018-03-14 DIAGNOSIS — Z940 Kidney transplant status: Secondary | ICD-10-CM | POA: Diagnosis not present

## 2018-03-14 DIAGNOSIS — Z7682 Awaiting organ transplant status: Secondary | ICD-10-CM | POA: Diagnosis not present

## 2018-03-14 DIAGNOSIS — J9600 Acute respiratory failure, unspecified whether with hypoxia or hypercapnia: Secondary | ICD-10-CM | POA: Diagnosis not present

## 2018-03-14 DIAGNOSIS — T380X5A Adverse effect of glucocorticoids and synthetic analogues, initial encounter: Secondary | ICD-10-CM | POA: Diagnosis not present

## 2018-03-14 DIAGNOSIS — K9189 Other postprocedural complications and disorders of digestive system: Secondary | ICD-10-CM | POA: Diagnosis not present

## 2018-03-14 DIAGNOSIS — K7581 Nonalcoholic steatohepatitis (NASH): Secondary | ICD-10-CM | POA: Diagnosis not present

## 2018-03-14 DIAGNOSIS — K7460 Unspecified cirrhosis of liver: Secondary | ICD-10-CM | POA: Diagnosis not present

## 2018-03-14 DIAGNOSIS — Z6841 Body Mass Index (BMI) 40.0 and over, adult: Secondary | ICD-10-CM | POA: Diagnosis not present

## 2018-03-14 DIAGNOSIS — Z9884 Bariatric surgery status: Secondary | ICD-10-CM | POA: Diagnosis not present

## 2018-03-14 DIAGNOSIS — E872 Acidosis: Secondary | ICD-10-CM | POA: Diagnosis not present

## 2018-03-14 DIAGNOSIS — Z4682 Encounter for fitting and adjustment of non-vascular catheter: Secondary | ICD-10-CM | POA: Diagnosis not present

## 2018-03-14 DIAGNOSIS — R739 Hyperglycemia, unspecified: Secondary | ICD-10-CM | POA: Diagnosis not present

## 2018-03-14 DIAGNOSIS — K766 Portal hypertension: Secondary | ICD-10-CM | POA: Diagnosis not present

## 2018-03-14 DIAGNOSIS — R579 Shock, unspecified: Secondary | ICD-10-CM | POA: Diagnosis not present

## 2018-03-15 DIAGNOSIS — N179 Acute kidney failure, unspecified: Secondary | ICD-10-CM | POA: Diagnosis not present

## 2018-03-15 DIAGNOSIS — D899 Disorder involving the immune mechanism, unspecified: Secondary | ICD-10-CM | POA: Diagnosis not present

## 2018-03-15 DIAGNOSIS — K7581 Nonalcoholic steatohepatitis (NASH): Secondary | ICD-10-CM | POA: Diagnosis not present

## 2018-03-15 DIAGNOSIS — Z9911 Dependence on respirator [ventilator] status: Secondary | ICD-10-CM | POA: Diagnosis not present

## 2018-03-15 DIAGNOSIS — I509 Heart failure, unspecified: Secondary | ICD-10-CM | POA: Diagnosis not present

## 2018-03-15 DIAGNOSIS — R570 Cardiogenic shock: Secondary | ICD-10-CM | POA: Diagnosis not present

## 2018-03-15 DIAGNOSIS — D62 Acute posthemorrhagic anemia: Secondary | ICD-10-CM | POA: Diagnosis not present

## 2018-03-15 DIAGNOSIS — Z7682 Awaiting organ transplant status: Secondary | ICD-10-CM | POA: Diagnosis not present

## 2018-03-15 DIAGNOSIS — Z944 Liver transplant status: Secondary | ICD-10-CM | POA: Diagnosis not present

## 2018-03-15 DIAGNOSIS — I130 Hypertensive heart and chronic kidney disease with heart failure and stage 1 through stage 4 chronic kidney disease, or unspecified chronic kidney disease: Secondary | ICD-10-CM | POA: Diagnosis not present

## 2018-03-15 DIAGNOSIS — Z940 Kidney transplant status: Secondary | ICD-10-CM | POA: Diagnosis not present

## 2018-03-15 DIAGNOSIS — K7460 Unspecified cirrhosis of liver: Secondary | ICD-10-CM | POA: Diagnosis not present

## 2018-03-15 DIAGNOSIS — N186 End stage renal disease: Secondary | ICD-10-CM | POA: Diagnosis not present

## 2018-03-15 DIAGNOSIS — N184 Chronic kidney disease, stage 4 (severe): Secondary | ICD-10-CM | POA: Diagnosis not present

## 2018-03-15 DIAGNOSIS — K767 Hepatorenal syndrome: Secondary | ICD-10-CM | POA: Diagnosis not present

## 2018-03-15 DIAGNOSIS — N189 Chronic kidney disease, unspecified: Secondary | ICD-10-CM | POA: Diagnosis not present

## 2018-03-15 DIAGNOSIS — E872 Acidosis: Secondary | ICD-10-CM | POA: Diagnosis not present

## 2018-03-15 DIAGNOSIS — J9600 Acute respiratory failure, unspecified whether with hypoxia or hypercapnia: Secondary | ICD-10-CM | POA: Diagnosis not present

## 2018-03-15 DIAGNOSIS — R8279 Other abnormal findings on microbiological examination of urine: Secondary | ICD-10-CM | POA: Diagnosis not present

## 2018-03-16 DIAGNOSIS — Z940 Kidney transplant status: Secondary | ICD-10-CM | POA: Diagnosis not present

## 2018-03-16 DIAGNOSIS — R739 Hyperglycemia, unspecified: Secondary | ICD-10-CM | POA: Diagnosis not present

## 2018-03-16 DIAGNOSIS — N189 Chronic kidney disease, unspecified: Secondary | ICD-10-CM | POA: Diagnosis not present

## 2018-03-16 DIAGNOSIS — J9600 Acute respiratory failure, unspecified whether with hypoxia or hypercapnia: Secondary | ICD-10-CM | POA: Diagnosis not present

## 2018-03-16 DIAGNOSIS — D62 Acute posthemorrhagic anemia: Secondary | ICD-10-CM | POA: Diagnosis not present

## 2018-03-16 DIAGNOSIS — N179 Acute kidney failure, unspecified: Secondary | ICD-10-CM | POA: Diagnosis not present

## 2018-03-16 DIAGNOSIS — D899 Disorder involving the immune mechanism, unspecified: Secondary | ICD-10-CM | POA: Diagnosis not present

## 2018-03-16 DIAGNOSIS — E872 Acidosis: Secondary | ICD-10-CM | POA: Diagnosis not present

## 2018-03-16 DIAGNOSIS — Z7682 Awaiting organ transplant status: Secondary | ICD-10-CM | POA: Diagnosis not present

## 2018-03-16 DIAGNOSIS — R570 Cardiogenic shock: Secondary | ICD-10-CM | POA: Diagnosis not present

## 2018-03-16 DIAGNOSIS — I9581 Postprocedural hypotension: Secondary | ICD-10-CM | POA: Diagnosis not present

## 2018-03-16 DIAGNOSIS — Z9911 Dependence on respirator [ventilator] status: Secondary | ICD-10-CM | POA: Diagnosis not present

## 2018-03-16 DIAGNOSIS — T380X5A Adverse effect of glucocorticoids and synthetic analogues, initial encounter: Secondary | ICD-10-CM | POA: Diagnosis not present

## 2018-03-16 DIAGNOSIS — R34 Anuria and oliguria: Secondary | ICD-10-CM | POA: Diagnosis not present

## 2018-03-16 DIAGNOSIS — N184 Chronic kidney disease, stage 4 (severe): Secondary | ICD-10-CM | POA: Diagnosis not present

## 2018-03-17 DIAGNOSIS — G8918 Other acute postprocedural pain: Secondary | ICD-10-CM | POA: Diagnosis not present

## 2018-03-17 DIAGNOSIS — K9189 Other postprocedural complications and disorders of digestive system: Secondary | ICD-10-CM | POA: Diagnosis not present

## 2018-03-17 DIAGNOSIS — I5032 Chronic diastolic (congestive) heart failure: Secondary | ICD-10-CM | POA: Diagnosis not present

## 2018-03-17 DIAGNOSIS — K7460 Unspecified cirrhosis of liver: Secondary | ICD-10-CM | POA: Diagnosis not present

## 2018-03-17 DIAGNOSIS — N179 Acute kidney failure, unspecified: Secondary | ICD-10-CM | POA: Diagnosis not present

## 2018-03-17 DIAGNOSIS — Z7682 Awaiting organ transplant status: Secondary | ICD-10-CM | POA: Diagnosis not present

## 2018-03-17 DIAGNOSIS — Z944 Liver transplant status: Secondary | ICD-10-CM | POA: Diagnosis not present

## 2018-03-17 DIAGNOSIS — R34 Anuria and oliguria: Secondary | ICD-10-CM | POA: Diagnosis not present

## 2018-03-17 DIAGNOSIS — J9601 Acute respiratory failure with hypoxia: Secondary | ICD-10-CM | POA: Diagnosis not present

## 2018-03-17 DIAGNOSIS — Z940 Kidney transplant status: Secondary | ICD-10-CM | POA: Diagnosis not present

## 2018-03-17 DIAGNOSIS — D62 Acute posthemorrhagic anemia: Secondary | ICD-10-CM | POA: Diagnosis not present

## 2018-03-17 DIAGNOSIS — R570 Cardiogenic shock: Secondary | ICD-10-CM | POA: Diagnosis not present

## 2018-03-17 DIAGNOSIS — N184 Chronic kidney disease, stage 4 (severe): Secondary | ICD-10-CM | POA: Diagnosis not present

## 2018-03-17 DIAGNOSIS — I10 Essential (primary) hypertension: Secondary | ICD-10-CM | POA: Diagnosis not present

## 2018-03-17 DIAGNOSIS — T380X5A Adverse effect of glucocorticoids and synthetic analogues, initial encounter: Secondary | ICD-10-CM | POA: Diagnosis not present

## 2018-03-17 DIAGNOSIS — Z005 Encounter for examination of potential donor of organ and tissue: Secondary | ICD-10-CM | POA: Diagnosis not present

## 2018-03-17 DIAGNOSIS — N186 End stage renal disease: Secondary | ICD-10-CM | POA: Diagnosis not present

## 2018-03-17 DIAGNOSIS — R739 Hyperglycemia, unspecified: Secondary | ICD-10-CM | POA: Diagnosis not present

## 2018-03-17 DIAGNOSIS — Z9049 Acquired absence of other specified parts of digestive tract: Secondary | ICD-10-CM | POA: Diagnosis not present

## 2018-03-17 DIAGNOSIS — E878 Other disorders of electrolyte and fluid balance, not elsewhere classified: Secondary | ICD-10-CM | POA: Diagnosis not present

## 2018-03-17 DIAGNOSIS — Z9884 Bariatric surgery status: Secondary | ICD-10-CM | POA: Diagnosis not present

## 2018-03-17 DIAGNOSIS — D899 Disorder involving the immune mechanism, unspecified: Secondary | ICD-10-CM | POA: Diagnosis not present

## 2018-03-17 DIAGNOSIS — K766 Portal hypertension: Secondary | ICD-10-CM | POA: Diagnosis not present

## 2018-03-17 DIAGNOSIS — Z949 Transplanted organ and tissue status, unspecified: Secondary | ICD-10-CM | POA: Diagnosis not present

## 2018-03-17 DIAGNOSIS — Z792 Long term (current) use of antibiotics: Secondary | ICD-10-CM | POA: Diagnosis not present

## 2018-03-17 DIAGNOSIS — T8619 Other complication of kidney transplant: Secondary | ICD-10-CM | POA: Diagnosis not present

## 2018-03-17 DIAGNOSIS — Z6841 Body Mass Index (BMI) 40.0 and over, adult: Secondary | ICD-10-CM | POA: Diagnosis not present

## 2018-03-17 DIAGNOSIS — E872 Acidosis: Secondary | ICD-10-CM | POA: Diagnosis not present

## 2018-03-17 DIAGNOSIS — K7581 Nonalcoholic steatohepatitis (NASH): Secondary | ICD-10-CM | POA: Diagnosis not present

## 2018-03-18 DIAGNOSIS — T8619 Other complication of kidney transplant: Secondary | ICD-10-CM | POA: Diagnosis not present

## 2018-03-18 DIAGNOSIS — K838 Other specified diseases of biliary tract: Secondary | ICD-10-CM | POA: Diagnosis not present

## 2018-03-18 DIAGNOSIS — G8918 Other acute postprocedural pain: Secondary | ICD-10-CM | POA: Diagnosis not present

## 2018-03-18 DIAGNOSIS — K9189 Other postprocedural complications and disorders of digestive system: Secondary | ICD-10-CM | POA: Diagnosis not present

## 2018-03-18 DIAGNOSIS — N184 Chronic kidney disease, stage 4 (severe): Secondary | ICD-10-CM | POA: Diagnosis not present

## 2018-03-18 DIAGNOSIS — B1910 Unspecified viral hepatitis B without hepatic coma: Secondary | ICD-10-CM | POA: Diagnosis not present

## 2018-03-18 DIAGNOSIS — Z944 Liver transplant status: Secondary | ICD-10-CM | POA: Diagnosis not present

## 2018-03-18 DIAGNOSIS — Z6841 Body Mass Index (BMI) 40.0 and over, adult: Secondary | ICD-10-CM | POA: Diagnosis not present

## 2018-03-18 DIAGNOSIS — E878 Other disorders of electrolyte and fluid balance, not elsewhere classified: Secondary | ICD-10-CM | POA: Diagnosis not present

## 2018-03-18 DIAGNOSIS — D62 Acute posthemorrhagic anemia: Secondary | ICD-10-CM | POA: Diagnosis not present

## 2018-03-18 DIAGNOSIS — Z005 Encounter for examination of potential donor of organ and tissue: Secondary | ICD-10-CM | POA: Diagnosis not present

## 2018-03-18 DIAGNOSIS — Z7682 Awaiting organ transplant status: Secondary | ICD-10-CM | POA: Diagnosis not present

## 2018-03-18 DIAGNOSIS — D899 Disorder involving the immune mechanism, unspecified: Secondary | ICD-10-CM | POA: Diagnosis not present

## 2018-03-18 DIAGNOSIS — D689 Coagulation defect, unspecified: Secondary | ICD-10-CM | POA: Diagnosis not present

## 2018-03-18 DIAGNOSIS — R570 Cardiogenic shock: Secondary | ICD-10-CM | POA: Diagnosis not present

## 2018-03-18 DIAGNOSIS — I5032 Chronic diastolic (congestive) heart failure: Secondary | ICD-10-CM | POA: Diagnosis not present

## 2018-03-18 DIAGNOSIS — J9601 Acute respiratory failure with hypoxia: Secondary | ICD-10-CM | POA: Diagnosis not present

## 2018-03-18 DIAGNOSIS — E872 Acidosis: Secondary | ICD-10-CM | POA: Diagnosis not present

## 2018-03-18 DIAGNOSIS — Z949 Transplanted organ and tissue status, unspecified: Secondary | ICD-10-CM | POA: Diagnosis not present

## 2018-03-18 DIAGNOSIS — Z940 Kidney transplant status: Secondary | ICD-10-CM | POA: Diagnosis not present

## 2018-03-18 DIAGNOSIS — I10 Essential (primary) hypertension: Secondary | ICD-10-CM | POA: Diagnosis not present

## 2018-03-18 DIAGNOSIS — R34 Anuria and oliguria: Secondary | ICD-10-CM | POA: Diagnosis not present

## 2018-03-18 DIAGNOSIS — D696 Thrombocytopenia, unspecified: Secondary | ICD-10-CM | POA: Diagnosis not present

## 2018-03-18 DIAGNOSIS — J9602 Acute respiratory failure with hypercapnia: Secondary | ICD-10-CM | POA: Diagnosis not present

## 2018-03-19 DIAGNOSIS — R570 Cardiogenic shock: Secondary | ICD-10-CM | POA: Diagnosis not present

## 2018-03-19 DIAGNOSIS — E872 Acidosis: Secondary | ICD-10-CM | POA: Diagnosis not present

## 2018-03-19 DIAGNOSIS — M6281 Muscle weakness (generalized): Secondary | ICD-10-CM | POA: Diagnosis not present

## 2018-03-19 DIAGNOSIS — Z940 Kidney transplant status: Secondary | ICD-10-CM | POA: Diagnosis not present

## 2018-03-19 DIAGNOSIS — D8989 Other specified disorders involving the immune mechanism, not elsewhere classified: Secondary | ICD-10-CM | POA: Diagnosis not present

## 2018-03-19 DIAGNOSIS — R739 Hyperglycemia, unspecified: Secondary | ICD-10-CM | POA: Diagnosis not present

## 2018-03-19 DIAGNOSIS — D689 Coagulation defect, unspecified: Secondary | ICD-10-CM | POA: Diagnosis not present

## 2018-03-19 DIAGNOSIS — Z792 Long term (current) use of antibiotics: Secondary | ICD-10-CM | POA: Diagnosis not present

## 2018-03-19 DIAGNOSIS — D696 Thrombocytopenia, unspecified: Secondary | ICD-10-CM | POA: Diagnosis not present

## 2018-03-19 DIAGNOSIS — D899 Disorder involving the immune mechanism, unspecified: Secondary | ICD-10-CM | POA: Diagnosis not present

## 2018-03-19 DIAGNOSIS — Z944 Liver transplant status: Secondary | ICD-10-CM | POA: Diagnosis not present

## 2018-03-19 DIAGNOSIS — Z6841 Body Mass Index (BMI) 40.0 and over, adult: Secondary | ICD-10-CM | POA: Diagnosis not present

## 2018-03-19 DIAGNOSIS — I5032 Chronic diastolic (congestive) heart failure: Secondary | ICD-10-CM | POA: Diagnosis not present

## 2018-03-19 DIAGNOSIS — Z005 Encounter for examination of potential donor of organ and tissue: Secondary | ICD-10-CM | POA: Diagnosis not present

## 2018-03-19 DIAGNOSIS — K7460 Unspecified cirrhosis of liver: Secondary | ICD-10-CM | POA: Diagnosis not present

## 2018-03-19 DIAGNOSIS — N184 Chronic kidney disease, stage 4 (severe): Secondary | ICD-10-CM | POA: Diagnosis not present

## 2018-03-19 DIAGNOSIS — Z960 Presence of urogenital implants: Secondary | ICD-10-CM | POA: Diagnosis not present

## 2018-03-19 DIAGNOSIS — Z7682 Awaiting organ transplant status: Secondary | ICD-10-CM | POA: Diagnosis not present

## 2018-03-19 DIAGNOSIS — K9189 Other postprocedural complications and disorders of digestive system: Secondary | ICD-10-CM | POA: Diagnosis not present

## 2018-03-19 DIAGNOSIS — T8619 Other complication of kidney transplant: Secondary | ICD-10-CM | POA: Diagnosis not present

## 2018-03-19 DIAGNOSIS — J9601 Acute respiratory failure with hypoxia: Secondary | ICD-10-CM | POA: Diagnosis not present

## 2018-03-19 DIAGNOSIS — R34 Anuria and oliguria: Secondary | ICD-10-CM | POA: Diagnosis not present

## 2018-03-19 DIAGNOSIS — G8918 Other acute postprocedural pain: Secondary | ICD-10-CM | POA: Diagnosis not present

## 2018-03-19 DIAGNOSIS — E878 Other disorders of electrolyte and fluid balance, not elsewhere classified: Secondary | ICD-10-CM | POA: Diagnosis not present

## 2018-03-20 DIAGNOSIS — R34 Anuria and oliguria: Secondary | ICD-10-CM | POA: Diagnosis not present

## 2018-03-20 DIAGNOSIS — N179 Acute kidney failure, unspecified: Secondary | ICD-10-CM | POA: Diagnosis not present

## 2018-03-20 DIAGNOSIS — Z7682 Awaiting organ transplant status: Secondary | ICD-10-CM | POA: Diagnosis not present

## 2018-03-20 DIAGNOSIS — E878 Other disorders of electrolyte and fluid balance, not elsewhere classified: Secondary | ICD-10-CM | POA: Diagnosis not present

## 2018-03-20 DIAGNOSIS — D899 Disorder involving the immune mechanism, unspecified: Secondary | ICD-10-CM | POA: Diagnosis not present

## 2018-03-20 DIAGNOSIS — G8918 Other acute postprocedural pain: Secondary | ICD-10-CM | POA: Diagnosis not present

## 2018-03-20 DIAGNOSIS — B1910 Unspecified viral hepatitis B without hepatic coma: Secondary | ICD-10-CM | POA: Diagnosis not present

## 2018-03-20 DIAGNOSIS — K9189 Other postprocedural complications and disorders of digestive system: Secondary | ICD-10-CM | POA: Diagnosis not present

## 2018-03-20 DIAGNOSIS — I10 Essential (primary) hypertension: Secondary | ICD-10-CM | POA: Diagnosis not present

## 2018-03-20 DIAGNOSIS — K7460 Unspecified cirrhosis of liver: Secondary | ICD-10-CM | POA: Diagnosis not present

## 2018-03-20 DIAGNOSIS — Z005 Encounter for examination of potential donor of organ and tissue: Secondary | ICD-10-CM | POA: Diagnosis not present

## 2018-03-20 DIAGNOSIS — I5032 Chronic diastolic (congestive) heart failure: Secondary | ICD-10-CM | POA: Diagnosis not present

## 2018-03-20 DIAGNOSIS — N184 Chronic kidney disease, stage 4 (severe): Secondary | ICD-10-CM | POA: Diagnosis not present

## 2018-03-20 DIAGNOSIS — J9601 Acute respiratory failure with hypoxia: Secondary | ICD-10-CM | POA: Diagnosis not present

## 2018-03-20 DIAGNOSIS — D689 Coagulation defect, unspecified: Secondary | ICD-10-CM | POA: Diagnosis not present

## 2018-03-20 DIAGNOSIS — N186 End stage renal disease: Secondary | ICD-10-CM | POA: Diagnosis not present

## 2018-03-20 DIAGNOSIS — K838 Other specified diseases of biliary tract: Secondary | ICD-10-CM | POA: Diagnosis not present

## 2018-03-20 DIAGNOSIS — Z944 Liver transplant status: Secondary | ICD-10-CM | POA: Diagnosis not present

## 2018-03-20 DIAGNOSIS — E872 Acidosis: Secondary | ICD-10-CM | POA: Diagnosis not present

## 2018-03-20 DIAGNOSIS — Z792 Long term (current) use of antibiotics: Secondary | ICD-10-CM | POA: Diagnosis not present

## 2018-03-20 DIAGNOSIS — D62 Acute posthemorrhagic anemia: Secondary | ICD-10-CM | POA: Diagnosis not present

## 2018-03-20 DIAGNOSIS — R570 Cardiogenic shock: Secondary | ICD-10-CM | POA: Diagnosis not present

## 2018-03-20 DIAGNOSIS — D696 Thrombocytopenia, unspecified: Secondary | ICD-10-CM | POA: Diagnosis not present

## 2018-03-20 DIAGNOSIS — Z940 Kidney transplant status: Secondary | ICD-10-CM | POA: Diagnosis not present

## 2018-03-21 DIAGNOSIS — G8918 Other acute postprocedural pain: Secondary | ICD-10-CM | POA: Diagnosis not present

## 2018-03-21 DIAGNOSIS — Z940 Kidney transplant status: Secondary | ICD-10-CM | POA: Diagnosis not present

## 2018-03-21 DIAGNOSIS — D899 Disorder involving the immune mechanism, unspecified: Secondary | ICD-10-CM | POA: Diagnosis not present

## 2018-03-21 DIAGNOSIS — Z7682 Awaiting organ transplant status: Secondary | ICD-10-CM | POA: Diagnosis not present

## 2018-03-21 DIAGNOSIS — Z005 Encounter for examination of potential donor of organ and tissue: Secondary | ICD-10-CM | POA: Diagnosis not present

## 2018-03-21 DIAGNOSIS — Z792 Long term (current) use of antibiotics: Secondary | ICD-10-CM | POA: Diagnosis not present

## 2018-03-21 DIAGNOSIS — Z960 Presence of urogenital implants: Secondary | ICD-10-CM | POA: Diagnosis not present

## 2018-03-21 DIAGNOSIS — K9189 Other postprocedural complications and disorders of digestive system: Secondary | ICD-10-CM | POA: Diagnosis not present

## 2018-03-21 DIAGNOSIS — Z944 Liver transplant status: Secondary | ICD-10-CM | POA: Diagnosis not present

## 2018-03-21 DIAGNOSIS — T8619 Other complication of kidney transplant: Secondary | ICD-10-CM | POA: Diagnosis not present

## 2018-03-21 DIAGNOSIS — R739 Hyperglycemia, unspecified: Secondary | ICD-10-CM | POA: Diagnosis not present

## 2018-03-22 DIAGNOSIS — Z005 Encounter for examination of potential donor of organ and tissue: Secondary | ICD-10-CM | POA: Diagnosis not present

## 2018-03-22 DIAGNOSIS — Z940 Kidney transplant status: Secondary | ICD-10-CM | POA: Diagnosis not present

## 2018-03-22 DIAGNOSIS — Z944 Liver transplant status: Secondary | ICD-10-CM | POA: Diagnosis not present

## 2018-03-22 DIAGNOSIS — Z7682 Awaiting organ transplant status: Secondary | ICD-10-CM | POA: Diagnosis not present

## 2018-03-22 DIAGNOSIS — Z960 Presence of urogenital implants: Secondary | ICD-10-CM | POA: Diagnosis not present

## 2018-03-22 DIAGNOSIS — G8918 Other acute postprocedural pain: Secondary | ICD-10-CM | POA: Diagnosis not present

## 2018-03-22 DIAGNOSIS — D696 Thrombocytopenia, unspecified: Secondary | ICD-10-CM | POA: Diagnosis not present

## 2018-03-22 DIAGNOSIS — T8619 Other complication of kidney transplant: Secondary | ICD-10-CM | POA: Diagnosis not present

## 2018-03-22 DIAGNOSIS — N184 Chronic kidney disease, stage 4 (severe): Secondary | ICD-10-CM | POA: Diagnosis not present

## 2018-03-22 DIAGNOSIS — Z792 Long term (current) use of antibiotics: Secondary | ICD-10-CM | POA: Diagnosis not present

## 2018-03-22 DIAGNOSIS — I5032 Chronic diastolic (congestive) heart failure: Secondary | ICD-10-CM | POA: Diagnosis not present

## 2018-03-22 DIAGNOSIS — D899 Disorder involving the immune mechanism, unspecified: Secondary | ICD-10-CM | POA: Diagnosis not present

## 2018-03-22 DIAGNOSIS — R739 Hyperglycemia, unspecified: Secondary | ICD-10-CM | POA: Diagnosis not present

## 2018-03-22 DIAGNOSIS — D689 Coagulation defect, unspecified: Secondary | ICD-10-CM | POA: Diagnosis not present

## 2018-03-22 DIAGNOSIS — K9189 Other postprocedural complications and disorders of digestive system: Secondary | ICD-10-CM | POA: Diagnosis not present

## 2018-03-23 DIAGNOSIS — K9189 Other postprocedural complications and disorders of digestive system: Secondary | ICD-10-CM | POA: Diagnosis not present

## 2018-03-23 DIAGNOSIS — G8918 Other acute postprocedural pain: Secondary | ICD-10-CM | POA: Diagnosis not present

## 2018-03-23 DIAGNOSIS — Z944 Liver transplant status: Secondary | ICD-10-CM | POA: Diagnosis not present

## 2018-03-23 DIAGNOSIS — D899 Disorder involving the immune mechanism, unspecified: Secondary | ICD-10-CM | POA: Diagnosis not present

## 2018-03-23 DIAGNOSIS — Z7682 Awaiting organ transplant status: Secondary | ICD-10-CM | POA: Diagnosis not present

## 2018-03-23 DIAGNOSIS — Z005 Encounter for examination of potential donor of organ and tissue: Secondary | ICD-10-CM | POA: Diagnosis not present

## 2018-03-23 DIAGNOSIS — Z960 Presence of urogenital implants: Secondary | ICD-10-CM | POA: Diagnosis not present

## 2018-03-23 DIAGNOSIS — T8619 Other complication of kidney transplant: Secondary | ICD-10-CM | POA: Diagnosis not present

## 2018-03-23 DIAGNOSIS — Z792 Long term (current) use of antibiotics: Secondary | ICD-10-CM | POA: Diagnosis not present

## 2018-03-23 DIAGNOSIS — R739 Hyperglycemia, unspecified: Secondary | ICD-10-CM | POA: Diagnosis not present

## 2018-03-23 DIAGNOSIS — Z940 Kidney transplant status: Secondary | ICD-10-CM | POA: Diagnosis not present

## 2018-03-24 DIAGNOSIS — D689 Coagulation defect, unspecified: Secondary | ICD-10-CM | POA: Diagnosis not present

## 2018-03-24 DIAGNOSIS — I5032 Chronic diastolic (congestive) heart failure: Secondary | ICD-10-CM | POA: Diagnosis not present

## 2018-03-24 DIAGNOSIS — D696 Thrombocytopenia, unspecified: Secondary | ICD-10-CM | POA: Diagnosis not present

## 2018-03-24 DIAGNOSIS — Z005 Encounter for examination of potential donor of organ and tissue: Secondary | ICD-10-CM | POA: Diagnosis not present

## 2018-03-24 DIAGNOSIS — R739 Hyperglycemia, unspecified: Secondary | ICD-10-CM | POA: Diagnosis not present

## 2018-03-24 DIAGNOSIS — N184 Chronic kidney disease, stage 4 (severe): Secondary | ICD-10-CM | POA: Diagnosis not present

## 2018-03-24 DIAGNOSIS — I10 Essential (primary) hypertension: Secondary | ICD-10-CM | POA: Diagnosis not present

## 2018-03-24 DIAGNOSIS — T8619 Other complication of kidney transplant: Secondary | ICD-10-CM | POA: Diagnosis not present

## 2018-03-24 DIAGNOSIS — I5030 Unspecified diastolic (congestive) heart failure: Secondary | ICD-10-CM | POA: Diagnosis not present

## 2018-03-24 DIAGNOSIS — T380X5A Adverse effect of glucocorticoids and synthetic analogues, initial encounter: Secondary | ICD-10-CM | POA: Diagnosis not present

## 2018-03-24 DIAGNOSIS — E8770 Fluid overload, unspecified: Secondary | ICD-10-CM | POA: Diagnosis not present

## 2018-03-24 DIAGNOSIS — Z960 Presence of urogenital implants: Secondary | ICD-10-CM | POA: Diagnosis not present

## 2018-03-24 DIAGNOSIS — D899 Disorder involving the immune mechanism, unspecified: Secondary | ICD-10-CM | POA: Diagnosis not present

## 2018-03-24 DIAGNOSIS — Z944 Liver transplant status: Secondary | ICD-10-CM | POA: Diagnosis not present

## 2018-03-24 DIAGNOSIS — Z792 Long term (current) use of antibiotics: Secondary | ICD-10-CM | POA: Diagnosis not present

## 2018-03-24 DIAGNOSIS — Z940 Kidney transplant status: Secondary | ICD-10-CM | POA: Diagnosis not present

## 2018-03-24 DIAGNOSIS — Z7682 Awaiting organ transplant status: Secondary | ICD-10-CM | POA: Diagnosis not present

## 2018-03-24 DIAGNOSIS — R69 Illness, unspecified: Secondary | ICD-10-CM | POA: Diagnosis not present

## 2018-03-25 DIAGNOSIS — T380X5A Adverse effect of glucocorticoids and synthetic analogues, initial encounter: Secondary | ICD-10-CM | POA: Diagnosis not present

## 2018-03-25 DIAGNOSIS — Z940 Kidney transplant status: Secondary | ICD-10-CM | POA: Diagnosis not present

## 2018-03-25 DIAGNOSIS — R739 Hyperglycemia, unspecified: Secondary | ICD-10-CM | POA: Diagnosis not present

## 2018-03-25 DIAGNOSIS — Z7682 Awaiting organ transplant status: Secondary | ICD-10-CM | POA: Diagnosis not present

## 2018-03-25 DIAGNOSIS — E8770 Fluid overload, unspecified: Secondary | ICD-10-CM | POA: Diagnosis not present

## 2018-03-25 DIAGNOSIS — Z944 Liver transplant status: Secondary | ICD-10-CM | POA: Diagnosis not present

## 2018-03-25 DIAGNOSIS — D899 Disorder involving the immune mechanism, unspecified: Secondary | ICD-10-CM | POA: Diagnosis not present

## 2018-03-26 DIAGNOSIS — D899 Disorder involving the immune mechanism, unspecified: Secondary | ICD-10-CM | POA: Diagnosis not present

## 2018-03-26 DIAGNOSIS — Z949 Transplanted organ and tissue status, unspecified: Secondary | ICD-10-CM | POA: Diagnosis not present

## 2018-03-26 DIAGNOSIS — Z940 Kidney transplant status: Secondary | ICD-10-CM | POA: Diagnosis not present

## 2018-03-26 DIAGNOSIS — Z944 Liver transplant status: Secondary | ICD-10-CM | POA: Diagnosis not present

## 2018-03-26 DIAGNOSIS — E8770 Fluid overload, unspecified: Secondary | ICD-10-CM | POA: Diagnosis not present

## 2018-03-26 DIAGNOSIS — Z7682 Awaiting organ transplant status: Secondary | ICD-10-CM | POA: Diagnosis not present

## 2018-03-27 DIAGNOSIS — R69 Illness, unspecified: Secondary | ICD-10-CM | POA: Diagnosis not present

## 2018-03-27 DIAGNOSIS — Z949 Transplanted organ and tissue status, unspecified: Secondary | ICD-10-CM | POA: Diagnosis not present

## 2018-03-27 DIAGNOSIS — E8770 Fluid overload, unspecified: Secondary | ICD-10-CM | POA: Diagnosis not present

## 2018-03-27 DIAGNOSIS — D899 Disorder involving the immune mechanism, unspecified: Secondary | ICD-10-CM | POA: Diagnosis not present

## 2018-03-27 DIAGNOSIS — Z7682 Awaiting organ transplant status: Secondary | ICD-10-CM | POA: Diagnosis not present

## 2018-03-27 DIAGNOSIS — Z944 Liver transplant status: Secondary | ICD-10-CM | POA: Diagnosis not present

## 2018-03-27 DIAGNOSIS — Z940 Kidney transplant status: Secondary | ICD-10-CM | POA: Diagnosis not present

## 2018-03-28 DIAGNOSIS — Z005 Encounter for examination of potential donor of organ and tissue: Secondary | ICD-10-CM | POA: Diagnosis not present

## 2018-03-28 DIAGNOSIS — Z960 Presence of urogenital implants: Secondary | ICD-10-CM | POA: Diagnosis not present

## 2018-03-28 DIAGNOSIS — E8770 Fluid overload, unspecified: Secondary | ICD-10-CM | POA: Diagnosis not present

## 2018-03-28 DIAGNOSIS — B1910 Unspecified viral hepatitis B without hepatic coma: Secondary | ICD-10-CM | POA: Diagnosis not present

## 2018-03-28 DIAGNOSIS — I10 Essential (primary) hypertension: Secondary | ICD-10-CM | POA: Diagnosis not present

## 2018-03-28 DIAGNOSIS — D899 Disorder involving the immune mechanism, unspecified: Secondary | ICD-10-CM | POA: Diagnosis not present

## 2018-03-28 DIAGNOSIS — R739 Hyperglycemia, unspecified: Secondary | ICD-10-CM | POA: Diagnosis not present

## 2018-03-28 DIAGNOSIS — Z940 Kidney transplant status: Secondary | ICD-10-CM | POA: Diagnosis not present

## 2018-03-28 DIAGNOSIS — I5030 Unspecified diastolic (congestive) heart failure: Secondary | ICD-10-CM | POA: Diagnosis not present

## 2018-03-28 DIAGNOSIS — Z792 Long term (current) use of antibiotics: Secondary | ICD-10-CM | POA: Diagnosis not present

## 2018-03-28 DIAGNOSIS — Z944 Liver transplant status: Secondary | ICD-10-CM | POA: Diagnosis not present

## 2018-03-28 DIAGNOSIS — T8619 Other complication of kidney transplant: Secondary | ICD-10-CM | POA: Diagnosis not present

## 2018-03-29 DIAGNOSIS — D899 Disorder involving the immune mechanism, unspecified: Secondary | ICD-10-CM | POA: Diagnosis not present

## 2018-03-29 DIAGNOSIS — Z7682 Awaiting organ transplant status: Secondary | ICD-10-CM | POA: Diagnosis not present

## 2018-03-29 DIAGNOSIS — E8770 Fluid overload, unspecified: Secondary | ICD-10-CM | POA: Diagnosis not present

## 2018-03-29 DIAGNOSIS — Z940 Kidney transplant status: Secondary | ICD-10-CM | POA: Diagnosis not present

## 2018-03-29 DIAGNOSIS — Z944 Liver transplant status: Secondary | ICD-10-CM | POA: Diagnosis not present

## 2018-03-29 DIAGNOSIS — B1910 Unspecified viral hepatitis B without hepatic coma: Secondary | ICD-10-CM | POA: Diagnosis not present

## 2018-03-29 DIAGNOSIS — Z005 Encounter for examination of potential donor of organ and tissue: Secondary | ICD-10-CM | POA: Diagnosis not present

## 2018-03-30 DIAGNOSIS — D899 Disorder involving the immune mechanism, unspecified: Secondary | ICD-10-CM | POA: Diagnosis not present

## 2018-03-30 DIAGNOSIS — M7989 Other specified soft tissue disorders: Secondary | ICD-10-CM | POA: Diagnosis not present

## 2018-03-30 DIAGNOSIS — Z949 Transplanted organ and tissue status, unspecified: Secondary | ICD-10-CM | POA: Diagnosis not present

## 2018-03-31 DIAGNOSIS — D899 Disorder involving the immune mechanism, unspecified: Secondary | ICD-10-CM | POA: Diagnosis not present

## 2018-03-31 DIAGNOSIS — R69 Illness, unspecified: Secondary | ICD-10-CM | POA: Diagnosis not present

## 2018-03-31 DIAGNOSIS — Z944 Liver transplant status: Secondary | ICD-10-CM | POA: Diagnosis not present

## 2018-03-31 DIAGNOSIS — K9189 Other postprocedural complications and disorders of digestive system: Secondary | ICD-10-CM | POA: Diagnosis not present

## 2018-03-31 DIAGNOSIS — Z7682 Awaiting organ transplant status: Secondary | ICD-10-CM | POA: Diagnosis not present

## 2018-03-31 DIAGNOSIS — K838 Other specified diseases of biliary tract: Secondary | ICD-10-CM | POA: Diagnosis not present

## 2018-04-01 DIAGNOSIS — N189 Chronic kidney disease, unspecified: Secondary | ICD-10-CM | POA: Diagnosis not present

## 2018-04-02 DIAGNOSIS — Z960 Presence of urogenital implants: Secondary | ICD-10-CM | POA: Diagnosis not present

## 2018-04-02 DIAGNOSIS — Z944 Liver transplant status: Secondary | ICD-10-CM | POA: Diagnosis not present

## 2018-04-02 DIAGNOSIS — I110 Hypertensive heart disease with heart failure: Secondary | ICD-10-CM | POA: Diagnosis not present

## 2018-04-02 DIAGNOSIS — Z940 Kidney transplant status: Secondary | ICD-10-CM | POA: Diagnosis not present

## 2018-04-02 DIAGNOSIS — B1920 Unspecified viral hepatitis C without hepatic coma: Secondary | ICD-10-CM | POA: Diagnosis not present

## 2018-04-02 DIAGNOSIS — Z48288 Encounter for aftercare following multiple organ transplant: Secondary | ICD-10-CM | POA: Diagnosis not present

## 2018-04-02 DIAGNOSIS — R600 Localized edema: Secondary | ICD-10-CM | POA: Diagnosis not present

## 2018-04-02 DIAGNOSIS — R7989 Other specified abnormal findings of blood chemistry: Secondary | ICD-10-CM | POA: Diagnosis not present

## 2018-04-02 DIAGNOSIS — I5030 Unspecified diastolic (congestive) heart failure: Secondary | ICD-10-CM | POA: Diagnosis not present

## 2018-04-06 DIAGNOSIS — Z87891 Personal history of nicotine dependence: Secondary | ICD-10-CM | POA: Diagnosis not present

## 2018-04-06 DIAGNOSIS — T8189XA Other complications of procedures, not elsewhere classified, initial encounter: Secondary | ICD-10-CM | POA: Diagnosis not present

## 2018-04-06 DIAGNOSIS — R51 Headache: Secondary | ICD-10-CM | POA: Diagnosis not present

## 2018-04-06 DIAGNOSIS — R358 Other polyuria: Secondary | ICD-10-CM | POA: Diagnosis not present

## 2018-04-06 DIAGNOSIS — R509 Fever, unspecified: Secondary | ICD-10-CM | POA: Diagnosis not present

## 2018-04-06 DIAGNOSIS — R0602 Shortness of breath: Secondary | ICD-10-CM | POA: Diagnosis not present

## 2018-04-06 DIAGNOSIS — Z4682 Encounter for fitting and adjustment of non-vascular catheter: Secondary | ICD-10-CM | POA: Diagnosis not present

## 2018-04-06 DIAGNOSIS — R197 Diarrhea, unspecified: Secondary | ICD-10-CM | POA: Diagnosis not present

## 2018-04-06 DIAGNOSIS — M79602 Pain in left arm: Secondary | ICD-10-CM | POA: Diagnosis not present

## 2018-04-06 DIAGNOSIS — Z944 Liver transplant status: Secondary | ICD-10-CM | POA: Diagnosis not present

## 2018-04-06 DIAGNOSIS — Z940 Kidney transplant status: Secondary | ICD-10-CM | POA: Diagnosis not present

## 2018-04-06 DIAGNOSIS — Z79899 Other long term (current) drug therapy: Secondary | ICD-10-CM | POA: Diagnosis not present

## 2018-04-06 DIAGNOSIS — R5383 Other fatigue: Secondary | ICD-10-CM | POA: Diagnosis not present

## 2018-04-06 DIAGNOSIS — M7989 Other specified soft tissue disorders: Secondary | ICD-10-CM | POA: Diagnosis not present

## 2018-04-06 DIAGNOSIS — J9811 Atelectasis: Secondary | ICD-10-CM | POA: Diagnosis not present

## 2018-04-06 DIAGNOSIS — R140 Abdominal distension (gaseous): Secondary | ICD-10-CM | POA: Diagnosis not present

## 2018-04-06 DIAGNOSIS — T8610 Unspecified complication of kidney transplant: Secondary | ICD-10-CM | POA: Diagnosis not present

## 2018-04-06 DIAGNOSIS — Y838 Other surgical procedures as the cause of abnormal reaction of the patient, or of later complication, without mention of misadventure at the time of the procedure: Secondary | ICD-10-CM | POA: Diagnosis not present

## 2018-04-06 DIAGNOSIS — Z5181 Encounter for therapeutic drug level monitoring: Secondary | ICD-10-CM | POA: Diagnosis not present

## 2018-04-07 DIAGNOSIS — D899 Disorder involving the immune mechanism, unspecified: Secondary | ICD-10-CM | POA: Diagnosis not present

## 2018-04-07 DIAGNOSIS — Z944 Liver transplant status: Secondary | ICD-10-CM | POA: Diagnosis not present

## 2018-04-07 DIAGNOSIS — Z940 Kidney transplant status: Secondary | ICD-10-CM | POA: Diagnosis not present

## 2018-04-08 DIAGNOSIS — I5032 Chronic diastolic (congestive) heart failure: Secondary | ICD-10-CM | POA: Diagnosis not present

## 2018-04-08 DIAGNOSIS — T8579XS Infection and inflammatory reaction due to other internal prosthetic devices, implants and grafts, sequela: Secondary | ICD-10-CM | POA: Diagnosis not present

## 2018-04-08 DIAGNOSIS — B965 Pseudomonas (aeruginosa) (mallei) (pseudomallei) as the cause of diseases classified elsewhere: Secondary | ICD-10-CM | POA: Diagnosis not present

## 2018-04-08 DIAGNOSIS — A498 Other bacterial infections of unspecified site: Secondary | ICD-10-CM | POA: Diagnosis not present

## 2018-04-08 DIAGNOSIS — Z792 Long term (current) use of antibiotics: Secondary | ICD-10-CM | POA: Diagnosis not present

## 2018-04-08 DIAGNOSIS — B961 Klebsiella pneumoniae [K. pneumoniae] as the cause of diseases classified elsewhere: Secondary | ICD-10-CM | POA: Diagnosis not present

## 2018-04-08 DIAGNOSIS — Z940 Kidney transplant status: Secondary | ICD-10-CM | POA: Diagnosis not present

## 2018-04-08 DIAGNOSIS — T8619 Other complication of kidney transplant: Secondary | ICD-10-CM | POA: Diagnosis not present

## 2018-04-08 DIAGNOSIS — Z005 Encounter for examination of potential donor of organ and tissue: Secondary | ICD-10-CM | POA: Diagnosis not present

## 2018-04-08 DIAGNOSIS — N179 Acute kidney failure, unspecified: Secondary | ICD-10-CM | POA: Diagnosis not present

## 2018-04-08 DIAGNOSIS — D899 Disorder involving the immune mechanism, unspecified: Secondary | ICD-10-CM | POA: Diagnosis not present

## 2018-04-08 DIAGNOSIS — Y813 Surgical instruments, materials and general- and plastic-surgery devices (including sutures) associated with adverse incidents: Secondary | ICD-10-CM | POA: Diagnosis not present

## 2018-04-08 DIAGNOSIS — Z944 Liver transplant status: Secondary | ICD-10-CM | POA: Diagnosis not present

## 2018-04-08 DIAGNOSIS — Z960 Presence of urogenital implants: Secondary | ICD-10-CM | POA: Diagnosis not present

## 2018-04-08 DIAGNOSIS — B1910 Unspecified viral hepatitis B without hepatic coma: Secondary | ICD-10-CM | POA: Diagnosis not present

## 2018-04-08 DIAGNOSIS — Z205 Contact with and (suspected) exposure to viral hepatitis: Secondary | ICD-10-CM | POA: Diagnosis not present

## 2018-04-08 DIAGNOSIS — N390 Urinary tract infection, site not specified: Secondary | ICD-10-CM | POA: Diagnosis not present

## 2018-04-09 DIAGNOSIS — T8619 Other complication of kidney transplant: Secondary | ICD-10-CM | POA: Diagnosis not present

## 2018-04-09 DIAGNOSIS — Z940 Kidney transplant status: Secondary | ICD-10-CM | POA: Diagnosis not present

## 2018-04-09 DIAGNOSIS — D899 Disorder involving the immune mechanism, unspecified: Secondary | ICD-10-CM | POA: Diagnosis not present

## 2018-04-09 DIAGNOSIS — Z944 Liver transplant status: Secondary | ICD-10-CM | POA: Diagnosis not present

## 2018-04-10 DIAGNOSIS — Z960 Presence of urogenital implants: Secondary | ICD-10-CM | POA: Diagnosis not present

## 2018-04-10 DIAGNOSIS — Z005 Encounter for examination of potential donor of organ and tissue: Secondary | ICD-10-CM | POA: Diagnosis not present

## 2018-04-10 DIAGNOSIS — Z792 Long term (current) use of antibiotics: Secondary | ICD-10-CM | POA: Diagnosis not present

## 2018-04-10 DIAGNOSIS — B965 Pseudomonas (aeruginosa) (mallei) (pseudomallei) as the cause of diseases classified elsewhere: Secondary | ICD-10-CM | POA: Diagnosis not present

## 2018-04-10 DIAGNOSIS — B1910 Unspecified viral hepatitis B without hepatic coma: Secondary | ICD-10-CM | POA: Diagnosis not present

## 2018-04-10 DIAGNOSIS — B961 Klebsiella pneumoniae [K. pneumoniae] as the cause of diseases classified elsewhere: Secondary | ICD-10-CM | POA: Diagnosis not present

## 2018-04-10 DIAGNOSIS — A498 Other bacterial infections of unspecified site: Secondary | ICD-10-CM | POA: Diagnosis not present

## 2018-04-10 DIAGNOSIS — Z940 Kidney transplant status: Secondary | ICD-10-CM | POA: Diagnosis not present

## 2018-04-10 DIAGNOSIS — T8619 Other complication of kidney transplant: Secondary | ICD-10-CM | POA: Diagnosis not present

## 2018-04-10 DIAGNOSIS — D899 Disorder involving the immune mechanism, unspecified: Secondary | ICD-10-CM | POA: Diagnosis not present

## 2018-04-10 DIAGNOSIS — N390 Urinary tract infection, site not specified: Secondary | ICD-10-CM | POA: Diagnosis not present

## 2018-04-10 DIAGNOSIS — Z944 Liver transplant status: Secondary | ICD-10-CM | POA: Diagnosis not present

## 2018-04-11 DIAGNOSIS — N390 Urinary tract infection, site not specified: Secondary | ICD-10-CM | POA: Diagnosis not present

## 2018-04-11 DIAGNOSIS — Z792 Long term (current) use of antibiotics: Secondary | ICD-10-CM | POA: Diagnosis not present

## 2018-04-11 DIAGNOSIS — B961 Klebsiella pneumoniae [K. pneumoniae] as the cause of diseases classified elsewhere: Secondary | ICD-10-CM | POA: Diagnosis not present

## 2018-04-11 DIAGNOSIS — Z940 Kidney transplant status: Secondary | ICD-10-CM | POA: Diagnosis not present

## 2018-04-11 DIAGNOSIS — B1910 Unspecified viral hepatitis B without hepatic coma: Secondary | ICD-10-CM | POA: Diagnosis not present

## 2018-04-11 DIAGNOSIS — Z005 Encounter for examination of potential donor of organ and tissue: Secondary | ICD-10-CM | POA: Diagnosis not present

## 2018-04-11 DIAGNOSIS — A498 Other bacterial infections of unspecified site: Secondary | ICD-10-CM | POA: Diagnosis not present

## 2018-04-11 DIAGNOSIS — D899 Disorder involving the immune mechanism, unspecified: Secondary | ICD-10-CM | POA: Diagnosis not present

## 2018-04-11 DIAGNOSIS — Z944 Liver transplant status: Secondary | ICD-10-CM | POA: Diagnosis not present

## 2018-04-12 DIAGNOSIS — N390 Urinary tract infection, site not specified: Secondary | ICD-10-CM | POA: Diagnosis not present

## 2018-04-13 DIAGNOSIS — N390 Urinary tract infection, site not specified: Secondary | ICD-10-CM | POA: Diagnosis not present

## 2018-04-14 DIAGNOSIS — N390 Urinary tract infection, site not specified: Secondary | ICD-10-CM | POA: Diagnosis not present

## 2018-04-14 DIAGNOSIS — Z944 Liver transplant status: Secondary | ICD-10-CM | POA: Diagnosis not present

## 2018-04-14 DIAGNOSIS — D899 Disorder involving the immune mechanism, unspecified: Secondary | ICD-10-CM | POA: Diagnosis not present

## 2018-04-14 DIAGNOSIS — R5381 Other malaise: Secondary | ICD-10-CM | POA: Diagnosis not present

## 2018-04-14 DIAGNOSIS — Z940 Kidney transplant status: Secondary | ICD-10-CM | POA: Diagnosis not present

## 2018-04-14 DIAGNOSIS — A498 Other bacterial infections of unspecified site: Secondary | ICD-10-CM | POA: Diagnosis not present

## 2018-04-14 DIAGNOSIS — B961 Klebsiella pneumoniae [K. pneumoniae] as the cause of diseases classified elsewhere: Secondary | ICD-10-CM | POA: Diagnosis not present

## 2018-04-15 DIAGNOSIS — N390 Urinary tract infection, site not specified: Secondary | ICD-10-CM | POA: Diagnosis not present

## 2018-04-16 DIAGNOSIS — N390 Urinary tract infection, site not specified: Secondary | ICD-10-CM | POA: Diagnosis not present

## 2018-04-17 DIAGNOSIS — N390 Urinary tract infection, site not specified: Secondary | ICD-10-CM | POA: Diagnosis not present

## 2018-04-18 DIAGNOSIS — D899 Disorder involving the immune mechanism, unspecified: Secondary | ICD-10-CM | POA: Diagnosis not present

## 2018-04-18 DIAGNOSIS — N390 Urinary tract infection, site not specified: Secondary | ICD-10-CM | POA: Diagnosis not present

## 2018-04-18 DIAGNOSIS — Z940 Kidney transplant status: Secondary | ICD-10-CM | POA: Diagnosis not present

## 2018-04-18 DIAGNOSIS — Z944 Liver transplant status: Secondary | ICD-10-CM | POA: Diagnosis not present

## 2018-04-19 DIAGNOSIS — N390 Urinary tract infection, site not specified: Secondary | ICD-10-CM | POA: Diagnosis not present

## 2018-04-20 DIAGNOSIS — N390 Urinary tract infection, site not specified: Secondary | ICD-10-CM | POA: Diagnosis not present

## 2018-04-21 DIAGNOSIS — N390 Urinary tract infection, site not specified: Secondary | ICD-10-CM | POA: Diagnosis not present

## 2018-04-22 DIAGNOSIS — N390 Urinary tract infection, site not specified: Secondary | ICD-10-CM | POA: Diagnosis not present

## 2018-04-22 DIAGNOSIS — B961 Klebsiella pneumoniae [K. pneumoniae] as the cause of diseases classified elsewhere: Secondary | ICD-10-CM | POA: Diagnosis not present

## 2018-04-22 DIAGNOSIS — R739 Hyperglycemia, unspecified: Secondary | ICD-10-CM | POA: Diagnosis not present

## 2018-04-22 DIAGNOSIS — D899 Disorder involving the immune mechanism, unspecified: Secondary | ICD-10-CM | POA: Diagnosis not present

## 2018-04-22 DIAGNOSIS — R69 Illness, unspecified: Secondary | ICD-10-CM | POA: Diagnosis not present

## 2018-04-22 DIAGNOSIS — T8643 Liver transplant infection: Secondary | ICD-10-CM | POA: Diagnosis not present

## 2018-04-22 DIAGNOSIS — B1920 Unspecified viral hepatitis C without hepatic coma: Secondary | ICD-10-CM | POA: Diagnosis not present

## 2018-04-22 DIAGNOSIS — Z940 Kidney transplant status: Secondary | ICD-10-CM | POA: Diagnosis not present

## 2018-04-22 DIAGNOSIS — T8619 Other complication of kidney transplant: Secondary | ICD-10-CM | POA: Diagnosis not present

## 2018-04-22 DIAGNOSIS — Y830 Surgical operation with transplant of whole organ as the cause of abnormal reaction of the patient, or of later complication, without mention of misadventure at the time of the procedure: Secondary | ICD-10-CM | POA: Diagnosis not present

## 2018-04-22 DIAGNOSIS — R100 Acute abdomen: Secondary | ICD-10-CM | POA: Diagnosis not present

## 2018-04-22 DIAGNOSIS — Z944 Liver transplant status: Secondary | ICD-10-CM | POA: Diagnosis not present

## 2018-04-22 DIAGNOSIS — T380X5A Adverse effect of glucocorticoids and synthetic analogues, initial encounter: Secondary | ICD-10-CM | POA: Diagnosis not present

## 2018-04-23 DIAGNOSIS — B182 Chronic viral hepatitis C: Secondary | ICD-10-CM | POA: Diagnosis not present

## 2018-04-23 DIAGNOSIS — T8149XD Infection following a procedure, other surgical site, subsequent encounter: Secondary | ICD-10-CM | POA: Diagnosis not present

## 2018-04-23 DIAGNOSIS — Z944 Liver transplant status: Secondary | ICD-10-CM | POA: Diagnosis not present

## 2018-04-23 DIAGNOSIS — B965 Pseudomonas (aeruginosa) (mallei) (pseudomallei) as the cause of diseases classified elsewhere: Secondary | ICD-10-CM | POA: Diagnosis not present

## 2018-04-23 DIAGNOSIS — B349 Viral infection, unspecified: Secondary | ICD-10-CM | POA: Diagnosis not present

## 2018-04-23 DIAGNOSIS — Y830 Surgical operation with transplant of whole organ as the cause of abnormal reaction of the patient, or of later complication, without mention of misadventure at the time of the procedure: Secondary | ICD-10-CM | POA: Diagnosis not present

## 2018-04-23 DIAGNOSIS — B961 Klebsiella pneumoniae [K. pneumoniae] as the cause of diseases classified elsewhere: Secondary | ICD-10-CM | POA: Diagnosis not present

## 2018-04-23 DIAGNOSIS — Z792 Long term (current) use of antibiotics: Secondary | ICD-10-CM | POA: Diagnosis not present

## 2018-04-23 DIAGNOSIS — Z452 Encounter for adjustment and management of vascular access device: Secondary | ICD-10-CM | POA: Diagnosis not present

## 2018-04-23 DIAGNOSIS — T8613 Kidney transplant infection: Secondary | ICD-10-CM | POA: Diagnosis not present

## 2018-04-23 DIAGNOSIS — N390 Urinary tract infection, site not specified: Secondary | ICD-10-CM | POA: Diagnosis not present

## 2018-04-24 DIAGNOSIS — N390 Urinary tract infection, site not specified: Secondary | ICD-10-CM | POA: Diagnosis not present

## 2018-04-25 DIAGNOSIS — N390 Urinary tract infection, site not specified: Secondary | ICD-10-CM | POA: Diagnosis not present

## 2018-04-26 DIAGNOSIS — N390 Urinary tract infection, site not specified: Secondary | ICD-10-CM | POA: Diagnosis not present

## 2018-04-28 DIAGNOSIS — Z944 Liver transplant status: Secondary | ICD-10-CM | POA: Diagnosis not present

## 2018-04-28 DIAGNOSIS — D899 Disorder involving the immune mechanism, unspecified: Secondary | ICD-10-CM | POA: Diagnosis not present

## 2018-05-02 DIAGNOSIS — R1900 Intra-abdominal and pelvic swelling, mass and lump, unspecified site: Secondary | ICD-10-CM | POA: Diagnosis not present

## 2018-05-02 DIAGNOSIS — Z79899 Other long term (current) drug therapy: Secondary | ICD-10-CM | POA: Diagnosis not present

## 2018-05-02 DIAGNOSIS — K439 Ventral hernia without obstruction or gangrene: Secondary | ICD-10-CM | POA: Diagnosis not present

## 2018-05-02 DIAGNOSIS — Z944 Liver transplant status: Secondary | ICD-10-CM | POA: Diagnosis not present

## 2018-05-02 DIAGNOSIS — Z940 Kidney transplant status: Secondary | ICD-10-CM | POA: Diagnosis not present

## 2018-05-02 DIAGNOSIS — D899 Disorder involving the immune mechanism, unspecified: Secondary | ICD-10-CM | POA: Diagnosis not present

## 2018-05-02 DIAGNOSIS — Z949 Transplanted organ and tissue status, unspecified: Secondary | ICD-10-CM | POA: Diagnosis not present

## 2018-05-02 DIAGNOSIS — Z792 Long term (current) use of antibiotics: Secondary | ICD-10-CM | POA: Diagnosis not present

## 2018-05-02 DIAGNOSIS — Z4823 Encounter for aftercare following liver transplant: Secondary | ICD-10-CM | POA: Diagnosis not present

## 2018-05-03 DIAGNOSIS — A498 Other bacterial infections of unspecified site: Secondary | ICD-10-CM | POA: Diagnosis not present

## 2018-05-03 DIAGNOSIS — Z944 Liver transplant status: Secondary | ICD-10-CM | POA: Diagnosis not present

## 2018-05-08 DIAGNOSIS — Z792 Long term (current) use of antibiotics: Secondary | ICD-10-CM | POA: Diagnosis not present

## 2018-05-08 DIAGNOSIS — Z942 Lung transplant status: Secondary | ICD-10-CM | POA: Diagnosis not present

## 2018-05-08 DIAGNOSIS — Z944 Liver transplant status: Secondary | ICD-10-CM | POA: Diagnosis not present

## 2018-05-13 DIAGNOSIS — D899 Disorder involving the immune mechanism, unspecified: Secondary | ICD-10-CM | POA: Diagnosis not present

## 2018-05-13 DIAGNOSIS — K219 Gastro-esophageal reflux disease without esophagitis: Secondary | ICD-10-CM | POA: Diagnosis not present

## 2018-05-13 DIAGNOSIS — Z466 Encounter for fitting and adjustment of urinary device: Secondary | ICD-10-CM | POA: Diagnosis not present

## 2018-05-13 DIAGNOSIS — K432 Incisional hernia without obstruction or gangrene: Secondary | ICD-10-CM | POA: Diagnosis not present

## 2018-05-13 DIAGNOSIS — R69 Illness, unspecified: Secondary | ICD-10-CM | POA: Diagnosis not present

## 2018-05-13 DIAGNOSIS — D638 Anemia in other chronic diseases classified elsewhere: Secondary | ICD-10-CM | POA: Diagnosis not present

## 2018-05-13 DIAGNOSIS — I130 Hypertensive heart and chronic kidney disease with heart failure and stage 1 through stage 4 chronic kidney disease, or unspecified chronic kidney disease: Secondary | ICD-10-CM | POA: Diagnosis not present

## 2018-05-13 DIAGNOSIS — K7581 Nonalcoholic steatohepatitis (NASH): Secondary | ICD-10-CM | POA: Diagnosis not present

## 2018-05-13 DIAGNOSIS — Z944 Liver transplant status: Secondary | ICD-10-CM | POA: Diagnosis not present

## 2018-05-13 DIAGNOSIS — I5032 Chronic diastolic (congestive) heart failure: Secondary | ICD-10-CM | POA: Diagnosis not present

## 2018-05-13 DIAGNOSIS — Z1159 Encounter for screening for other viral diseases: Secondary | ICD-10-CM | POA: Diagnosis not present

## 2018-05-13 DIAGNOSIS — K7290 Hepatic failure, unspecified without coma: Secondary | ICD-10-CM | POA: Diagnosis not present

## 2018-05-13 DIAGNOSIS — Z940 Kidney transplant status: Secondary | ICD-10-CM | POA: Diagnosis not present

## 2018-05-13 DIAGNOSIS — G4733 Obstructive sleep apnea (adult) (pediatric): Secondary | ICD-10-CM | POA: Diagnosis not present

## 2018-05-13 DIAGNOSIS — B1920 Unspecified viral hepatitis C without hepatic coma: Secondary | ICD-10-CM | POA: Diagnosis not present

## 2018-05-13 DIAGNOSIS — N189 Chronic kidney disease, unspecified: Secondary | ICD-10-CM | POA: Diagnosis not present

## 2018-05-20 DIAGNOSIS — R69 Illness, unspecified: Secondary | ICD-10-CM | POA: Diagnosis not present

## 2018-05-21 DIAGNOSIS — D899 Disorder involving the immune mechanism, unspecified: Secondary | ICD-10-CM | POA: Diagnosis not present

## 2018-05-21 DIAGNOSIS — Z944 Liver transplant status: Secondary | ICD-10-CM | POA: Diagnosis not present

## 2018-05-28 DIAGNOSIS — Z944 Liver transplant status: Secondary | ICD-10-CM | POA: Diagnosis not present

## 2018-05-28 DIAGNOSIS — Z942 Lung transplant status: Secondary | ICD-10-CM | POA: Diagnosis not present

## 2018-05-28 DIAGNOSIS — Z792 Long term (current) use of antibiotics: Secondary | ICD-10-CM | POA: Diagnosis not present

## 2018-06-03 DIAGNOSIS — Z942 Lung transplant status: Secondary | ICD-10-CM | POA: Diagnosis not present

## 2018-06-03 DIAGNOSIS — Z792 Long term (current) use of antibiotics: Secondary | ICD-10-CM | POA: Diagnosis not present

## 2018-06-03 DIAGNOSIS — Z944 Liver transplant status: Secondary | ICD-10-CM | POA: Diagnosis not present

## 2018-06-10 DIAGNOSIS — Z942 Lung transplant status: Secondary | ICD-10-CM | POA: Diagnosis not present

## 2018-06-10 DIAGNOSIS — Z944 Liver transplant status: Secondary | ICD-10-CM | POA: Diagnosis not present

## 2018-06-10 DIAGNOSIS — Z792 Long term (current) use of antibiotics: Secondary | ICD-10-CM | POA: Diagnosis not present

## 2018-06-17 DIAGNOSIS — Z942 Lung transplant status: Secondary | ICD-10-CM | POA: Diagnosis not present

## 2018-06-17 DIAGNOSIS — Z792 Long term (current) use of antibiotics: Secondary | ICD-10-CM | POA: Diagnosis not present

## 2018-06-17 DIAGNOSIS — Z944 Liver transplant status: Secondary | ICD-10-CM | POA: Diagnosis not present

## 2018-06-25 DIAGNOSIS — Z48288 Encounter for aftercare following multiple organ transplant: Secondary | ICD-10-CM | POA: Diagnosis not present

## 2018-06-25 DIAGNOSIS — D899 Disorder involving the immune mechanism, unspecified: Secondary | ICD-10-CM | POA: Diagnosis not present

## 2018-06-25 DIAGNOSIS — B1920 Unspecified viral hepatitis C without hepatic coma: Secondary | ICD-10-CM | POA: Diagnosis not present

## 2018-06-25 DIAGNOSIS — T8643 Liver transplant infection: Secondary | ICD-10-CM | POA: Diagnosis not present

## 2018-06-25 DIAGNOSIS — Z87440 Personal history of urinary (tract) infections: Secondary | ICD-10-CM | POA: Diagnosis not present

## 2018-06-25 DIAGNOSIS — Z949 Transplanted organ and tissue status, unspecified: Secondary | ICD-10-CM | POA: Diagnosis not present

## 2018-06-25 DIAGNOSIS — Z944 Liver transplant status: Secondary | ICD-10-CM | POA: Diagnosis not present

## 2018-06-25 DIAGNOSIS — Z940 Kidney transplant status: Secondary | ICD-10-CM | POA: Diagnosis not present

## 2018-07-02 DIAGNOSIS — D899 Disorder involving the immune mechanism, unspecified: Secondary | ICD-10-CM | POA: Diagnosis not present

## 2018-07-02 DIAGNOSIS — Z944 Liver transplant status: Secondary | ICD-10-CM | POA: Diagnosis not present

## 2018-07-03 ENCOUNTER — Encounter: Payer: Self-pay | Admitting: Gastroenterology

## 2018-07-09 DIAGNOSIS — Z792 Long term (current) use of antibiotics: Secondary | ICD-10-CM | POA: Diagnosis not present

## 2018-07-09 DIAGNOSIS — Z944 Liver transplant status: Secondary | ICD-10-CM | POA: Diagnosis not present

## 2018-07-09 DIAGNOSIS — Z942 Lung transplant status: Secondary | ICD-10-CM | POA: Diagnosis not present

## 2018-07-14 DIAGNOSIS — Z944 Liver transplant status: Secondary | ICD-10-CM | POA: Diagnosis not present

## 2018-07-14 DIAGNOSIS — D899 Disorder involving the immune mechanism, unspecified: Secondary | ICD-10-CM | POA: Diagnosis not present

## 2018-07-17 DIAGNOSIS — Z940 Kidney transplant status: Secondary | ICD-10-CM | POA: Diagnosis not present

## 2018-07-17 DIAGNOSIS — D899 Disorder involving the immune mechanism, unspecified: Secondary | ICD-10-CM | POA: Diagnosis not present

## 2018-07-17 DIAGNOSIS — Z944 Liver transplant status: Secondary | ICD-10-CM | POA: Diagnosis not present

## 2018-07-23 DIAGNOSIS — Z944 Liver transplant status: Secondary | ICD-10-CM | POA: Diagnosis not present

## 2018-07-23 DIAGNOSIS — N390 Urinary tract infection, site not specified: Secondary | ICD-10-CM | POA: Diagnosis not present

## 2018-07-23 DIAGNOSIS — Z5181 Encounter for therapeutic drug level monitoring: Secondary | ICD-10-CM | POA: Diagnosis not present

## 2018-08-06 DIAGNOSIS — D899 Disorder involving the immune mechanism, unspecified: Secondary | ICD-10-CM | POA: Diagnosis not present

## 2018-08-06 DIAGNOSIS — Z940 Kidney transplant status: Secondary | ICD-10-CM | POA: Diagnosis not present

## 2018-08-06 DIAGNOSIS — Z944 Liver transplant status: Secondary | ICD-10-CM | POA: Diagnosis not present

## 2018-08-20 DIAGNOSIS — D899 Disorder involving the immune mechanism, unspecified: Secondary | ICD-10-CM | POA: Diagnosis not present

## 2018-08-20 DIAGNOSIS — Z940 Kidney transplant status: Secondary | ICD-10-CM | POA: Diagnosis not present

## 2018-08-20 DIAGNOSIS — B1920 Unspecified viral hepatitis C without hepatic coma: Secondary | ICD-10-CM | POA: Diagnosis not present

## 2018-08-20 DIAGNOSIS — Z944 Liver transplant status: Secondary | ICD-10-CM | POA: Diagnosis not present

## 2018-08-20 DIAGNOSIS — T8643 Liver transplant infection: Secondary | ICD-10-CM | POA: Diagnosis not present

## 2018-08-27 DIAGNOSIS — Z792 Long term (current) use of antibiotics: Secondary | ICD-10-CM | POA: Diagnosis not present

## 2018-08-27 DIAGNOSIS — Z944 Liver transplant status: Secondary | ICD-10-CM | POA: Diagnosis not present

## 2018-08-27 DIAGNOSIS — D899 Disorder involving the immune mechanism, unspecified: Secondary | ICD-10-CM | POA: Diagnosis not present

## 2018-09-03 DIAGNOSIS — D899 Disorder involving the immune mechanism, unspecified: Secondary | ICD-10-CM | POA: Diagnosis not present

## 2018-09-03 DIAGNOSIS — Z944 Liver transplant status: Secondary | ICD-10-CM | POA: Diagnosis not present

## 2018-09-03 DIAGNOSIS — Z940 Kidney transplant status: Secondary | ICD-10-CM | POA: Diagnosis not present

## 2018-09-11 DIAGNOSIS — Z944 Liver transplant status: Secondary | ICD-10-CM | POA: Diagnosis not present

## 2018-09-11 DIAGNOSIS — D899 Disorder involving the immune mechanism, unspecified: Secondary | ICD-10-CM | POA: Diagnosis not present

## 2018-09-11 DIAGNOSIS — Z940 Kidney transplant status: Secondary | ICD-10-CM | POA: Diagnosis not present

## 2018-10-01 DIAGNOSIS — Z944 Liver transplant status: Secondary | ICD-10-CM | POA: Diagnosis not present

## 2018-10-01 DIAGNOSIS — D899 Disorder involving the immune mechanism, unspecified: Secondary | ICD-10-CM | POA: Diagnosis not present

## 2018-10-01 DIAGNOSIS — Z940 Kidney transplant status: Secondary | ICD-10-CM | POA: Diagnosis not present

## 2018-10-06 DIAGNOSIS — Z944 Liver transplant status: Secondary | ICD-10-CM | POA: Diagnosis not present

## 2018-10-06 DIAGNOSIS — R69 Illness, unspecified: Secondary | ICD-10-CM | POA: Diagnosis not present

## 2018-10-06 DIAGNOSIS — F4321 Adjustment disorder with depressed mood: Secondary | ICD-10-CM | POA: Diagnosis not present

## 2018-10-08 DIAGNOSIS — R69 Illness, unspecified: Secondary | ICD-10-CM | POA: Diagnosis not present

## 2018-10-16 DIAGNOSIS — Z940 Kidney transplant status: Secondary | ICD-10-CM | POA: Diagnosis not present

## 2018-10-16 DIAGNOSIS — Z944 Liver transplant status: Secondary | ICD-10-CM | POA: Diagnosis not present

## 2018-10-16 DIAGNOSIS — D899 Disorder involving the immune mechanism, unspecified: Secondary | ICD-10-CM | POA: Diagnosis not present

## 2018-10-22 DIAGNOSIS — Z944 Liver transplant status: Secondary | ICD-10-CM | POA: Diagnosis not present

## 2018-10-22 DIAGNOSIS — D899 Disorder involving the immune mechanism, unspecified: Secondary | ICD-10-CM | POA: Diagnosis not present

## 2018-10-22 DIAGNOSIS — Z940 Kidney transplant status: Secondary | ICD-10-CM | POA: Diagnosis not present

## 2018-10-29 DIAGNOSIS — R635 Abnormal weight gain: Secondary | ICD-10-CM | POA: Diagnosis not present

## 2018-10-29 DIAGNOSIS — D72819 Decreased white blood cell count, unspecified: Secondary | ICD-10-CM | POA: Diagnosis not present

## 2018-10-29 DIAGNOSIS — Z79899 Other long term (current) drug therapy: Secondary | ICD-10-CM | POA: Diagnosis not present

## 2018-10-29 DIAGNOSIS — Z23 Encounter for immunization: Secondary | ICD-10-CM | POA: Diagnosis not present

## 2018-10-29 DIAGNOSIS — Z48288 Encounter for aftercare following multiple organ transplant: Secondary | ICD-10-CM | POA: Diagnosis not present

## 2018-10-29 DIAGNOSIS — K432 Incisional hernia without obstruction or gangrene: Secondary | ICD-10-CM | POA: Diagnosis not present

## 2018-10-29 DIAGNOSIS — Z792 Long term (current) use of antibiotics: Secondary | ICD-10-CM | POA: Diagnosis not present

## 2018-10-29 DIAGNOSIS — Z940 Kidney transplant status: Secondary | ICD-10-CM | POA: Diagnosis not present

## 2018-10-29 DIAGNOSIS — B1920 Unspecified viral hepatitis C without hepatic coma: Secondary | ICD-10-CM | POA: Diagnosis not present

## 2018-10-29 DIAGNOSIS — D899 Disorder involving the immune mechanism, unspecified: Secondary | ICD-10-CM | POA: Diagnosis not present

## 2018-10-29 DIAGNOSIS — Z944 Liver transplant status: Secondary | ICD-10-CM | POA: Diagnosis not present

## 2018-11-05 DIAGNOSIS — Z944 Liver transplant status: Secondary | ICD-10-CM | POA: Diagnosis not present

## 2018-11-05 DIAGNOSIS — N184 Chronic kidney disease, stage 4 (severe): Secondary | ICD-10-CM | POA: Diagnosis not present

## 2018-11-05 DIAGNOSIS — Z940 Kidney transplant status: Secondary | ICD-10-CM | POA: Diagnosis not present

## 2018-11-05 DIAGNOSIS — E1122 Type 2 diabetes mellitus with diabetic chronic kidney disease: Secondary | ICD-10-CM | POA: Diagnosis not present

## 2018-11-05 DIAGNOSIS — I5030 Unspecified diastolic (congestive) heart failure: Secondary | ICD-10-CM | POA: Diagnosis not present

## 2018-11-05 DIAGNOSIS — I129 Hypertensive chronic kidney disease with stage 1 through stage 4 chronic kidney disease, or unspecified chronic kidney disease: Secondary | ICD-10-CM | POA: Diagnosis not present

## 2018-11-05 DIAGNOSIS — R69 Illness, unspecified: Secondary | ICD-10-CM | POA: Diagnosis not present

## 2018-11-05 DIAGNOSIS — K432 Incisional hernia without obstruction or gangrene: Secondary | ICD-10-CM | POA: Diagnosis not present

## 2018-11-05 DIAGNOSIS — K760 Fatty (change of) liver, not elsewhere classified: Secondary | ICD-10-CM | POA: Diagnosis not present

## 2018-11-10 DIAGNOSIS — R69 Illness, unspecified: Secondary | ICD-10-CM | POA: Diagnosis not present

## 2018-11-12 DIAGNOSIS — E1122 Type 2 diabetes mellitus with diabetic chronic kidney disease: Secondary | ICD-10-CM | POA: Diagnosis not present

## 2018-11-12 DIAGNOSIS — Z940 Kidney transplant status: Secondary | ICD-10-CM | POA: Diagnosis not present

## 2018-11-12 DIAGNOSIS — D899 Disorder involving the immune mechanism, unspecified: Secondary | ICD-10-CM | POA: Diagnosis not present

## 2018-11-12 DIAGNOSIS — E1169 Type 2 diabetes mellitus with other specified complication: Secondary | ICD-10-CM | POA: Diagnosis not present

## 2018-11-12 DIAGNOSIS — Z944 Liver transplant status: Secondary | ICD-10-CM | POA: Diagnosis not present

## 2018-11-19 DIAGNOSIS — H2513 Age-related nuclear cataract, bilateral: Secondary | ICD-10-CM | POA: Diagnosis not present

## 2018-11-19 DIAGNOSIS — H25041 Posterior subcapsular polar age-related cataract, right eye: Secondary | ICD-10-CM | POA: Diagnosis not present

## 2018-11-19 DIAGNOSIS — H353131 Nonexudative age-related macular degeneration, bilateral, early dry stage: Secondary | ICD-10-CM | POA: Diagnosis not present

## 2018-11-19 DIAGNOSIS — H5213 Myopia, bilateral: Secondary | ICD-10-CM | POA: Diagnosis not present

## 2018-11-19 DIAGNOSIS — H43822 Vitreomacular adhesion, left eye: Secondary | ICD-10-CM | POA: Diagnosis not present

## 2018-11-26 DIAGNOSIS — Z940 Kidney transplant status: Secondary | ICD-10-CM | POA: Diagnosis not present

## 2018-11-26 DIAGNOSIS — Z944 Liver transplant status: Secondary | ICD-10-CM | POA: Diagnosis not present

## 2018-11-26 DIAGNOSIS — D899 Disorder involving the immune mechanism, unspecified: Secondary | ICD-10-CM | POA: Diagnosis not present

## 2018-11-27 DIAGNOSIS — R69 Illness, unspecified: Secondary | ICD-10-CM | POA: Diagnosis not present

## 2018-11-27 DIAGNOSIS — Z944 Liver transplant status: Secondary | ICD-10-CM | POA: Diagnosis not present

## 2018-12-05 DIAGNOSIS — Z940 Kidney transplant status: Secondary | ICD-10-CM | POA: Diagnosis not present

## 2018-12-05 DIAGNOSIS — Z944 Liver transplant status: Secondary | ICD-10-CM | POA: Diagnosis not present

## 2018-12-05 DIAGNOSIS — N184 Chronic kidney disease, stage 4 (severe): Secondary | ICD-10-CM | POA: Diagnosis not present

## 2018-12-05 DIAGNOSIS — I129 Hypertensive chronic kidney disease with stage 1 through stage 4 chronic kidney disease, or unspecified chronic kidney disease: Secondary | ICD-10-CM | POA: Diagnosis not present

## 2018-12-05 DIAGNOSIS — E1122 Type 2 diabetes mellitus with diabetic chronic kidney disease: Secondary | ICD-10-CM | POA: Diagnosis not present

## 2018-12-05 DIAGNOSIS — M159 Polyosteoarthritis, unspecified: Secondary | ICD-10-CM | POA: Diagnosis not present

## 2018-12-05 DIAGNOSIS — Z6835 Body mass index (BMI) 35.0-35.9, adult: Secondary | ICD-10-CM | POA: Diagnosis not present

## 2018-12-11 DIAGNOSIS — D899 Disorder involving the immune mechanism, unspecified: Secondary | ICD-10-CM | POA: Diagnosis not present

## 2018-12-11 DIAGNOSIS — Z944 Liver transplant status: Secondary | ICD-10-CM | POA: Diagnosis not present

## 2018-12-11 DIAGNOSIS — Z940 Kidney transplant status: Secondary | ICD-10-CM | POA: Diagnosis not present

## 2018-12-16 DIAGNOSIS — R69 Illness, unspecified: Secondary | ICD-10-CM | POA: Diagnosis not present

## 2018-12-18 DIAGNOSIS — Z5181 Encounter for therapeutic drug level monitoring: Secondary | ICD-10-CM | POA: Diagnosis not present

## 2018-12-18 DIAGNOSIS — Z940 Kidney transplant status: Secondary | ICD-10-CM | POA: Diagnosis not present

## 2018-12-25 DIAGNOSIS — Z944 Liver transplant status: Secondary | ICD-10-CM | POA: Diagnosis not present

## 2018-12-25 DIAGNOSIS — D899 Disorder involving the immune mechanism, unspecified: Secondary | ICD-10-CM | POA: Diagnosis not present

## 2018-12-31 DIAGNOSIS — Z87891 Personal history of nicotine dependence: Secondary | ICD-10-CM | POA: Diagnosis not present

## 2018-12-31 DIAGNOSIS — Z298 Encounter for other specified prophylactic measures: Secondary | ICD-10-CM | POA: Diagnosis not present

## 2018-12-31 DIAGNOSIS — G2581 Restless legs syndrome: Secondary | ICD-10-CM | POA: Diagnosis not present

## 2018-12-31 DIAGNOSIS — Z79899 Other long term (current) drug therapy: Secondary | ICD-10-CM | POA: Diagnosis not present

## 2018-12-31 DIAGNOSIS — I10 Essential (primary) hypertension: Secondary | ICD-10-CM | POA: Diagnosis not present

## 2018-12-31 DIAGNOSIS — Z23 Encounter for immunization: Secondary | ICD-10-CM | POA: Diagnosis not present

## 2018-12-31 DIAGNOSIS — Z944 Liver transplant status: Secondary | ICD-10-CM | POA: Diagnosis not present

## 2018-12-31 DIAGNOSIS — Z940 Kidney transplant status: Secondary | ICD-10-CM | POA: Diagnosis not present

## 2018-12-31 DIAGNOSIS — Z9884 Bariatric surgery status: Secondary | ICD-10-CM | POA: Diagnosis not present

## 2018-12-31 DIAGNOSIS — Z87440 Personal history of urinary (tract) infections: Secondary | ICD-10-CM | POA: Diagnosis not present

## 2018-12-31 DIAGNOSIS — Z48288 Encounter for aftercare following multiple organ transplant: Secondary | ICD-10-CM | POA: Diagnosis not present

## 2019-01-15 DIAGNOSIS — Z6836 Body mass index (BMI) 36.0-36.9, adult: Secondary | ICD-10-CM | POA: Diagnosis not present

## 2019-01-15 DIAGNOSIS — M7660 Achilles tendinitis, unspecified leg: Secondary | ICD-10-CM | POA: Diagnosis not present

## 2019-01-16 DIAGNOSIS — D899 Disorder involving the immune mechanism, unspecified: Secondary | ICD-10-CM | POA: Diagnosis not present

## 2019-01-16 DIAGNOSIS — Z944 Liver transplant status: Secondary | ICD-10-CM | POA: Diagnosis not present

## 2019-02-17 DIAGNOSIS — Z944 Liver transplant status: Secondary | ICD-10-CM | POA: Diagnosis not present

## 2019-02-17 DIAGNOSIS — D899 Disorder involving the immune mechanism, unspecified: Secondary | ICD-10-CM | POA: Diagnosis not present

## 2019-02-17 DIAGNOSIS — Z940 Kidney transplant status: Secondary | ICD-10-CM | POA: Diagnosis not present

## 2019-02-17 DIAGNOSIS — R69 Illness, unspecified: Secondary | ICD-10-CM | POA: Diagnosis not present

## 2019-02-19 DIAGNOSIS — D225 Melanocytic nevi of trunk: Secondary | ICD-10-CM | POA: Diagnosis not present

## 2019-02-19 DIAGNOSIS — L814 Other melanin hyperpigmentation: Secondary | ICD-10-CM | POA: Diagnosis not present

## 2019-02-19 DIAGNOSIS — D1801 Hemangioma of skin and subcutaneous tissue: Secondary | ICD-10-CM | POA: Diagnosis not present

## 2019-02-19 DIAGNOSIS — Z1283 Encounter for screening for malignant neoplasm of skin: Secondary | ICD-10-CM | POA: Diagnosis not present

## 2019-02-19 DIAGNOSIS — Q825 Congenital non-neoplastic nevus: Secondary | ICD-10-CM | POA: Diagnosis not present

## 2019-02-19 DIAGNOSIS — L821 Other seborrheic keratosis: Secondary | ICD-10-CM | POA: Diagnosis not present

## 2019-02-19 DIAGNOSIS — D2270 Melanocytic nevi of unspecified lower limb, including hip: Secondary | ICD-10-CM | POA: Diagnosis not present

## 2019-02-19 DIAGNOSIS — L304 Erythema intertrigo: Secondary | ICD-10-CM | POA: Diagnosis not present

## 2019-02-19 DIAGNOSIS — R69 Illness, unspecified: Secondary | ICD-10-CM | POA: Diagnosis not present

## 2019-02-19 DIAGNOSIS — D692 Other nonthrombocytopenic purpura: Secondary | ICD-10-CM | POA: Diagnosis not present

## 2019-02-19 DIAGNOSIS — D849 Immunodeficiency, unspecified: Secondary | ICD-10-CM | POA: Diagnosis not present

## 2019-02-20 DIAGNOSIS — R69 Illness, unspecified: Secondary | ICD-10-CM | POA: Diagnosis not present

## 2019-03-18 DIAGNOSIS — D899 Disorder involving the immune mechanism, unspecified: Secondary | ICD-10-CM | POA: Diagnosis not present

## 2019-03-18 DIAGNOSIS — Z944 Liver transplant status: Secondary | ICD-10-CM | POA: Diagnosis not present

## 2019-03-18 DIAGNOSIS — Z940 Kidney transplant status: Secondary | ICD-10-CM | POA: Diagnosis not present

## 2019-03-25 DIAGNOSIS — Z005 Encounter for examination of potential donor of organ and tissue: Secondary | ICD-10-CM | POA: Diagnosis not present

## 2019-03-25 DIAGNOSIS — Z944 Liver transplant status: Secondary | ICD-10-CM | POA: Diagnosis not present

## 2019-03-25 DIAGNOSIS — R69 Illness, unspecified: Secondary | ICD-10-CM | POA: Diagnosis not present

## 2019-03-25 DIAGNOSIS — T8643 Liver transplant infection: Secondary | ICD-10-CM | POA: Diagnosis not present

## 2019-04-20 DIAGNOSIS — R69 Illness, unspecified: Secondary | ICD-10-CM | POA: Diagnosis not present

## 2019-04-22 DIAGNOSIS — R69 Illness, unspecified: Secondary | ICD-10-CM | POA: Diagnosis not present

## 2019-04-26 IMAGING — US US EXTREM LOW VENOUS BILAT
1 series · 13 of 24 positions shown · non-contrast
Comparison: No prior.

CLINICAL DATA: Lower extremity swelling .



[Series 1: us extrem low venous bilat · 0.08mm/px · 13 of 63 slices shown]
[im 1/63]
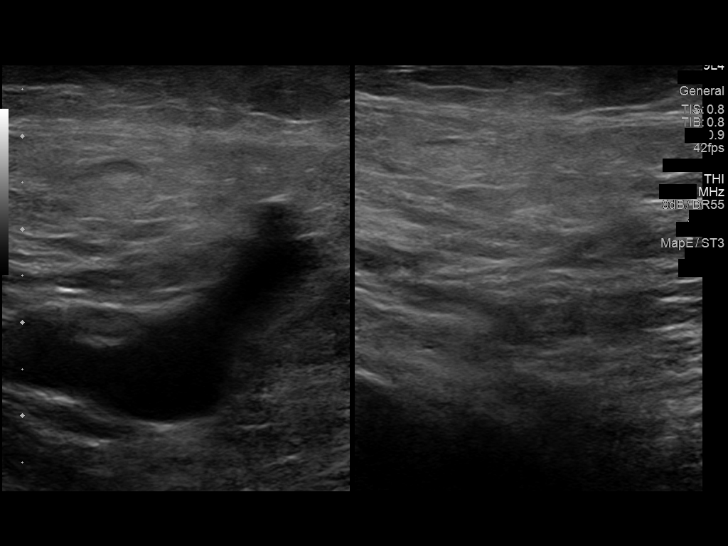
[im 6/63]
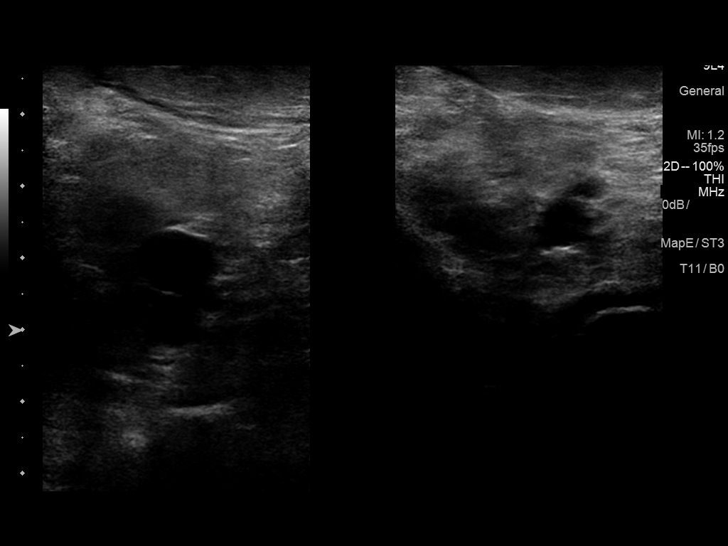
[im 11/63]
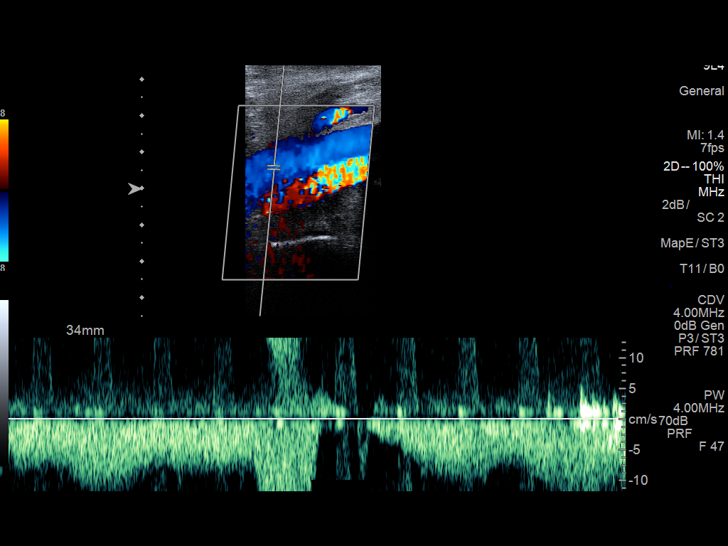
[im 17/63]
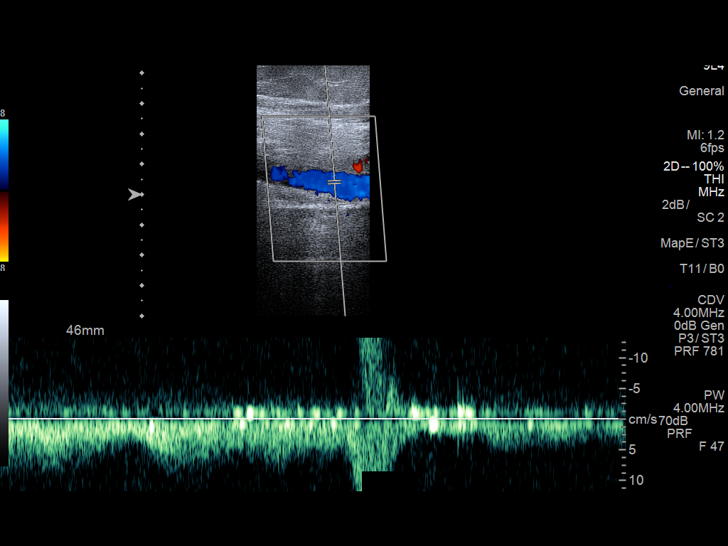
[im 22/63]
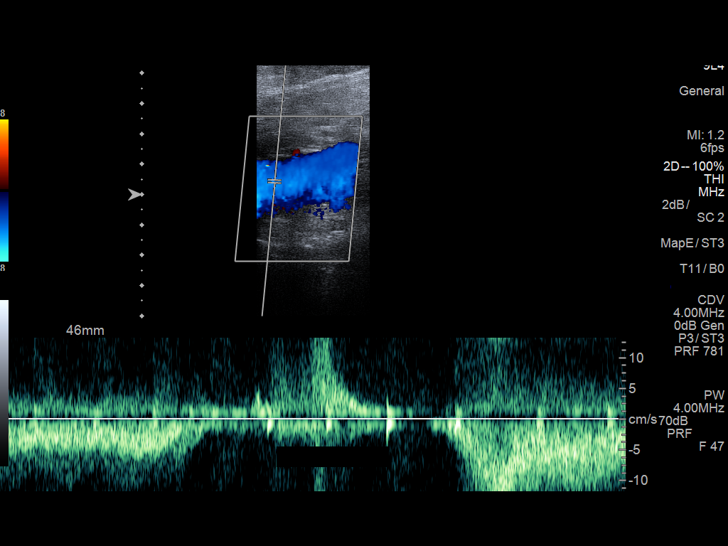
[im 27/63]
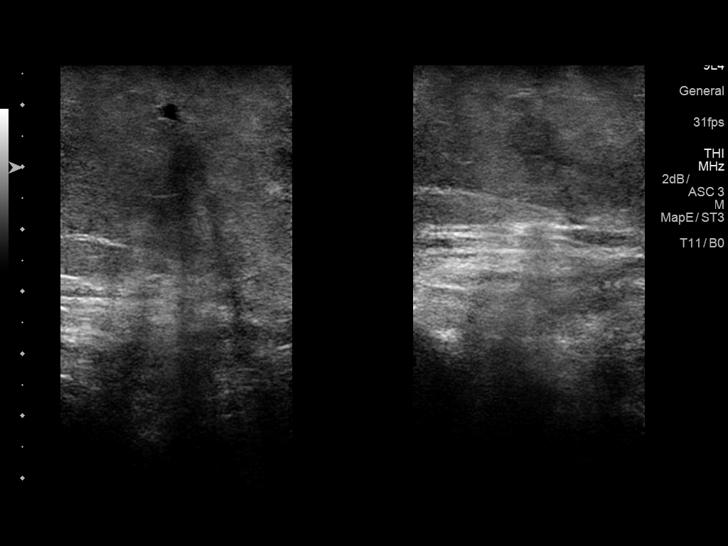
[im 33/63]
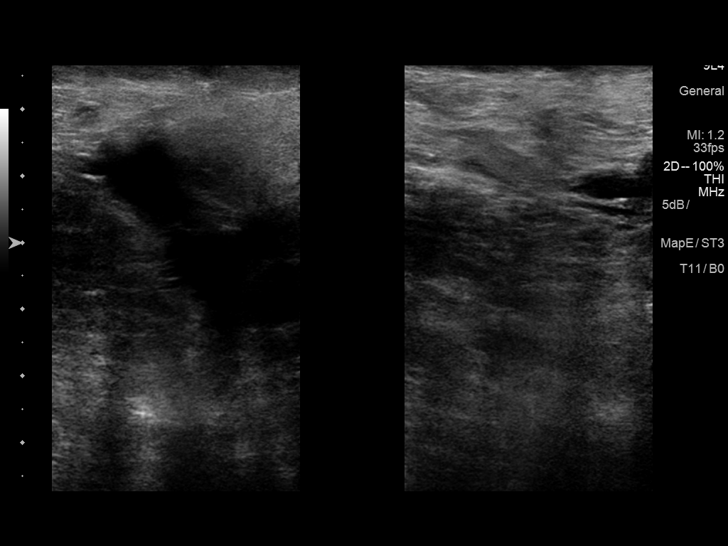
[im 36/63]
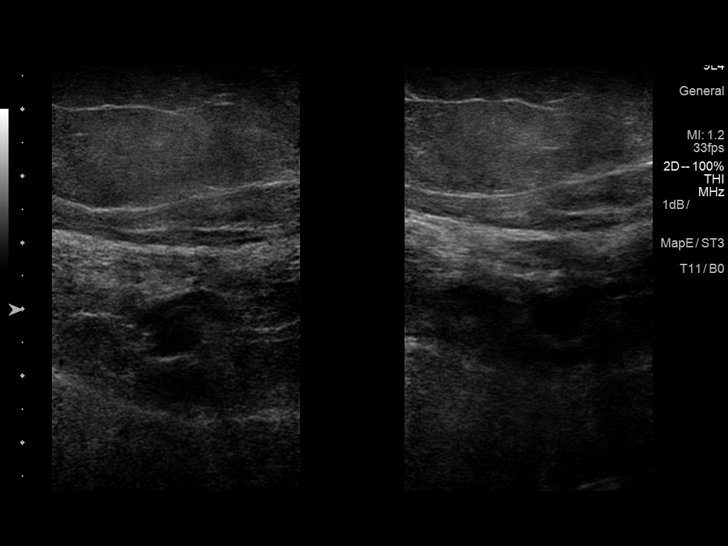
[im 41/63]
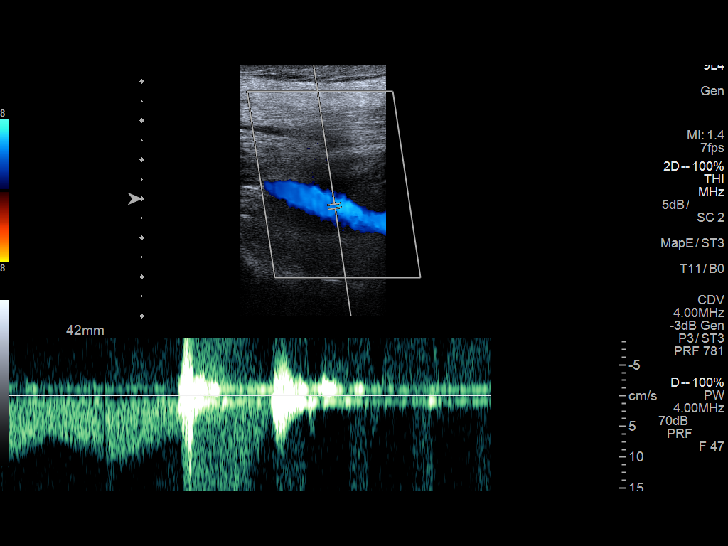
[im 46/63]
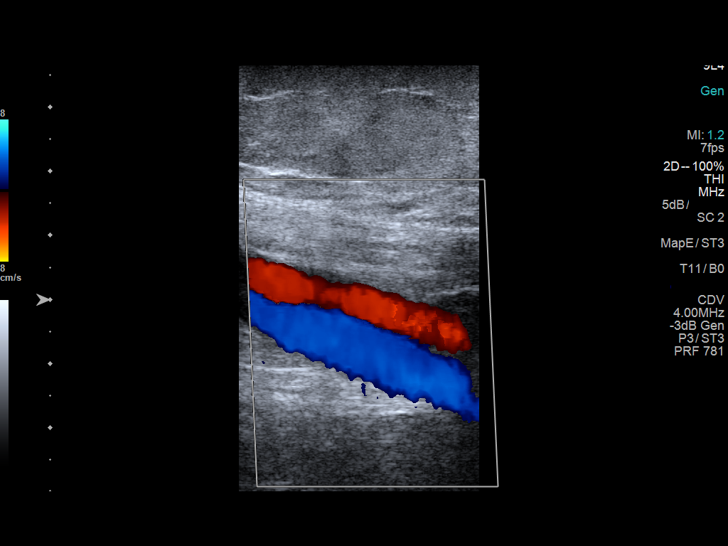
[im 52/63]
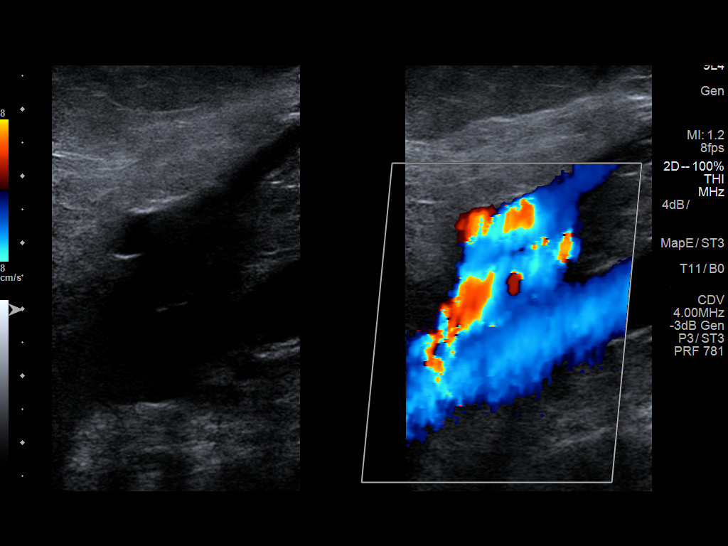
[im 57/63]
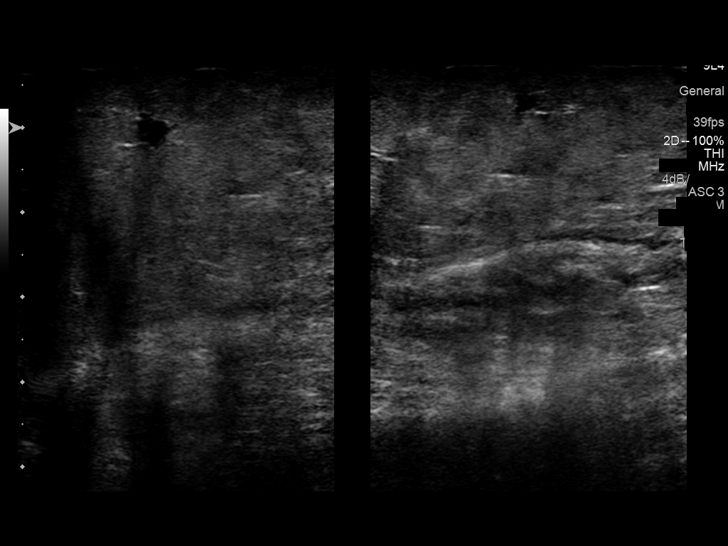
[im 63/63]
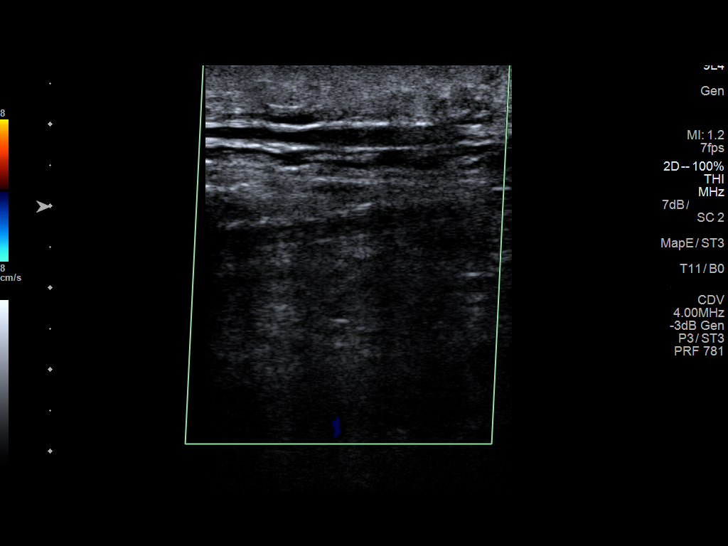

[13 of 24 positions shown; findings below may reference images not displayed]

FINDINGS: RIGHT LOWER EXTREMITY

Common Femoral Vein: No evidence of thrombus. Normal
compressibility, respiratory phasicity and response to augmentation.

Saphenofemoral Junction: No evidence of thrombus. Normal
compressibility and flow on color Doppler imaging.

Profunda Femoral Vein: No evidence of thrombus. Normal
compressibility and flow on color Doppler imaging.

Femoral Vein: No evidence of thrombus. Normal compressibility,
respiratory phasicity and response to augmentation.

Popliteal Vein: No evidence of thrombus. Normal compressibility,
respiratory phasicity and response to augmentation.

Calf Veins: No evidence of thrombus. Normal compressibility and flow
on color Doppler imaging. Peroneal veins not identified.

Superficial Great Saphenous Vein: No evidence of thrombus. Normal
compressibility and flow on color Doppler imaging.

Other Findings:  None.

LEFT LOWER EXTREMITY

Common Femoral Vein: No evidence of thrombus. Normal
compressibility, respiratory phasicity and response to augmentation.

Saphenofemoral Junction: No evidence of thrombus. Normal
compressibility and flow on color Doppler imaging.

Profunda Femoral Vein: No evidence of thrombus. Normal
compressibility and flow on color Doppler imaging.

Femoral Vein: No evidence of thrombus. Normal compressibility,
respiratory phasicity and response to augmentation.

Popliteal Vein: No evidence of thrombus. Normal compressibility,
respiratory phasicity and response to augmentation.

Calf Veins: No evidence of thrombus. Normal compressibility and flow
on color Doppler imaging. Peroneal veins not identified .

Superficial Great Saphenous Vein: No evidence of thrombus. Normal
compressibility and flow on color Doppler imaging.

Other Findings:  None.
IMPRESSION: No evidence of DVT within either lower extremity.

## 2019-05-11 DIAGNOSIS — Z940 Kidney transplant status: Secondary | ICD-10-CM | POA: Diagnosis not present

## 2019-05-11 DIAGNOSIS — Z87891 Personal history of nicotine dependence: Secondary | ICD-10-CM | POA: Diagnosis not present

## 2019-05-11 DIAGNOSIS — Z949 Transplanted organ and tissue status, unspecified: Secondary | ICD-10-CM | POA: Diagnosis not present

## 2019-05-11 DIAGNOSIS — I10 Essential (primary) hypertension: Secondary | ICD-10-CM | POA: Diagnosis not present

## 2019-05-11 DIAGNOSIS — Z8639 Personal history of other endocrine, nutritional and metabolic disease: Secondary | ICD-10-CM | POA: Diagnosis not present

## 2019-05-11 DIAGNOSIS — Z4822 Encounter for aftercare following kidney transplant: Secondary | ICD-10-CM | POA: Diagnosis not present

## 2019-05-11 DIAGNOSIS — Z944 Liver transplant status: Secondary | ICD-10-CM | POA: Diagnosis not present

## 2019-05-11 DIAGNOSIS — Z79899 Other long term (current) drug therapy: Secondary | ICD-10-CM | POA: Diagnosis not present

## 2019-05-11 DIAGNOSIS — D849 Immunodeficiency, unspecified: Secondary | ICD-10-CM | POA: Diagnosis not present

## 2019-05-12 DIAGNOSIS — H25041 Posterior subcapsular polar age-related cataract, right eye: Secondary | ICD-10-CM | POA: Diagnosis not present

## 2019-05-12 DIAGNOSIS — H2513 Age-related nuclear cataract, bilateral: Secondary | ICD-10-CM | POA: Diagnosis not present

## 2019-05-12 DIAGNOSIS — H43822 Vitreomacular adhesion, left eye: Secondary | ICD-10-CM | POA: Diagnosis not present

## 2019-05-12 DIAGNOSIS — H353131 Nonexudative age-related macular degeneration, bilateral, early dry stage: Secondary | ICD-10-CM | POA: Diagnosis not present

## 2019-06-17 DIAGNOSIS — R69 Illness, unspecified: Secondary | ICD-10-CM | POA: Diagnosis not present

## 2019-07-09 DIAGNOSIS — Z6835 Body mass index (BMI) 35.0-35.9, adult: Secondary | ICD-10-CM | POA: Diagnosis not present

## 2019-07-09 DIAGNOSIS — N3001 Acute cystitis with hematuria: Secondary | ICD-10-CM | POA: Diagnosis not present

## 2019-07-09 DIAGNOSIS — Z940 Kidney transplant status: Secondary | ICD-10-CM | POA: Diagnosis not present

## 2019-07-16 DIAGNOSIS — Z5181 Encounter for therapeutic drug level monitoring: Secondary | ICD-10-CM | POA: Diagnosis not present

## 2019-07-16 DIAGNOSIS — K432 Incisional hernia without obstruction or gangrene: Secondary | ICD-10-CM | POA: Diagnosis not present

## 2019-07-16 DIAGNOSIS — I119 Hypertensive heart disease without heart failure: Secondary | ICD-10-CM | POA: Diagnosis not present

## 2019-07-16 DIAGNOSIS — N184 Chronic kidney disease, stage 4 (severe): Secondary | ICD-10-CM | POA: Diagnosis not present

## 2019-07-16 DIAGNOSIS — E1122 Type 2 diabetes mellitus with diabetic chronic kidney disease: Secondary | ICD-10-CM | POA: Diagnosis not present

## 2019-07-16 DIAGNOSIS — N390 Urinary tract infection, site not specified: Secondary | ICD-10-CM | POA: Diagnosis not present

## 2019-07-16 DIAGNOSIS — R319 Hematuria, unspecified: Secondary | ICD-10-CM | POA: Diagnosis not present

## 2019-07-16 DIAGNOSIS — Z949 Transplanted organ and tissue status, unspecified: Secondary | ICD-10-CM | POA: Diagnosis not present

## 2019-07-16 DIAGNOSIS — Z940 Kidney transplant status: Secondary | ICD-10-CM | POA: Diagnosis not present

## 2019-07-16 DIAGNOSIS — Z9483 Pancreas transplant status: Secondary | ICD-10-CM | POA: Diagnosis not present

## 2019-07-16 DIAGNOSIS — I5030 Unspecified diastolic (congestive) heart failure: Secondary | ICD-10-CM | POA: Diagnosis not present

## 2019-07-16 DIAGNOSIS — I129 Hypertensive chronic kidney disease with stage 1 through stage 4 chronic kidney disease, or unspecified chronic kidney disease: Secondary | ICD-10-CM | POA: Diagnosis not present

## 2019-07-30 DIAGNOSIS — R69 Illness, unspecified: Secondary | ICD-10-CM | POA: Diagnosis not present

## 2019-08-12 DIAGNOSIS — R69 Illness, unspecified: Secondary | ICD-10-CM | POA: Diagnosis not present

## 2019-09-08 DIAGNOSIS — Z944 Liver transplant status: Secondary | ICD-10-CM | POA: Diagnosis not present

## 2019-09-08 DIAGNOSIS — D849 Immunodeficiency, unspecified: Secondary | ICD-10-CM | POA: Diagnosis not present

## 2019-09-08 DIAGNOSIS — Z940 Kidney transplant status: Secondary | ICD-10-CM | POA: Diagnosis not present

## 2019-09-08 DIAGNOSIS — Z298 Encounter for other specified prophylactic measures: Secondary | ICD-10-CM | POA: Diagnosis not present

## 2019-09-08 DIAGNOSIS — D899 Disorder involving the immune mechanism, unspecified: Secondary | ICD-10-CM | POA: Diagnosis not present

## 2019-09-16 DIAGNOSIS — Q825 Congenital non-neoplastic nevus: Secondary | ICD-10-CM | POA: Diagnosis not present

## 2019-09-16 DIAGNOSIS — L304 Erythema intertrigo: Secondary | ICD-10-CM | POA: Diagnosis not present

## 2019-09-16 DIAGNOSIS — D2270 Melanocytic nevi of unspecified lower limb, including hip: Secondary | ICD-10-CM | POA: Diagnosis not present

## 2019-09-16 DIAGNOSIS — Z1283 Encounter for screening for malignant neoplasm of skin: Secondary | ICD-10-CM | POA: Diagnosis not present

## 2019-09-16 DIAGNOSIS — D849 Immunodeficiency, unspecified: Secondary | ICD-10-CM | POA: Diagnosis not present

## 2019-09-16 DIAGNOSIS — L821 Other seborrheic keratosis: Secondary | ICD-10-CM | POA: Diagnosis not present

## 2019-09-16 DIAGNOSIS — L814 Other melanin hyperpigmentation: Secondary | ICD-10-CM | POA: Diagnosis not present

## 2019-09-16 DIAGNOSIS — D225 Melanocytic nevi of trunk: Secondary | ICD-10-CM | POA: Diagnosis not present

## 2019-09-16 DIAGNOSIS — D1801 Hemangioma of skin and subcutaneous tissue: Secondary | ICD-10-CM | POA: Diagnosis not present

## 2019-09-16 DIAGNOSIS — L853 Xerosis cutis: Secondary | ICD-10-CM | POA: Diagnosis not present

## 2019-09-22 DIAGNOSIS — Z944 Liver transplant status: Secondary | ICD-10-CM | POA: Diagnosis not present

## 2019-09-22 DIAGNOSIS — D899 Disorder involving the immune mechanism, unspecified: Secondary | ICD-10-CM | POA: Diagnosis not present

## 2019-09-22 DIAGNOSIS — Z940 Kidney transplant status: Secondary | ICD-10-CM | POA: Diagnosis not present

## 2019-09-22 DIAGNOSIS — Z298 Encounter for other specified prophylactic measures: Secondary | ICD-10-CM | POA: Diagnosis not present

## 2019-09-22 DIAGNOSIS — D849 Immunodeficiency, unspecified: Secondary | ICD-10-CM | POA: Diagnosis not present

## 2019-10-06 DIAGNOSIS — E1122 Type 2 diabetes mellitus with diabetic chronic kidney disease: Secondary | ICD-10-CM | POA: Diagnosis not present

## 2019-10-06 DIAGNOSIS — E785 Hyperlipidemia, unspecified: Secondary | ICD-10-CM | POA: Diagnosis not present

## 2019-10-06 DIAGNOSIS — Z124 Encounter for screening for malignant neoplasm of cervix: Secondary | ICD-10-CM | POA: Diagnosis not present

## 2019-10-06 DIAGNOSIS — K7460 Unspecified cirrhosis of liver: Secondary | ICD-10-CM | POA: Diagnosis not present

## 2019-10-06 DIAGNOSIS — N184 Chronic kidney disease, stage 4 (severe): Secondary | ICD-10-CM | POA: Diagnosis not present

## 2019-10-06 DIAGNOSIS — Z0000 Encounter for general adult medical examination without abnormal findings: Secondary | ICD-10-CM | POA: Diagnosis not present

## 2019-10-06 DIAGNOSIS — I129 Hypertensive chronic kidney disease with stage 1 through stage 4 chronic kidney disease, or unspecified chronic kidney disease: Secondary | ICD-10-CM | POA: Diagnosis not present

## 2019-10-06 DIAGNOSIS — R69 Illness, unspecified: Secondary | ICD-10-CM | POA: Diagnosis not present

## 2019-10-06 DIAGNOSIS — I5030 Unspecified diastolic (congestive) heart failure: Secondary | ICD-10-CM | POA: Diagnosis not present

## 2019-10-06 DIAGNOSIS — G2581 Restless legs syndrome: Secondary | ICD-10-CM | POA: Diagnosis not present

## 2019-10-20 DIAGNOSIS — Z20828 Contact with and (suspected) exposure to other viral communicable diseases: Secondary | ICD-10-CM | POA: Diagnosis not present

## 2019-10-20 DIAGNOSIS — R0602 Shortness of breath: Secondary | ICD-10-CM | POA: Diagnosis not present

## 2019-10-21 DIAGNOSIS — Z944 Liver transplant status: Secondary | ICD-10-CM | POA: Diagnosis not present

## 2019-10-21 DIAGNOSIS — D849 Immunodeficiency, unspecified: Secondary | ICD-10-CM | POA: Diagnosis not present

## 2019-10-21 DIAGNOSIS — Z940 Kidney transplant status: Secondary | ICD-10-CM | POA: Diagnosis not present

## 2019-10-21 DIAGNOSIS — Z298 Encounter for other specified prophylactic measures: Secondary | ICD-10-CM | POA: Diagnosis not present

## 2019-10-21 DIAGNOSIS — D899 Disorder involving the immune mechanism, unspecified: Secondary | ICD-10-CM | POA: Diagnosis not present

## 2019-11-10 DIAGNOSIS — R69 Illness, unspecified: Secondary | ICD-10-CM | POA: Diagnosis not present

## 2019-11-16 DIAGNOSIS — D899 Disorder involving the immune mechanism, unspecified: Secondary | ICD-10-CM | POA: Diagnosis not present

## 2019-11-16 DIAGNOSIS — D849 Immunodeficiency, unspecified: Secondary | ICD-10-CM | POA: Diagnosis not present

## 2019-11-16 DIAGNOSIS — Z298 Encounter for other specified prophylactic measures: Secondary | ICD-10-CM | POA: Diagnosis not present

## 2019-11-16 DIAGNOSIS — Z944 Liver transplant status: Secondary | ICD-10-CM | POA: Diagnosis not present

## 2019-11-16 DIAGNOSIS — Z940 Kidney transplant status: Secondary | ICD-10-CM | POA: Diagnosis not present

## 2019-12-01 DIAGNOSIS — D849 Immunodeficiency, unspecified: Secondary | ICD-10-CM | POA: Diagnosis not present

## 2019-12-01 DIAGNOSIS — Z298 Encounter for other specified prophylactic measures: Secondary | ICD-10-CM | POA: Diagnosis not present

## 2019-12-01 DIAGNOSIS — Z944 Liver transplant status: Secondary | ICD-10-CM | POA: Diagnosis not present

## 2019-12-01 DIAGNOSIS — D899 Disorder involving the immune mechanism, unspecified: Secondary | ICD-10-CM | POA: Diagnosis not present

## 2019-12-01 DIAGNOSIS — Z940 Kidney transplant status: Secondary | ICD-10-CM | POA: Diagnosis not present

## 2019-12-07 DIAGNOSIS — R69 Illness, unspecified: Secondary | ICD-10-CM | POA: Diagnosis not present

## 2019-12-07 DIAGNOSIS — Z944 Liver transplant status: Secondary | ICD-10-CM | POA: Diagnosis not present

## 2019-12-18 DIAGNOSIS — H2513 Age-related nuclear cataract, bilateral: Secondary | ICD-10-CM | POA: Diagnosis not present

## 2019-12-18 DIAGNOSIS — H5203 Hypermetropia, bilateral: Secondary | ICD-10-CM | POA: Diagnosis not present

## 2019-12-18 DIAGNOSIS — H353131 Nonexudative age-related macular degeneration, bilateral, early dry stage: Secondary | ICD-10-CM | POA: Diagnosis not present

## 2019-12-18 DIAGNOSIS — H25043 Posterior subcapsular polar age-related cataract, bilateral: Secondary | ICD-10-CM | POA: Diagnosis not present

## 2019-12-18 DIAGNOSIS — H43822 Vitreomacular adhesion, left eye: Secondary | ICD-10-CM | POA: Diagnosis not present

## 2019-12-21 DIAGNOSIS — Z940 Kidney transplant status: Secondary | ICD-10-CM | POA: Diagnosis not present

## 2019-12-21 DIAGNOSIS — D849 Immunodeficiency, unspecified: Secondary | ICD-10-CM | POA: Diagnosis not present

## 2019-12-21 DIAGNOSIS — Z944 Liver transplant status: Secondary | ICD-10-CM | POA: Diagnosis not present

## 2019-12-21 DIAGNOSIS — Z298 Encounter for other specified prophylactic measures: Secondary | ICD-10-CM | POA: Diagnosis not present

## 2019-12-21 DIAGNOSIS — D899 Disorder involving the immune mechanism, unspecified: Secondary | ICD-10-CM | POA: Diagnosis not present

## 2020-01-06 DIAGNOSIS — N189 Chronic kidney disease, unspecified: Secondary | ICD-10-CM | POA: Diagnosis not present

## 2020-01-06 DIAGNOSIS — Z87891 Personal history of nicotine dependence: Secondary | ICD-10-CM | POA: Diagnosis not present

## 2020-01-06 DIAGNOSIS — E669 Obesity, unspecified: Secondary | ICD-10-CM | POA: Diagnosis not present

## 2020-01-06 DIAGNOSIS — N390 Urinary tract infection, site not specified: Secondary | ICD-10-CM | POA: Diagnosis not present

## 2020-01-06 DIAGNOSIS — D84821 Immunodeficiency due to drugs: Secondary | ICD-10-CM | POA: Diagnosis not present

## 2020-01-06 DIAGNOSIS — Z79899 Other long term (current) drug therapy: Secondary | ICD-10-CM | POA: Diagnosis not present

## 2020-01-06 DIAGNOSIS — Z940 Kidney transplant status: Secondary | ICD-10-CM | POA: Diagnosis not present

## 2020-01-06 DIAGNOSIS — Z944 Liver transplant status: Secondary | ICD-10-CM | POA: Diagnosis not present

## 2020-01-06 DIAGNOSIS — R634 Abnormal weight loss: Secondary | ICD-10-CM | POA: Diagnosis not present

## 2020-01-06 DIAGNOSIS — D849 Immunodeficiency, unspecified: Secondary | ICD-10-CM | POA: Diagnosis not present

## 2020-01-06 DIAGNOSIS — Z4823 Encounter for aftercare following liver transplant: Secondary | ICD-10-CM | POA: Diagnosis not present

## 2020-01-06 DIAGNOSIS — K432 Incisional hernia without obstruction or gangrene: Secondary | ICD-10-CM | POA: Diagnosis not present

## 2020-01-06 DIAGNOSIS — R69 Illness, unspecified: Secondary | ICD-10-CM | POA: Diagnosis not present

## 2020-01-18 DIAGNOSIS — Z940 Kidney transplant status: Secondary | ICD-10-CM | POA: Diagnosis not present

## 2020-01-18 DIAGNOSIS — D849 Immunodeficiency, unspecified: Secondary | ICD-10-CM | POA: Diagnosis not present

## 2020-01-19 DIAGNOSIS — Z944 Liver transplant status: Secondary | ICD-10-CM | POA: Diagnosis not present

## 2020-01-19 DIAGNOSIS — G629 Polyneuropathy, unspecified: Secondary | ICD-10-CM | POA: Diagnosis not present

## 2020-01-19 DIAGNOSIS — Z79899 Other long term (current) drug therapy: Secondary | ICD-10-CM | POA: Diagnosis not present

## 2020-01-19 DIAGNOSIS — H547 Unspecified visual loss: Secondary | ICD-10-CM | POA: Diagnosis not present

## 2020-01-19 DIAGNOSIS — R030 Elevated blood-pressure reading, without diagnosis of hypertension: Secondary | ICD-10-CM | POA: Diagnosis not present

## 2020-01-19 DIAGNOSIS — K739 Chronic hepatitis, unspecified: Secondary | ICD-10-CM | POA: Diagnosis not present

## 2020-01-19 DIAGNOSIS — G2581 Restless legs syndrome: Secondary | ICD-10-CM | POA: Diagnosis not present

## 2020-01-19 DIAGNOSIS — Z008 Encounter for other general examination: Secondary | ICD-10-CM | POA: Diagnosis not present

## 2020-01-19 DIAGNOSIS — Z7982 Long term (current) use of aspirin: Secondary | ICD-10-CM | POA: Diagnosis not present

## 2020-01-19 DIAGNOSIS — R69 Illness, unspecified: Secondary | ICD-10-CM | POA: Diagnosis not present

## 2020-01-22 DIAGNOSIS — K432 Incisional hernia without obstruction or gangrene: Secondary | ICD-10-CM | POA: Diagnosis not present

## 2020-01-22 DIAGNOSIS — Z23 Encounter for immunization: Secondary | ICD-10-CM | POA: Diagnosis not present

## 2020-01-22 DIAGNOSIS — Z87891 Personal history of nicotine dependence: Secondary | ICD-10-CM | POA: Diagnosis not present

## 2020-01-22 DIAGNOSIS — Z944 Liver transplant status: Secondary | ICD-10-CM | POA: Diagnosis not present

## 2020-01-22 DIAGNOSIS — Z79899 Other long term (current) drug therapy: Secondary | ICD-10-CM | POA: Diagnosis not present

## 2020-01-22 DIAGNOSIS — Z940 Kidney transplant status: Secondary | ICD-10-CM | POA: Diagnosis not present

## 2020-01-22 DIAGNOSIS — D849 Immunodeficiency, unspecified: Secondary | ICD-10-CM | POA: Diagnosis not present

## 2020-01-22 DIAGNOSIS — Z6837 Body mass index (BMI) 37.0-37.9, adult: Secondary | ICD-10-CM | POA: Diagnosis not present

## 2020-01-22 DIAGNOSIS — D84821 Immunodeficiency due to drugs: Secondary | ICD-10-CM | POA: Diagnosis not present

## 2020-01-22 DIAGNOSIS — E669 Obesity, unspecified: Secondary | ICD-10-CM | POA: Diagnosis not present

## 2020-01-22 DIAGNOSIS — I10 Essential (primary) hypertension: Secondary | ICD-10-CM | POA: Diagnosis not present

## 2020-01-28 DIAGNOSIS — H18413 Arcus senilis, bilateral: Secondary | ICD-10-CM | POA: Diagnosis not present

## 2020-01-28 DIAGNOSIS — H2513 Age-related nuclear cataract, bilateral: Secondary | ICD-10-CM | POA: Diagnosis not present

## 2020-01-28 DIAGNOSIS — H25043 Posterior subcapsular polar age-related cataract, bilateral: Secondary | ICD-10-CM | POA: Diagnosis not present

## 2020-01-28 DIAGNOSIS — H2511 Age-related nuclear cataract, right eye: Secondary | ICD-10-CM | POA: Diagnosis not present

## 2020-01-28 DIAGNOSIS — H25013 Cortical age-related cataract, bilateral: Secondary | ICD-10-CM | POA: Diagnosis not present

## 2020-02-03 DIAGNOSIS — E1122 Type 2 diabetes mellitus with diabetic chronic kidney disease: Secondary | ICD-10-CM | POA: Diagnosis not present

## 2020-02-03 DIAGNOSIS — N184 Chronic kidney disease, stage 4 (severe): Secondary | ICD-10-CM | POA: Diagnosis not present

## 2020-02-03 DIAGNOSIS — Z6837 Body mass index (BMI) 37.0-37.9, adult: Secondary | ICD-10-CM | POA: Diagnosis not present

## 2020-02-03 DIAGNOSIS — K432 Incisional hernia without obstruction or gangrene: Secondary | ICD-10-CM | POA: Diagnosis not present

## 2020-02-03 DIAGNOSIS — I129 Hypertensive chronic kidney disease with stage 1 through stage 4 chronic kidney disease, or unspecified chronic kidney disease: Secondary | ICD-10-CM | POA: Diagnosis not present

## 2020-02-03 DIAGNOSIS — Z949 Transplanted organ and tissue status, unspecified: Secondary | ICD-10-CM | POA: Diagnosis not present

## 2020-02-17 DIAGNOSIS — H2511 Age-related nuclear cataract, right eye: Secondary | ICD-10-CM | POA: Diagnosis not present

## 2020-02-18 DIAGNOSIS — H2512 Age-related nuclear cataract, left eye: Secondary | ICD-10-CM | POA: Diagnosis not present

## 2020-02-22 DIAGNOSIS — R69 Illness, unspecified: Secondary | ICD-10-CM | POA: Diagnosis not present

## 2020-03-02 DIAGNOSIS — H2512 Age-related nuclear cataract, left eye: Secondary | ICD-10-CM | POA: Diagnosis not present

## 2020-03-15 DIAGNOSIS — L821 Other seborrheic keratosis: Secondary | ICD-10-CM | POA: Diagnosis not present

## 2020-03-15 DIAGNOSIS — L814 Other melanin hyperpigmentation: Secondary | ICD-10-CM | POA: Diagnosis not present

## 2020-03-15 DIAGNOSIS — Z1283 Encounter for screening for malignant neoplasm of skin: Secondary | ICD-10-CM | POA: Diagnosis not present

## 2020-03-15 DIAGNOSIS — D2270 Melanocytic nevi of unspecified lower limb, including hip: Secondary | ICD-10-CM | POA: Diagnosis not present

## 2020-03-15 DIAGNOSIS — D2260 Melanocytic nevi of unspecified upper limb, including shoulder: Secondary | ICD-10-CM | POA: Diagnosis not present

## 2020-03-15 DIAGNOSIS — L304 Erythema intertrigo: Secondary | ICD-10-CM | POA: Diagnosis not present

## 2020-03-15 DIAGNOSIS — Q825 Congenital non-neoplastic nevus: Secondary | ICD-10-CM | POA: Diagnosis not present

## 2020-03-15 DIAGNOSIS — D225 Melanocytic nevi of trunk: Secondary | ICD-10-CM | POA: Diagnosis not present

## 2020-03-15 DIAGNOSIS — D849 Immunodeficiency, unspecified: Secondary | ICD-10-CM | POA: Diagnosis not present

## 2020-03-15 DIAGNOSIS — D1801 Hemangioma of skin and subcutaneous tissue: Secondary | ICD-10-CM | POA: Diagnosis not present

## 2020-03-16 DIAGNOSIS — Z940 Kidney transplant status: Secondary | ICD-10-CM | POA: Diagnosis not present

## 2020-03-16 DIAGNOSIS — D849 Immunodeficiency, unspecified: Secondary | ICD-10-CM | POA: Diagnosis not present

## 2020-03-16 DIAGNOSIS — D899 Disorder involving the immune mechanism, unspecified: Secondary | ICD-10-CM | POA: Diagnosis not present

## 2020-03-16 DIAGNOSIS — Z944 Liver transplant status: Secondary | ICD-10-CM | POA: Diagnosis not present

## 2020-03-16 DIAGNOSIS — Z298 Encounter for other specified prophylactic measures: Secondary | ICD-10-CM | POA: Diagnosis not present

## 2020-04-06 DIAGNOSIS — E43 Unspecified severe protein-calorie malnutrition: Secondary | ICD-10-CM | POA: Diagnosis not present

## 2020-04-06 DIAGNOSIS — E1122 Type 2 diabetes mellitus with diabetic chronic kidney disease: Secondary | ICD-10-CM | POA: Diagnosis not present

## 2020-04-06 DIAGNOSIS — Z6836 Body mass index (BMI) 36.0-36.9, adult: Secondary | ICD-10-CM | POA: Diagnosis not present

## 2020-04-06 DIAGNOSIS — N184 Chronic kidney disease, stage 4 (severe): Secondary | ICD-10-CM | POA: Diagnosis not present

## 2020-04-06 DIAGNOSIS — I129 Hypertensive chronic kidney disease with stage 1 through stage 4 chronic kidney disease, or unspecified chronic kidney disease: Secondary | ICD-10-CM | POA: Diagnosis not present

## 2020-04-06 DIAGNOSIS — Z944 Liver transplant status: Secondary | ICD-10-CM | POA: Diagnosis not present

## 2020-04-06 DIAGNOSIS — Z949 Transplanted organ and tissue status, unspecified: Secondary | ICD-10-CM | POA: Diagnosis not present

## 2020-04-06 DIAGNOSIS — K432 Incisional hernia without obstruction or gangrene: Secondary | ICD-10-CM | POA: Diagnosis not present

## 2020-06-03 DIAGNOSIS — R69 Illness, unspecified: Secondary | ICD-10-CM | POA: Diagnosis not present

## 2020-06-03 DIAGNOSIS — F32A Depression, unspecified: Secondary | ICD-10-CM | POA: Diagnosis not present

## 2020-06-03 DIAGNOSIS — Z944 Liver transplant status: Secondary | ICD-10-CM | POA: Diagnosis not present

## 2020-06-30 DIAGNOSIS — D899 Disorder involving the immune mechanism, unspecified: Secondary | ICD-10-CM | POA: Diagnosis not present

## 2020-06-30 DIAGNOSIS — Z944 Liver transplant status: Secondary | ICD-10-CM | POA: Diagnosis not present

## 2020-06-30 DIAGNOSIS — E785 Hyperlipidemia, unspecified: Secondary | ICD-10-CM | POA: Diagnosis not present

## 2020-06-30 DIAGNOSIS — Z940 Kidney transplant status: Secondary | ICD-10-CM | POA: Diagnosis not present

## 2020-06-30 DIAGNOSIS — E1122 Type 2 diabetes mellitus with diabetic chronic kidney disease: Secondary | ICD-10-CM | POA: Diagnosis not present

## 2020-06-30 DIAGNOSIS — D849 Immunodeficiency, unspecified: Secondary | ICD-10-CM | POA: Diagnosis not present

## 2020-06-30 DIAGNOSIS — Z298 Encounter for other specified prophylactic measures: Secondary | ICD-10-CM | POA: Diagnosis not present

## 2020-07-04 DIAGNOSIS — E1122 Type 2 diabetes mellitus with diabetic chronic kidney disease: Secondary | ICD-10-CM | POA: Diagnosis not present

## 2020-07-04 DIAGNOSIS — I129 Hypertensive chronic kidney disease with stage 1 through stage 4 chronic kidney disease, or unspecified chronic kidney disease: Secondary | ICD-10-CM | POA: Diagnosis not present

## 2020-07-04 DIAGNOSIS — N184 Chronic kidney disease, stage 4 (severe): Secondary | ICD-10-CM | POA: Diagnosis not present

## 2020-07-20 DIAGNOSIS — Z79899 Other long term (current) drug therapy: Secondary | ICD-10-CM | POA: Diagnosis not present

## 2020-07-20 DIAGNOSIS — Z48288 Encounter for aftercare following multiple organ transplant: Secondary | ICD-10-CM | POA: Diagnosis not present

## 2020-07-20 DIAGNOSIS — R300 Dysuria: Secondary | ICD-10-CM | POA: Diagnosis not present

## 2020-07-20 DIAGNOSIS — E559 Vitamin D deficiency, unspecified: Secondary | ICD-10-CM | POA: Diagnosis not present

## 2020-07-20 DIAGNOSIS — D84821 Immunodeficiency due to drugs: Secondary | ICD-10-CM | POA: Diagnosis not present

## 2020-07-20 DIAGNOSIS — Z940 Kidney transplant status: Secondary | ICD-10-CM | POA: Diagnosis not present

## 2020-07-20 DIAGNOSIS — Z944 Liver transplant status: Secondary | ICD-10-CM | POA: Diagnosis not present

## 2020-07-20 DIAGNOSIS — I10 Essential (primary) hypertension: Secondary | ICD-10-CM | POA: Diagnosis not present

## 2020-07-20 DIAGNOSIS — Z5181 Encounter for therapeutic drug level monitoring: Secondary | ICD-10-CM | POA: Diagnosis not present

## 2020-07-20 DIAGNOSIS — Z4822 Encounter for aftercare following kidney transplant: Secondary | ICD-10-CM | POA: Diagnosis not present

## 2020-07-20 DIAGNOSIS — K219 Gastro-esophageal reflux disease without esophagitis: Secondary | ICD-10-CM | POA: Diagnosis not present

## 2020-07-20 DIAGNOSIS — E669 Obesity, unspecified: Secondary | ICD-10-CM | POA: Diagnosis not present

## 2020-08-03 DIAGNOSIS — R69 Illness, unspecified: Secondary | ICD-10-CM | POA: Diagnosis not present

## 2020-08-03 DIAGNOSIS — I119 Hypertensive heart disease without heart failure: Secondary | ICD-10-CM | POA: Diagnosis not present

## 2020-08-03 DIAGNOSIS — N184 Chronic kidney disease, stage 4 (severe): Secondary | ICD-10-CM | POA: Diagnosis not present

## 2020-08-23 DIAGNOSIS — Z944 Liver transplant status: Secondary | ICD-10-CM | POA: Diagnosis not present

## 2020-08-23 DIAGNOSIS — F419 Anxiety disorder, unspecified: Secondary | ICD-10-CM | POA: Diagnosis not present

## 2020-08-23 DIAGNOSIS — F32A Depression, unspecified: Secondary | ICD-10-CM | POA: Diagnosis not present

## 2020-08-23 DIAGNOSIS — Z636 Dependent relative needing care at home: Secondary | ICD-10-CM | POA: Diagnosis not present

## 2020-09-03 DIAGNOSIS — N184 Chronic kidney disease, stage 4 (severe): Secondary | ICD-10-CM | POA: Diagnosis not present

## 2020-09-03 DIAGNOSIS — I119 Hypertensive heart disease without heart failure: Secondary | ICD-10-CM | POA: Diagnosis not present

## 2020-09-03 DIAGNOSIS — R69 Illness, unspecified: Secondary | ICD-10-CM | POA: Diagnosis not present

## 2020-09-15 DIAGNOSIS — D2270 Melanocytic nevi of unspecified lower limb, including hip: Secondary | ICD-10-CM | POA: Diagnosis not present

## 2020-09-15 DIAGNOSIS — D1801 Hemangioma of skin and subcutaneous tissue: Secondary | ICD-10-CM | POA: Diagnosis not present

## 2020-09-15 DIAGNOSIS — L918 Other hypertrophic disorders of the skin: Secondary | ICD-10-CM | POA: Diagnosis not present

## 2020-09-15 DIAGNOSIS — D2260 Melanocytic nevi of unspecified upper limb, including shoulder: Secondary | ICD-10-CM | POA: Diagnosis not present

## 2020-09-15 DIAGNOSIS — L304 Erythema intertrigo: Secondary | ICD-10-CM | POA: Diagnosis not present

## 2020-09-15 DIAGNOSIS — D849 Immunodeficiency, unspecified: Secondary | ICD-10-CM | POA: Diagnosis not present

## 2020-09-15 DIAGNOSIS — D225 Melanocytic nevi of trunk: Secondary | ICD-10-CM | POA: Diagnosis not present

## 2020-09-15 DIAGNOSIS — L814 Other melanin hyperpigmentation: Secondary | ICD-10-CM | POA: Diagnosis not present

## 2020-09-15 DIAGNOSIS — Q825 Congenital non-neoplastic nevus: Secondary | ICD-10-CM | POA: Diagnosis not present

## 2020-09-16 DIAGNOSIS — B349 Viral infection, unspecified: Secondary | ICD-10-CM | POA: Diagnosis not present

## 2020-09-16 DIAGNOSIS — Z5181 Encounter for therapeutic drug level monitoring: Secondary | ICD-10-CM | POA: Diagnosis not present

## 2020-09-16 DIAGNOSIS — N390 Urinary tract infection, site not specified: Secondary | ICD-10-CM | POA: Diagnosis not present

## 2020-09-16 DIAGNOSIS — Z940 Kidney transplant status: Secondary | ICD-10-CM | POA: Diagnosis not present

## 2020-09-16 DIAGNOSIS — B259 Cytomegaloviral disease, unspecified: Secondary | ICD-10-CM | POA: Diagnosis not present

## 2020-10-18 DIAGNOSIS — B259 Cytomegaloviral disease, unspecified: Secondary | ICD-10-CM | POA: Diagnosis not present

## 2020-10-18 DIAGNOSIS — Z940 Kidney transplant status: Secondary | ICD-10-CM | POA: Diagnosis not present

## 2020-10-18 DIAGNOSIS — E1122 Type 2 diabetes mellitus with diabetic chronic kidney disease: Secondary | ICD-10-CM | POA: Diagnosis not present

## 2020-10-18 DIAGNOSIS — Z9483 Pancreas transplant status: Secondary | ICD-10-CM | POA: Diagnosis not present

## 2020-10-18 DIAGNOSIS — E785 Hyperlipidemia, unspecified: Secondary | ICD-10-CM | POA: Diagnosis not present

## 2020-10-18 DIAGNOSIS — Z7689 Persons encountering health services in other specified circumstances: Secondary | ICD-10-CM | POA: Diagnosis not present

## 2020-10-18 DIAGNOSIS — Z5181 Encounter for therapeutic drug level monitoring: Secondary | ICD-10-CM | POA: Diagnosis not present

## 2020-10-18 DIAGNOSIS — B349 Viral infection, unspecified: Secondary | ICD-10-CM | POA: Diagnosis not present

## 2020-10-21 DIAGNOSIS — R030 Elevated blood-pressure reading, without diagnosis of hypertension: Secondary | ICD-10-CM | POA: Diagnosis not present

## 2020-10-21 DIAGNOSIS — R42 Dizziness and giddiness: Secondary | ICD-10-CM | POA: Diagnosis not present

## 2020-10-28 DIAGNOSIS — B259 Cytomegaloviral disease, unspecified: Secondary | ICD-10-CM | POA: Diagnosis not present

## 2020-10-28 DIAGNOSIS — Z940 Kidney transplant status: Secondary | ICD-10-CM | POA: Diagnosis not present

## 2020-10-28 DIAGNOSIS — Z9483 Pancreas transplant status: Secondary | ICD-10-CM | POA: Diagnosis not present

## 2020-10-28 DIAGNOSIS — Z5181 Encounter for therapeutic drug level monitoring: Secondary | ICD-10-CM | POA: Diagnosis not present

## 2020-10-28 DIAGNOSIS — B349 Viral infection, unspecified: Secondary | ICD-10-CM | POA: Diagnosis not present

## 2020-12-04 DIAGNOSIS — R69 Illness, unspecified: Secondary | ICD-10-CM | POA: Diagnosis not present

## 2020-12-04 DIAGNOSIS — N184 Chronic kidney disease, stage 4 (severe): Secondary | ICD-10-CM | POA: Diagnosis not present

## 2020-12-04 DIAGNOSIS — I119 Hypertensive heart disease without heart failure: Secondary | ICD-10-CM | POA: Diagnosis not present

## 2020-12-13 DIAGNOSIS — Z944 Liver transplant status: Secondary | ICD-10-CM | POA: Diagnosis not present

## 2020-12-13 DIAGNOSIS — F32A Depression, unspecified: Secondary | ICD-10-CM | POA: Diagnosis not present

## 2020-12-13 DIAGNOSIS — R69 Illness, unspecified: Secondary | ICD-10-CM | POA: Diagnosis not present

## 2020-12-20 DIAGNOSIS — Z1231 Encounter for screening mammogram for malignant neoplasm of breast: Secondary | ICD-10-CM | POA: Diagnosis not present

## 2020-12-26 DIAGNOSIS — Z596 Low income: Secondary | ICD-10-CM | POA: Diagnosis not present

## 2020-12-26 DIAGNOSIS — R32 Unspecified urinary incontinence: Secondary | ICD-10-CM | POA: Diagnosis not present

## 2020-12-26 DIAGNOSIS — G629 Polyneuropathy, unspecified: Secondary | ICD-10-CM | POA: Diagnosis not present

## 2020-12-26 DIAGNOSIS — Z944 Liver transplant status: Secondary | ICD-10-CM | POA: Diagnosis not present

## 2020-12-26 DIAGNOSIS — J301 Allergic rhinitis due to pollen: Secondary | ICD-10-CM | POA: Diagnosis not present

## 2020-12-26 DIAGNOSIS — G4700 Insomnia, unspecified: Secondary | ICD-10-CM | POA: Diagnosis not present

## 2020-12-26 DIAGNOSIS — R030 Elevated blood-pressure reading, without diagnosis of hypertension: Secondary | ICD-10-CM | POA: Diagnosis not present

## 2020-12-26 DIAGNOSIS — R69 Illness, unspecified: Secondary | ICD-10-CM | POA: Diagnosis not present

## 2021-01-06 DIAGNOSIS — Z940 Kidney transplant status: Secondary | ICD-10-CM | POA: Diagnosis not present

## 2021-01-06 DIAGNOSIS — E1122 Type 2 diabetes mellitus with diabetic chronic kidney disease: Secondary | ICD-10-CM | POA: Diagnosis not present

## 2021-01-06 DIAGNOSIS — K766 Portal hypertension: Secondary | ICD-10-CM | POA: Diagnosis not present

## 2021-01-06 DIAGNOSIS — I129 Hypertensive chronic kidney disease with stage 1 through stage 4 chronic kidney disease, or unspecified chronic kidney disease: Secondary | ICD-10-CM | POA: Diagnosis not present

## 2021-01-06 DIAGNOSIS — N184 Chronic kidney disease, stage 4 (severe): Secondary | ICD-10-CM | POA: Diagnosis not present

## 2021-01-06 DIAGNOSIS — I5030 Unspecified diastolic (congestive) heart failure: Secondary | ICD-10-CM | POA: Diagnosis not present

## 2021-01-06 DIAGNOSIS — M8580 Other specified disorders of bone density and structure, unspecified site: Secondary | ICD-10-CM | POA: Diagnosis not present

## 2021-01-06 DIAGNOSIS — Z944 Liver transplant status: Secondary | ICD-10-CM | POA: Diagnosis not present

## 2021-01-06 DIAGNOSIS — Z0000 Encounter for general adult medical examination without abnormal findings: Secondary | ICD-10-CM | POA: Diagnosis not present

## 2021-01-06 DIAGNOSIS — K7460 Unspecified cirrhosis of liver: Secondary | ICD-10-CM | POA: Diagnosis not present

## 2021-01-25 DIAGNOSIS — Z9884 Bariatric surgery status: Secondary | ICD-10-CM | POA: Diagnosis not present

## 2021-01-25 DIAGNOSIS — D84821 Immunodeficiency due to drugs: Secondary | ICD-10-CM | POA: Diagnosis not present

## 2021-01-25 DIAGNOSIS — D849 Immunodeficiency, unspecified: Secondary | ICD-10-CM | POA: Diagnosis not present

## 2021-01-25 DIAGNOSIS — Z005 Encounter for examination of potential donor of organ and tissue: Secondary | ICD-10-CM | POA: Diagnosis not present

## 2021-01-25 DIAGNOSIS — R300 Dysuria: Secondary | ICD-10-CM | POA: Diagnosis not present

## 2021-01-25 DIAGNOSIS — Z5181 Encounter for therapeutic drug level monitoring: Secondary | ICD-10-CM | POA: Diagnosis not present

## 2021-01-25 DIAGNOSIS — Z23 Encounter for immunization: Secondary | ICD-10-CM | POA: Diagnosis not present

## 2021-01-25 DIAGNOSIS — Z79899 Other long term (current) drug therapy: Secondary | ICD-10-CM | POA: Diagnosis not present

## 2021-01-25 DIAGNOSIS — E669 Obesity, unspecified: Secondary | ICD-10-CM | POA: Diagnosis not present

## 2021-01-25 DIAGNOSIS — K432 Incisional hernia without obstruction or gangrene: Secondary | ICD-10-CM | POA: Diagnosis not present

## 2021-01-25 DIAGNOSIS — Z940 Kidney transplant status: Secondary | ICD-10-CM | POA: Diagnosis not present

## 2021-01-25 DIAGNOSIS — Z48288 Encounter for aftercare following multiple organ transplant: Secondary | ICD-10-CM | POA: Diagnosis not present

## 2021-01-25 DIAGNOSIS — I10 Essential (primary) hypertension: Secondary | ICD-10-CM | POA: Diagnosis not present

## 2021-01-25 DIAGNOSIS — Z944 Liver transplant status: Secondary | ICD-10-CM | POA: Diagnosis not present

## 2021-01-25 DIAGNOSIS — R69 Illness, unspecified: Secondary | ICD-10-CM | POA: Diagnosis not present

## 2021-02-24 DIAGNOSIS — Z23 Encounter for immunization: Secondary | ICD-10-CM | POA: Diagnosis not present

## 2021-03-02 DIAGNOSIS — Z5181 Encounter for therapeutic drug level monitoring: Secondary | ICD-10-CM | POA: Diagnosis not present

## 2021-03-02 DIAGNOSIS — Z940 Kidney transplant status: Secondary | ICD-10-CM | POA: Diagnosis not present

## 2021-03-02 DIAGNOSIS — B349 Viral infection, unspecified: Secondary | ICD-10-CM | POA: Diagnosis not present

## 2021-03-02 DIAGNOSIS — B259 Cytomegaloviral disease, unspecified: Secondary | ICD-10-CM | POA: Diagnosis not present

## 2021-03-06 DIAGNOSIS — M8580 Other specified disorders of bone density and structure, unspecified site: Secondary | ICD-10-CM | POA: Diagnosis not present

## 2021-03-06 DIAGNOSIS — I5033 Acute on chronic diastolic (congestive) heart failure: Secondary | ICD-10-CM | POA: Diagnosis not present

## 2021-03-06 DIAGNOSIS — E1122 Type 2 diabetes mellitus with diabetic chronic kidney disease: Secondary | ICD-10-CM | POA: Diagnosis not present

## 2021-03-06 DIAGNOSIS — R69 Illness, unspecified: Secondary | ICD-10-CM | POA: Diagnosis not present

## 2021-04-24 DIAGNOSIS — Z940 Kidney transplant status: Secondary | ICD-10-CM | POA: Diagnosis not present

## 2021-04-24 DIAGNOSIS — Z944 Liver transplant status: Secondary | ICD-10-CM | POA: Diagnosis not present

## 2021-04-24 DIAGNOSIS — D849 Immunodeficiency, unspecified: Secondary | ICD-10-CM | POA: Diagnosis not present

## 2021-05-05 DIAGNOSIS — R69 Illness, unspecified: Secondary | ICD-10-CM | POA: Diagnosis not present

## 2021-05-05 DIAGNOSIS — I5030 Unspecified diastolic (congestive) heart failure: Secondary | ICD-10-CM | POA: Diagnosis not present

## 2021-05-05 DIAGNOSIS — E1122 Type 2 diabetes mellitus with diabetic chronic kidney disease: Secondary | ICD-10-CM | POA: Diagnosis not present

## 2021-05-05 DIAGNOSIS — M8580 Other specified disorders of bone density and structure, unspecified site: Secondary | ICD-10-CM | POA: Diagnosis not present

## 2021-05-12 DIAGNOSIS — B259 Cytomegaloviral disease, unspecified: Secondary | ICD-10-CM | POA: Diagnosis not present

## 2021-05-12 DIAGNOSIS — Z5181 Encounter for therapeutic drug level monitoring: Secondary | ICD-10-CM | POA: Diagnosis not present

## 2021-05-12 DIAGNOSIS — B349 Viral infection, unspecified: Secondary | ICD-10-CM | POA: Diagnosis not present

## 2021-05-12 DIAGNOSIS — Z940 Kidney transplant status: Secondary | ICD-10-CM | POA: Diagnosis not present

## 2021-05-25 DIAGNOSIS — F419 Anxiety disorder, unspecified: Secondary | ICD-10-CM | POA: Diagnosis not present

## 2021-05-25 DIAGNOSIS — R69 Illness, unspecified: Secondary | ICD-10-CM | POA: Diagnosis not present

## 2021-05-25 DIAGNOSIS — Z6837 Body mass index (BMI) 37.0-37.9, adult: Secondary | ICD-10-CM | POA: Diagnosis not present

## 2021-05-25 DIAGNOSIS — E669 Obesity, unspecified: Secondary | ICD-10-CM | POA: Diagnosis not present

## 2021-05-25 DIAGNOSIS — Z008 Encounter for other general examination: Secondary | ICD-10-CM | POA: Diagnosis not present

## 2021-05-25 DIAGNOSIS — Z79899 Other long term (current) drug therapy: Secondary | ICD-10-CM | POA: Diagnosis not present

## 2021-05-25 DIAGNOSIS — Z940 Kidney transplant status: Secondary | ICD-10-CM | POA: Diagnosis not present

## 2021-05-25 DIAGNOSIS — Z7722 Contact with and (suspected) exposure to environmental tobacco smoke (acute) (chronic): Secondary | ICD-10-CM | POA: Diagnosis not present

## 2021-05-25 DIAGNOSIS — D84821 Immunodeficiency due to drugs: Secondary | ICD-10-CM | POA: Diagnosis not present

## 2021-05-25 DIAGNOSIS — Z885 Allergy status to narcotic agent status: Secondary | ICD-10-CM | POA: Diagnosis not present

## 2021-05-25 DIAGNOSIS — Z87891 Personal history of nicotine dependence: Secondary | ICD-10-CM | POA: Diagnosis not present

## 2021-05-25 DIAGNOSIS — Z888 Allergy status to other drugs, medicaments and biological substances status: Secondary | ICD-10-CM | POA: Diagnosis not present

## 2021-05-25 DIAGNOSIS — G2581 Restless legs syndrome: Secondary | ICD-10-CM | POA: Diagnosis not present

## 2021-05-26 DIAGNOSIS — F419 Anxiety disorder, unspecified: Secondary | ICD-10-CM | POA: Diagnosis not present

## 2021-05-26 DIAGNOSIS — Z940 Kidney transplant status: Secondary | ICD-10-CM | POA: Diagnosis not present

## 2021-05-26 DIAGNOSIS — R69 Illness, unspecified: Secondary | ICD-10-CM | POA: Diagnosis not present

## 2021-05-26 DIAGNOSIS — Z944 Liver transplant status: Secondary | ICD-10-CM | POA: Diagnosis not present

## 2021-05-26 DIAGNOSIS — F5104 Psychophysiologic insomnia: Secondary | ICD-10-CM | POA: Diagnosis not present

## 2021-05-26 DIAGNOSIS — F32A Depression, unspecified: Secondary | ICD-10-CM | POA: Diagnosis not present

## 2021-06-01 DIAGNOSIS — Z940 Kidney transplant status: Secondary | ICD-10-CM | POA: Diagnosis not present

## 2021-06-01 DIAGNOSIS — Z5181 Encounter for therapeutic drug level monitoring: Secondary | ICD-10-CM | POA: Diagnosis not present

## 2021-06-01 DIAGNOSIS — B259 Cytomegaloviral disease, unspecified: Secondary | ICD-10-CM | POA: Diagnosis not present

## 2021-06-01 DIAGNOSIS — B349 Viral infection, unspecified: Secondary | ICD-10-CM | POA: Diagnosis not present

## 2021-06-02 DIAGNOSIS — Z944 Liver transplant status: Secondary | ICD-10-CM | POA: Diagnosis not present

## 2021-06-02 DIAGNOSIS — K432 Incisional hernia without obstruction or gangrene: Secondary | ICD-10-CM | POA: Diagnosis not present

## 2021-06-02 DIAGNOSIS — K439 Ventral hernia without obstruction or gangrene: Secondary | ICD-10-CM | POA: Diagnosis not present

## 2021-06-08 DIAGNOSIS — K439 Ventral hernia without obstruction or gangrene: Secondary | ICD-10-CM | POA: Diagnosis not present

## 2021-06-08 DIAGNOSIS — Z944 Liver transplant status: Secondary | ICD-10-CM | POA: Diagnosis not present

## 2021-06-08 DIAGNOSIS — K432 Incisional hernia without obstruction or gangrene: Secondary | ICD-10-CM | POA: Diagnosis not present

## 2021-06-20 DIAGNOSIS — K439 Ventral hernia without obstruction or gangrene: Secondary | ICD-10-CM | POA: Diagnosis not present

## 2021-06-20 DIAGNOSIS — Z949 Transplanted organ and tissue status, unspecified: Secondary | ICD-10-CM | POA: Diagnosis not present

## 2021-06-20 DIAGNOSIS — Z6834 Body mass index (BMI) 34.0-34.9, adult: Secondary | ICD-10-CM | POA: Diagnosis not present

## 2021-06-20 DIAGNOSIS — Z940 Kidney transplant status: Secondary | ICD-10-CM | POA: Diagnosis not present

## 2021-06-20 DIAGNOSIS — E669 Obesity, unspecified: Secondary | ICD-10-CM | POA: Diagnosis not present

## 2021-06-22 DIAGNOSIS — B259 Cytomegaloviral disease, unspecified: Secondary | ICD-10-CM | POA: Diagnosis not present

## 2021-06-22 DIAGNOSIS — Z940 Kidney transplant status: Secondary | ICD-10-CM | POA: Diagnosis not present

## 2021-06-22 DIAGNOSIS — Z5181 Encounter for therapeutic drug level monitoring: Secondary | ICD-10-CM | POA: Diagnosis not present

## 2021-06-22 DIAGNOSIS — B349 Viral infection, unspecified: Secondary | ICD-10-CM | POA: Diagnosis not present

## 2021-07-03 DIAGNOSIS — Z944 Liver transplant status: Secondary | ICD-10-CM | POA: Diagnosis not present

## 2021-07-03 DIAGNOSIS — D849 Immunodeficiency, unspecified: Secondary | ICD-10-CM | POA: Diagnosis not present

## 2021-07-04 DIAGNOSIS — N184 Chronic kidney disease, stage 4 (severe): Secondary | ICD-10-CM | POA: Diagnosis not present

## 2021-07-04 DIAGNOSIS — I119 Hypertensive heart disease without heart failure: Secondary | ICD-10-CM | POA: Diagnosis not present

## 2021-07-04 DIAGNOSIS — E1122 Type 2 diabetes mellitus with diabetic chronic kidney disease: Secondary | ICD-10-CM | POA: Diagnosis not present

## 2021-07-14 DIAGNOSIS — J40 Bronchitis, not specified as acute or chronic: Secondary | ICD-10-CM | POA: Diagnosis not present

## 2021-07-14 DIAGNOSIS — J329 Chronic sinusitis, unspecified: Secondary | ICD-10-CM | POA: Diagnosis not present

## 2021-07-14 DIAGNOSIS — N184 Chronic kidney disease, stage 4 (severe): Secondary | ICD-10-CM | POA: Diagnosis not present

## 2021-07-14 DIAGNOSIS — I119 Hypertensive heart disease without heart failure: Secondary | ICD-10-CM | POA: Diagnosis not present

## 2021-07-14 DIAGNOSIS — Z944 Liver transplant status: Secondary | ICD-10-CM | POA: Diagnosis not present

## 2021-07-14 DIAGNOSIS — E1122 Type 2 diabetes mellitus with diabetic chronic kidney disease: Secondary | ICD-10-CM | POA: Diagnosis not present

## 2021-07-14 DIAGNOSIS — I5030 Unspecified diastolic (congestive) heart failure: Secondary | ICD-10-CM | POA: Diagnosis not present

## 2021-07-14 DIAGNOSIS — I129 Hypertensive chronic kidney disease with stage 1 through stage 4 chronic kidney disease, or unspecified chronic kidney disease: Secondary | ICD-10-CM | POA: Diagnosis not present

## 2021-07-14 DIAGNOSIS — K7460 Unspecified cirrhosis of liver: Secondary | ICD-10-CM | POA: Diagnosis not present

## 2021-07-18 DIAGNOSIS — Z79621 Long term (current) use of calcineurin inhibitor: Secondary | ICD-10-CM | POA: Diagnosis not present

## 2021-07-18 DIAGNOSIS — Z7982 Long term (current) use of aspirin: Secondary | ICD-10-CM | POA: Diagnosis not present

## 2021-07-18 DIAGNOSIS — Z7952 Long term (current) use of systemic steroids: Secondary | ICD-10-CM | POA: Diagnosis not present

## 2021-07-18 DIAGNOSIS — Z79899 Other long term (current) drug therapy: Secondary | ICD-10-CM | POA: Diagnosis not present

## 2021-07-18 DIAGNOSIS — K432 Incisional hernia without obstruction or gangrene: Secondary | ICD-10-CM | POA: Diagnosis not present

## 2021-07-18 DIAGNOSIS — Z9049 Acquired absence of other specified parts of digestive tract: Secondary | ICD-10-CM | POA: Diagnosis not present

## 2021-07-18 DIAGNOSIS — Z87891 Personal history of nicotine dependence: Secondary | ICD-10-CM | POA: Diagnosis not present

## 2021-07-19 DIAGNOSIS — H524 Presbyopia: Secondary | ICD-10-CM | POA: Diagnosis not present

## 2021-07-19 DIAGNOSIS — Z961 Presence of intraocular lens: Secondary | ICD-10-CM | POA: Diagnosis not present

## 2021-07-19 DIAGNOSIS — H353130 Nonexudative age-related macular degeneration, bilateral, stage unspecified: Secondary | ICD-10-CM | POA: Diagnosis not present

## 2021-07-19 DIAGNOSIS — H26493 Other secondary cataract, bilateral: Secondary | ICD-10-CM | POA: Diagnosis not present

## 2021-07-19 DIAGNOSIS — H43393 Other vitreous opacities, bilateral: Secondary | ICD-10-CM | POA: Diagnosis not present

## 2021-08-02 DIAGNOSIS — Z23 Encounter for immunization: Secondary | ICD-10-CM | POA: Diagnosis not present

## 2021-08-02 DIAGNOSIS — Z940 Kidney transplant status: Secondary | ICD-10-CM | POA: Diagnosis not present

## 2021-08-02 DIAGNOSIS — D84821 Immunodeficiency due to drugs: Secondary | ICD-10-CM | POA: Diagnosis not present

## 2021-08-02 DIAGNOSIS — Z4822 Encounter for aftercare following kidney transplant: Secondary | ICD-10-CM | POA: Diagnosis not present

## 2021-08-02 DIAGNOSIS — Z87891 Personal history of nicotine dependence: Secondary | ICD-10-CM | POA: Diagnosis not present

## 2021-08-02 DIAGNOSIS — Z944 Liver transplant status: Secondary | ICD-10-CM | POA: Diagnosis not present

## 2021-08-02 DIAGNOSIS — D849 Immunodeficiency, unspecified: Secondary | ICD-10-CM | POA: Diagnosis not present

## 2021-09-01 DIAGNOSIS — F32A Depression, unspecified: Secondary | ICD-10-CM | POA: Diagnosis not present

## 2021-09-01 DIAGNOSIS — F419 Anxiety disorder, unspecified: Secondary | ICD-10-CM | POA: Diagnosis not present

## 2021-09-01 DIAGNOSIS — R69 Illness, unspecified: Secondary | ICD-10-CM | POA: Diagnosis not present

## 2021-09-01 DIAGNOSIS — Z944 Liver transplant status: Secondary | ICD-10-CM | POA: Diagnosis not present

## 2021-09-21 DIAGNOSIS — W57XXXA Bitten or stung by nonvenomous insect and other nonvenomous arthropods, initial encounter: Secondary | ICD-10-CM | POA: Diagnosis not present

## 2021-09-21 DIAGNOSIS — L304 Erythema intertrigo: Secondary | ICD-10-CM | POA: Diagnosis not present

## 2021-09-21 DIAGNOSIS — D2260 Melanocytic nevi of unspecified upper limb, including shoulder: Secondary | ICD-10-CM | POA: Diagnosis not present

## 2021-09-21 DIAGNOSIS — D849 Immunodeficiency, unspecified: Secondary | ICD-10-CM | POA: Diagnosis not present

## 2021-09-21 DIAGNOSIS — D2270 Melanocytic nevi of unspecified lower limb, including hip: Secondary | ICD-10-CM | POA: Diagnosis not present

## 2021-09-21 DIAGNOSIS — D225 Melanocytic nevi of trunk: Secondary | ICD-10-CM | POA: Diagnosis not present

## 2021-09-21 DIAGNOSIS — D1801 Hemangioma of skin and subcutaneous tissue: Secondary | ICD-10-CM | POA: Diagnosis not present

## 2021-09-21 DIAGNOSIS — L814 Other melanin hyperpigmentation: Secondary | ICD-10-CM | POA: Diagnosis not present

## 2021-09-21 DIAGNOSIS — L918 Other hypertrophic disorders of the skin: Secondary | ICD-10-CM | POA: Diagnosis not present

## 2021-11-03 DIAGNOSIS — N3001 Acute cystitis with hematuria: Secondary | ICD-10-CM | POA: Diagnosis not present

## 2021-11-03 DIAGNOSIS — N3090 Cystitis, unspecified without hematuria: Secondary | ICD-10-CM | POA: Diagnosis not present

## 2021-11-24 DIAGNOSIS — Z944 Liver transplant status: Secondary | ICD-10-CM | POA: Diagnosis not present

## 2021-11-24 DIAGNOSIS — F32A Depression, unspecified: Secondary | ICD-10-CM | POA: Diagnosis not present

## 2021-11-24 DIAGNOSIS — F419 Anxiety disorder, unspecified: Secondary | ICD-10-CM | POA: Diagnosis not present

## 2021-11-24 DIAGNOSIS — F4321 Adjustment disorder with depressed mood: Secondary | ICD-10-CM | POA: Diagnosis not present

## 2021-11-24 DIAGNOSIS — R69 Illness, unspecified: Secondary | ICD-10-CM | POA: Diagnosis not present

## 2021-11-29 DIAGNOSIS — N390 Urinary tract infection, site not specified: Secondary | ICD-10-CM | POA: Diagnosis not present

## 2021-11-29 DIAGNOSIS — Z940 Kidney transplant status: Secondary | ICD-10-CM | POA: Diagnosis not present

## 2021-12-14 ENCOUNTER — Other Ambulatory Visit: Payer: Self-pay | Admitting: *Deleted

## 2021-12-14 NOTE — Patient Outreach (Signed)
Nurse opened chart in error.

## 2021-12-26 DIAGNOSIS — N390 Urinary tract infection, site not specified: Secondary | ICD-10-CM | POA: Diagnosis not present

## 2021-12-26 DIAGNOSIS — Z940 Kidney transplant status: Secondary | ICD-10-CM | POA: Diagnosis not present

## 2022-01-02 DIAGNOSIS — B1920 Unspecified viral hepatitis C without hepatic coma: Secondary | ICD-10-CM | POA: Diagnosis not present

## 2022-01-02 DIAGNOSIS — Z940 Kidney transplant status: Secondary | ICD-10-CM | POA: Diagnosis not present

## 2022-01-02 DIAGNOSIS — Z944 Liver transplant status: Secondary | ICD-10-CM | POA: Diagnosis not present

## 2022-01-02 DIAGNOSIS — R69 Illness, unspecified: Secondary | ICD-10-CM | POA: Diagnosis not present

## 2022-01-02 DIAGNOSIS — Z5181 Encounter for therapeutic drug level monitoring: Secondary | ICD-10-CM | POA: Diagnosis not present

## 2022-01-02 DIAGNOSIS — T8643 Liver transplant infection: Secondary | ICD-10-CM | POA: Diagnosis not present

## 2022-01-11 DIAGNOSIS — D631 Anemia in chronic kidney disease: Secondary | ICD-10-CM | POA: Diagnosis not present

## 2022-01-11 DIAGNOSIS — G720 Drug-induced myopathy: Secondary | ICD-10-CM | POA: Diagnosis not present

## 2022-01-11 DIAGNOSIS — K7460 Unspecified cirrhosis of liver: Secondary | ICD-10-CM | POA: Diagnosis not present

## 2022-01-11 DIAGNOSIS — I5030 Unspecified diastolic (congestive) heart failure: Secondary | ICD-10-CM | POA: Diagnosis not present

## 2022-01-11 DIAGNOSIS — T466X5A Adverse effect of antihyperlipidemic and antiarteriosclerotic drugs, initial encounter: Secondary | ICD-10-CM | POA: Diagnosis not present

## 2022-01-11 DIAGNOSIS — N184 Chronic kidney disease, stage 4 (severe): Secondary | ICD-10-CM | POA: Diagnosis not present

## 2022-01-11 DIAGNOSIS — M1991 Primary osteoarthritis, unspecified site: Secondary | ICD-10-CM | POA: Diagnosis not present

## 2022-01-11 DIAGNOSIS — E785 Hyperlipidemia, unspecified: Secondary | ICD-10-CM | POA: Diagnosis not present

## 2022-01-11 DIAGNOSIS — Z0000 Encounter for general adult medical examination without abnormal findings: Secondary | ICD-10-CM | POA: Diagnosis not present

## 2022-01-11 DIAGNOSIS — I119 Hypertensive heart disease without heart failure: Secondary | ICD-10-CM | POA: Diagnosis not present

## 2022-01-11 DIAGNOSIS — I129 Hypertensive chronic kidney disease with stage 1 through stage 4 chronic kidney disease, or unspecified chronic kidney disease: Secondary | ICD-10-CM | POA: Diagnosis not present

## 2022-01-11 DIAGNOSIS — E1122 Type 2 diabetes mellitus with diabetic chronic kidney disease: Secondary | ICD-10-CM | POA: Diagnosis not present

## 2022-02-02 DIAGNOSIS — Z944 Liver transplant status: Secondary | ICD-10-CM | POA: Diagnosis not present

## 2022-02-02 DIAGNOSIS — N390 Urinary tract infection, site not specified: Secondary | ICD-10-CM | POA: Diagnosis not present

## 2022-02-02 DIAGNOSIS — Z940 Kidney transplant status: Secondary | ICD-10-CM | POA: Diagnosis not present

## 2022-02-02 DIAGNOSIS — B259 Cytomegaloviral disease, unspecified: Secondary | ICD-10-CM | POA: Diagnosis not present

## 2022-02-02 DIAGNOSIS — Z5181 Encounter for therapeutic drug level monitoring: Secondary | ICD-10-CM | POA: Diagnosis not present

## 2022-02-02 DIAGNOSIS — B349 Viral infection, unspecified: Secondary | ICD-10-CM | POA: Diagnosis not present

## 2022-02-14 DIAGNOSIS — Z940 Kidney transplant status: Secondary | ICD-10-CM | POA: Diagnosis not present

## 2022-02-14 DIAGNOSIS — Z944 Liver transplant status: Secondary | ICD-10-CM | POA: Diagnosis not present

## 2022-02-14 DIAGNOSIS — D849 Immunodeficiency, unspecified: Secondary | ICD-10-CM | POA: Diagnosis not present

## 2022-02-14 DIAGNOSIS — R300 Dysuria: Secondary | ICD-10-CM | POA: Diagnosis not present

## 2022-02-19 DIAGNOSIS — G2581 Restless legs syndrome: Secondary | ICD-10-CM | POA: Diagnosis not present

## 2022-02-19 DIAGNOSIS — Z9889 Other specified postprocedural states: Secondary | ICD-10-CM | POA: Diagnosis not present

## 2022-02-19 DIAGNOSIS — G4733 Obstructive sleep apnea (adult) (pediatric): Secondary | ICD-10-CM | POA: Diagnosis not present

## 2022-02-19 DIAGNOSIS — I129 Hypertensive chronic kidney disease with stage 1 through stage 4 chronic kidney disease, or unspecified chronic kidney disease: Secondary | ICD-10-CM | POA: Diagnosis not present

## 2022-02-19 DIAGNOSIS — Z9884 Bariatric surgery status: Secondary | ICD-10-CM | POA: Diagnosis not present

## 2022-02-19 DIAGNOSIS — N1831 Chronic kidney disease, stage 3a: Secondary | ICD-10-CM | POA: Diagnosis not present

## 2022-02-19 DIAGNOSIS — E669 Obesity, unspecified: Secondary | ICD-10-CM | POA: Diagnosis not present

## 2022-02-19 DIAGNOSIS — B1910 Unspecified viral hepatitis B without hepatic coma: Secondary | ICD-10-CM | POA: Diagnosis not present

## 2022-02-19 DIAGNOSIS — Z9049 Acquired absence of other specified parts of digestive tract: Secondary | ICD-10-CM | POA: Diagnosis not present

## 2022-02-19 DIAGNOSIS — I5030 Unspecified diastolic (congestive) heart failure: Secondary | ICD-10-CM | POA: Diagnosis not present

## 2022-02-19 DIAGNOSIS — K219 Gastro-esophageal reflux disease without esophagitis: Secondary | ICD-10-CM | POA: Diagnosis not present

## 2022-02-19 DIAGNOSIS — J449 Chronic obstructive pulmonary disease, unspecified: Secondary | ICD-10-CM | POA: Diagnosis not present

## 2022-02-19 DIAGNOSIS — Z79899 Other long term (current) drug therapy: Secondary | ICD-10-CM | POA: Diagnosis not present

## 2022-02-19 DIAGNOSIS — T8643 Liver transplant infection: Secondary | ICD-10-CM | POA: Diagnosis not present

## 2022-02-19 DIAGNOSIS — Z885 Allergy status to narcotic agent status: Secondary | ICD-10-CM | POA: Diagnosis not present

## 2022-02-19 DIAGNOSIS — Y929 Unspecified place or not applicable: Secondary | ICD-10-CM | POA: Diagnosis not present

## 2022-02-19 DIAGNOSIS — M1990 Unspecified osteoarthritis, unspecified site: Secondary | ICD-10-CM | POA: Diagnosis not present

## 2022-02-19 DIAGNOSIS — Z9841 Cataract extraction status, right eye: Secondary | ICD-10-CM | POA: Diagnosis not present

## 2022-02-19 DIAGNOSIS — D62 Acute posthemorrhagic anemia: Secondary | ICD-10-CM | POA: Diagnosis not present

## 2022-02-19 DIAGNOSIS — Z940 Kidney transplant status: Secondary | ICD-10-CM | POA: Diagnosis not present

## 2022-02-19 DIAGNOSIS — T380X5A Adverse effect of glucocorticoids and synthetic analogues, initial encounter: Secondary | ICD-10-CM | POA: Diagnosis not present

## 2022-02-19 DIAGNOSIS — I110 Hypertensive heart disease with heart failure: Secondary | ICD-10-CM | POA: Diagnosis not present

## 2022-02-19 DIAGNOSIS — K432 Incisional hernia without obstruction or gangrene: Secondary | ICD-10-CM | POA: Diagnosis not present

## 2022-02-19 DIAGNOSIS — Z881 Allergy status to other antibiotic agents status: Secondary | ICD-10-CM | POA: Diagnosis not present

## 2022-02-19 DIAGNOSIS — R739 Hyperglycemia, unspecified: Secondary | ICD-10-CM | POA: Diagnosis not present

## 2022-02-19 DIAGNOSIS — Z888 Allergy status to other drugs, medicaments and biological substances status: Secondary | ICD-10-CM | POA: Diagnosis not present

## 2022-02-19 DIAGNOSIS — Z944 Liver transplant status: Secondary | ICD-10-CM | POA: Diagnosis not present

## 2022-02-19 DIAGNOSIS — T8619 Other complication of kidney transplant: Secondary | ICD-10-CM | POA: Diagnosis not present

## 2022-02-19 DIAGNOSIS — Z8719 Personal history of other diseases of the digestive system: Secondary | ICD-10-CM | POA: Diagnosis not present

## 2022-02-20 DIAGNOSIS — Z885 Allergy status to narcotic agent status: Secondary | ICD-10-CM | POA: Diagnosis not present

## 2022-02-20 DIAGNOSIS — B1910 Unspecified viral hepatitis B without hepatic coma: Secondary | ICD-10-CM | POA: Diagnosis not present

## 2022-02-20 DIAGNOSIS — G2581 Restless legs syndrome: Secondary | ICD-10-CM | POA: Diagnosis not present

## 2022-02-20 DIAGNOSIS — K219 Gastro-esophageal reflux disease without esophagitis: Secondary | ICD-10-CM | POA: Diagnosis not present

## 2022-02-20 DIAGNOSIS — Z8719 Personal history of other diseases of the digestive system: Secondary | ICD-10-CM | POA: Diagnosis not present

## 2022-02-20 DIAGNOSIS — Y929 Unspecified place or not applicable: Secondary | ICD-10-CM | POA: Diagnosis not present

## 2022-02-20 DIAGNOSIS — T8619 Other complication of kidney transplant: Secondary | ICD-10-CM | POA: Diagnosis not present

## 2022-02-20 DIAGNOSIS — I129 Hypertensive chronic kidney disease with stage 1 through stage 4 chronic kidney disease, or unspecified chronic kidney disease: Secondary | ICD-10-CM | POA: Diagnosis not present

## 2022-02-20 DIAGNOSIS — M1990 Unspecified osteoarthritis, unspecified site: Secondary | ICD-10-CM | POA: Diagnosis not present

## 2022-02-20 DIAGNOSIS — T8643 Liver transplant infection: Secondary | ICD-10-CM | POA: Diagnosis not present

## 2022-02-20 DIAGNOSIS — E669 Obesity, unspecified: Secondary | ICD-10-CM | POA: Diagnosis not present

## 2022-02-20 DIAGNOSIS — Z888 Allergy status to other drugs, medicaments and biological substances status: Secondary | ICD-10-CM | POA: Diagnosis not present

## 2022-02-20 DIAGNOSIS — K432 Incisional hernia without obstruction or gangrene: Secondary | ICD-10-CM | POA: Diagnosis not present

## 2022-02-20 DIAGNOSIS — Z9889 Other specified postprocedural states: Secondary | ICD-10-CM | POA: Diagnosis not present

## 2022-02-20 DIAGNOSIS — Z940 Kidney transplant status: Secondary | ICD-10-CM | POA: Diagnosis not present

## 2022-02-20 DIAGNOSIS — Z9841 Cataract extraction status, right eye: Secondary | ICD-10-CM | POA: Diagnosis not present

## 2022-02-20 DIAGNOSIS — Z944 Liver transplant status: Secondary | ICD-10-CM | POA: Diagnosis not present

## 2022-02-20 DIAGNOSIS — J449 Chronic obstructive pulmonary disease, unspecified: Secondary | ICD-10-CM | POA: Diagnosis not present

## 2022-02-20 DIAGNOSIS — R739 Hyperglycemia, unspecified: Secondary | ICD-10-CM | POA: Diagnosis not present

## 2022-02-20 DIAGNOSIS — Z9884 Bariatric surgery status: Secondary | ICD-10-CM | POA: Diagnosis not present

## 2022-02-20 DIAGNOSIS — T380X5A Adverse effect of glucocorticoids and synthetic analogues, initial encounter: Secondary | ICD-10-CM | POA: Diagnosis not present

## 2022-02-20 DIAGNOSIS — G4733 Obstructive sleep apnea (adult) (pediatric): Secondary | ICD-10-CM | POA: Diagnosis not present

## 2022-02-20 DIAGNOSIS — I110 Hypertensive heart disease with heart failure: Secondary | ICD-10-CM | POA: Diagnosis not present

## 2022-02-20 DIAGNOSIS — D62 Acute posthemorrhagic anemia: Secondary | ICD-10-CM | POA: Diagnosis not present

## 2022-02-20 DIAGNOSIS — Z79899 Other long term (current) drug therapy: Secondary | ICD-10-CM | POA: Diagnosis not present

## 2022-02-20 DIAGNOSIS — Z881 Allergy status to other antibiotic agents status: Secondary | ICD-10-CM | POA: Diagnosis not present

## 2022-02-20 DIAGNOSIS — I5030 Unspecified diastolic (congestive) heart failure: Secondary | ICD-10-CM | POA: Diagnosis not present

## 2022-02-20 DIAGNOSIS — N1831 Chronic kidney disease, stage 3a: Secondary | ICD-10-CM | POA: Diagnosis not present

## 2022-02-20 DIAGNOSIS — Z9049 Acquired absence of other specified parts of digestive tract: Secondary | ICD-10-CM | POA: Diagnosis not present

## 2022-02-21 DIAGNOSIS — Z940 Kidney transplant status: Secondary | ICD-10-CM | POA: Diagnosis not present

## 2022-02-21 DIAGNOSIS — B1910 Unspecified viral hepatitis B without hepatic coma: Secondary | ICD-10-CM | POA: Diagnosis not present

## 2022-02-21 DIAGNOSIS — Z9841 Cataract extraction status, right eye: Secondary | ICD-10-CM | POA: Diagnosis not present

## 2022-02-21 DIAGNOSIS — Z8719 Personal history of other diseases of the digestive system: Secondary | ICD-10-CM | POA: Diagnosis not present

## 2022-02-21 DIAGNOSIS — D62 Acute posthemorrhagic anemia: Secondary | ICD-10-CM | POA: Diagnosis not present

## 2022-02-21 DIAGNOSIS — K432 Incisional hernia without obstruction or gangrene: Secondary | ICD-10-CM | POA: Diagnosis not present

## 2022-02-21 DIAGNOSIS — G2581 Restless legs syndrome: Secondary | ICD-10-CM | POA: Diagnosis not present

## 2022-02-21 DIAGNOSIS — Z9049 Acquired absence of other specified parts of digestive tract: Secondary | ICD-10-CM | POA: Diagnosis not present

## 2022-02-21 DIAGNOSIS — G4733 Obstructive sleep apnea (adult) (pediatric): Secondary | ICD-10-CM | POA: Diagnosis not present

## 2022-02-21 DIAGNOSIS — Y929 Unspecified place or not applicable: Secondary | ICD-10-CM | POA: Diagnosis not present

## 2022-02-21 DIAGNOSIS — K219 Gastro-esophageal reflux disease without esophagitis: Secondary | ICD-10-CM | POA: Diagnosis not present

## 2022-02-21 DIAGNOSIS — R739 Hyperglycemia, unspecified: Secondary | ICD-10-CM | POA: Diagnosis not present

## 2022-02-21 DIAGNOSIS — Z9884 Bariatric surgery status: Secondary | ICD-10-CM | POA: Diagnosis not present

## 2022-02-21 DIAGNOSIS — I5030 Unspecified diastolic (congestive) heart failure: Secondary | ICD-10-CM | POA: Diagnosis not present

## 2022-02-21 DIAGNOSIS — Z885 Allergy status to narcotic agent status: Secondary | ICD-10-CM | POA: Diagnosis not present

## 2022-02-21 DIAGNOSIS — Z881 Allergy status to other antibiotic agents status: Secondary | ICD-10-CM | POA: Diagnosis not present

## 2022-02-21 DIAGNOSIS — Z79899 Other long term (current) drug therapy: Secondary | ICD-10-CM | POA: Diagnosis not present

## 2022-02-21 DIAGNOSIS — E669 Obesity, unspecified: Secondary | ICD-10-CM | POA: Diagnosis not present

## 2022-02-21 DIAGNOSIS — N1831 Chronic kidney disease, stage 3a: Secondary | ICD-10-CM | POA: Diagnosis not present

## 2022-02-21 DIAGNOSIS — M1990 Unspecified osteoarthritis, unspecified site: Secondary | ICD-10-CM | POA: Diagnosis not present

## 2022-02-21 DIAGNOSIS — T380X5A Adverse effect of glucocorticoids and synthetic analogues, initial encounter: Secondary | ICD-10-CM | POA: Diagnosis not present

## 2022-02-21 DIAGNOSIS — Z888 Allergy status to other drugs, medicaments and biological substances status: Secondary | ICD-10-CM | POA: Diagnosis not present

## 2022-02-21 DIAGNOSIS — I110 Hypertensive heart disease with heart failure: Secondary | ICD-10-CM | POA: Diagnosis not present

## 2022-02-21 DIAGNOSIS — T8619 Other complication of kidney transplant: Secondary | ICD-10-CM | POA: Diagnosis not present

## 2022-02-21 DIAGNOSIS — J449 Chronic obstructive pulmonary disease, unspecified: Secondary | ICD-10-CM | POA: Diagnosis not present

## 2022-02-21 DIAGNOSIS — T8643 Liver transplant infection: Secondary | ICD-10-CM | POA: Diagnosis not present

## 2022-02-21 DIAGNOSIS — Z944 Liver transplant status: Secondary | ICD-10-CM | POA: Diagnosis not present

## 2022-02-21 DIAGNOSIS — Z9889 Other specified postprocedural states: Secondary | ICD-10-CM | POA: Diagnosis not present

## 2022-02-21 DIAGNOSIS — I129 Hypertensive chronic kidney disease with stage 1 through stage 4 chronic kidney disease, or unspecified chronic kidney disease: Secondary | ICD-10-CM | POA: Diagnosis not present

## 2022-02-22 DIAGNOSIS — E669 Obesity, unspecified: Secondary | ICD-10-CM | POA: Diagnosis not present

## 2022-02-22 DIAGNOSIS — T8643 Liver transplant infection: Secondary | ICD-10-CM | POA: Diagnosis not present

## 2022-02-22 DIAGNOSIS — M1990 Unspecified osteoarthritis, unspecified site: Secondary | ICD-10-CM | POA: Diagnosis not present

## 2022-02-22 DIAGNOSIS — B1910 Unspecified viral hepatitis B without hepatic coma: Secondary | ICD-10-CM | POA: Diagnosis not present

## 2022-02-22 DIAGNOSIS — Z9884 Bariatric surgery status: Secondary | ICD-10-CM | POA: Diagnosis not present

## 2022-02-22 DIAGNOSIS — N1831 Chronic kidney disease, stage 3a: Secondary | ICD-10-CM | POA: Diagnosis not present

## 2022-02-22 DIAGNOSIS — G2581 Restless legs syndrome: Secondary | ICD-10-CM | POA: Diagnosis not present

## 2022-02-22 DIAGNOSIS — R739 Hyperglycemia, unspecified: Secondary | ICD-10-CM | POA: Diagnosis not present

## 2022-02-22 DIAGNOSIS — Y929 Unspecified place or not applicable: Secondary | ICD-10-CM | POA: Diagnosis not present

## 2022-02-22 DIAGNOSIS — Z79899 Other long term (current) drug therapy: Secondary | ICD-10-CM | POA: Diagnosis not present

## 2022-02-22 DIAGNOSIS — K219 Gastro-esophageal reflux disease without esophagitis: Secondary | ICD-10-CM | POA: Diagnosis not present

## 2022-02-22 DIAGNOSIS — T8619 Other complication of kidney transplant: Secondary | ICD-10-CM | POA: Diagnosis not present

## 2022-02-22 DIAGNOSIS — T380X5A Adverse effect of glucocorticoids and synthetic analogues, initial encounter: Secondary | ICD-10-CM | POA: Diagnosis not present

## 2022-02-22 DIAGNOSIS — Z885 Allergy status to narcotic agent status: Secondary | ICD-10-CM | POA: Diagnosis not present

## 2022-02-22 DIAGNOSIS — I129 Hypertensive chronic kidney disease with stage 1 through stage 4 chronic kidney disease, or unspecified chronic kidney disease: Secondary | ICD-10-CM | POA: Diagnosis not present

## 2022-02-22 DIAGNOSIS — K432 Incisional hernia without obstruction or gangrene: Secondary | ICD-10-CM | POA: Diagnosis not present

## 2022-02-22 DIAGNOSIS — Z940 Kidney transplant status: Secondary | ICD-10-CM | POA: Diagnosis not present

## 2022-02-22 DIAGNOSIS — Z888 Allergy status to other drugs, medicaments and biological substances status: Secondary | ICD-10-CM | POA: Diagnosis not present

## 2022-02-22 DIAGNOSIS — Z944 Liver transplant status: Secondary | ICD-10-CM | POA: Diagnosis not present

## 2022-02-22 DIAGNOSIS — G4733 Obstructive sleep apnea (adult) (pediatric): Secondary | ICD-10-CM | POA: Diagnosis not present

## 2022-02-22 DIAGNOSIS — I5030 Unspecified diastolic (congestive) heart failure: Secondary | ICD-10-CM | POA: Diagnosis not present

## 2022-02-22 DIAGNOSIS — Z881 Allergy status to other antibiotic agents status: Secondary | ICD-10-CM | POA: Diagnosis not present

## 2022-02-22 DIAGNOSIS — I110 Hypertensive heart disease with heart failure: Secondary | ICD-10-CM | POA: Diagnosis not present

## 2022-02-22 DIAGNOSIS — Z9841 Cataract extraction status, right eye: Secondary | ICD-10-CM | POA: Diagnosis not present

## 2022-02-22 DIAGNOSIS — J449 Chronic obstructive pulmonary disease, unspecified: Secondary | ICD-10-CM | POA: Diagnosis not present

## 2022-02-22 DIAGNOSIS — Z8719 Personal history of other diseases of the digestive system: Secondary | ICD-10-CM | POA: Diagnosis not present

## 2022-02-22 DIAGNOSIS — Z9889 Other specified postprocedural states: Secondary | ICD-10-CM | POA: Diagnosis not present

## 2022-02-22 DIAGNOSIS — D62 Acute posthemorrhagic anemia: Secondary | ICD-10-CM | POA: Diagnosis not present

## 2022-02-22 DIAGNOSIS — Z9049 Acquired absence of other specified parts of digestive tract: Secondary | ICD-10-CM | POA: Diagnosis not present

## 2022-03-06 DIAGNOSIS — K439 Ventral hernia without obstruction or gangrene: Secondary | ICD-10-CM | POA: Diagnosis not present

## 2022-03-06 DIAGNOSIS — Z8719 Personal history of other diseases of the digestive system: Secondary | ICD-10-CM | POA: Diagnosis not present

## 2022-03-06 DIAGNOSIS — Z48815 Encounter for surgical aftercare following surgery on the digestive system: Secondary | ICD-10-CM | POA: Diagnosis not present

## 2022-03-08 DIAGNOSIS — E1122 Type 2 diabetes mellitus with diabetic chronic kidney disease: Secondary | ICD-10-CM | POA: Diagnosis not present

## 2022-03-08 DIAGNOSIS — J40 Bronchitis, not specified as acute or chronic: Secondary | ICD-10-CM | POA: Diagnosis not present

## 2022-03-08 DIAGNOSIS — J329 Chronic sinusitis, unspecified: Secondary | ICD-10-CM | POA: Diagnosis not present

## 2022-03-08 DIAGNOSIS — I129 Hypertensive chronic kidney disease with stage 1 through stage 4 chronic kidney disease, or unspecified chronic kidney disease: Secondary | ICD-10-CM | POA: Diagnosis not present

## 2022-03-08 DIAGNOSIS — N184 Chronic kidney disease, stage 4 (severe): Secondary | ICD-10-CM | POA: Diagnosis not present

## 2022-03-09 DIAGNOSIS — F32A Depression, unspecified: Secondary | ICD-10-CM | POA: Diagnosis not present

## 2022-03-09 DIAGNOSIS — R69 Illness, unspecified: Secondary | ICD-10-CM | POA: Diagnosis not present

## 2022-03-09 DIAGNOSIS — F419 Anxiety disorder, unspecified: Secondary | ICD-10-CM | POA: Diagnosis not present

## 2022-03-09 DIAGNOSIS — Z944 Liver transplant status: Secondary | ICD-10-CM | POA: Diagnosis not present

## 2022-03-09 DIAGNOSIS — Z940 Kidney transplant status: Secondary | ICD-10-CM | POA: Diagnosis not present

## 2022-03-15 DIAGNOSIS — N179 Acute kidney failure, unspecified: Secondary | ICD-10-CM | POA: Diagnosis not present

## 2022-03-15 DIAGNOSIS — Z940 Kidney transplant status: Secondary | ICD-10-CM | POA: Diagnosis not present

## 2022-03-16 DIAGNOSIS — R509 Fever, unspecified: Secondary | ICD-10-CM | POA: Diagnosis not present

## 2022-03-16 DIAGNOSIS — I110 Hypertensive heart disease with heart failure: Secondary | ICD-10-CM | POA: Diagnosis not present

## 2022-03-16 DIAGNOSIS — Z940 Kidney transplant status: Secondary | ICD-10-CM | POA: Diagnosis not present

## 2022-03-16 DIAGNOSIS — Z87891 Personal history of nicotine dependence: Secondary | ICD-10-CM | POA: Diagnosis not present

## 2022-03-16 DIAGNOSIS — I5032 Chronic diastolic (congestive) heart failure: Secondary | ICD-10-CM | POA: Diagnosis not present

## 2022-03-16 DIAGNOSIS — I700 Atherosclerosis of aorta: Secondary | ICD-10-CM | POA: Diagnosis not present

## 2022-03-16 DIAGNOSIS — Z944 Liver transplant status: Secondary | ICD-10-CM | POA: Diagnosis not present

## 2022-03-16 DIAGNOSIS — R1012 Left upper quadrant pain: Secondary | ICD-10-CM | POA: Diagnosis not present

## 2022-03-16 DIAGNOSIS — L03311 Cellulitis of abdominal wall: Secondary | ICD-10-CM | POA: Diagnosis not present

## 2022-03-17 DIAGNOSIS — I110 Hypertensive heart disease with heart failure: Secondary | ICD-10-CM | POA: Diagnosis not present

## 2022-03-17 DIAGNOSIS — Z9884 Bariatric surgery status: Secondary | ICD-10-CM | POA: Diagnosis not present

## 2022-03-17 DIAGNOSIS — Z7985 Long-term (current) use of injectable non-insulin antidiabetic drugs: Secondary | ICD-10-CM | POA: Diagnosis not present

## 2022-03-17 DIAGNOSIS — G2581 Restless legs syndrome: Secondary | ICD-10-CM | POA: Diagnosis not present

## 2022-03-17 DIAGNOSIS — L03311 Cellulitis of abdominal wall: Secondary | ICD-10-CM | POA: Diagnosis not present

## 2022-03-17 DIAGNOSIS — D509 Iron deficiency anemia, unspecified: Secondary | ICD-10-CM | POA: Diagnosis not present

## 2022-03-17 DIAGNOSIS — K219 Gastro-esophageal reflux disease without esophagitis: Secondary | ICD-10-CM | POA: Diagnosis not present

## 2022-03-17 DIAGNOSIS — Z944 Liver transplant status: Secondary | ICD-10-CM | POA: Diagnosis not present

## 2022-03-17 DIAGNOSIS — I5032 Chronic diastolic (congestive) heart failure: Secondary | ICD-10-CM | POA: Diagnosis not present

## 2022-03-17 DIAGNOSIS — F32A Depression, unspecified: Secondary | ICD-10-CM | POA: Diagnosis not present

## 2022-03-17 DIAGNOSIS — D84821 Immunodeficiency due to drugs: Secondary | ICD-10-CM | POA: Diagnosis not present

## 2022-03-17 DIAGNOSIS — Z79899 Other long term (current) drug therapy: Secondary | ICD-10-CM | POA: Diagnosis not present

## 2022-03-17 DIAGNOSIS — Z7960 Long term (current) use of unspecified immunomodulators and immunosuppressants: Secondary | ICD-10-CM | POA: Diagnosis not present

## 2022-03-17 DIAGNOSIS — Z87891 Personal history of nicotine dependence: Secondary | ICD-10-CM | POA: Diagnosis not present

## 2022-03-17 DIAGNOSIS — Z9049 Acquired absence of other specified parts of digestive tract: Secondary | ICD-10-CM | POA: Diagnosis not present

## 2022-03-17 DIAGNOSIS — R69 Illness, unspecified: Secondary | ICD-10-CM | POA: Diagnosis not present

## 2022-03-17 DIAGNOSIS — D649 Anemia, unspecified: Secondary | ICD-10-CM | POA: Diagnosis not present

## 2022-03-17 DIAGNOSIS — Z940 Kidney transplant status: Secondary | ICD-10-CM | POA: Diagnosis not present

## 2022-03-19 DIAGNOSIS — R509 Fever, unspecified: Secondary | ICD-10-CM | POA: Diagnosis not present

## 2022-03-19 DIAGNOSIS — R1012 Left upper quadrant pain: Secondary | ICD-10-CM | POA: Diagnosis not present

## 2022-03-19 DIAGNOSIS — I110 Hypertensive heart disease with heart failure: Secondary | ICD-10-CM | POA: Diagnosis not present

## 2022-03-19 DIAGNOSIS — D509 Iron deficiency anemia, unspecified: Secondary | ICD-10-CM | POA: Diagnosis not present

## 2022-03-19 DIAGNOSIS — Z940 Kidney transplant status: Secondary | ICD-10-CM | POA: Diagnosis not present

## 2022-03-19 DIAGNOSIS — L03311 Cellulitis of abdominal wall: Secondary | ICD-10-CM | POA: Diagnosis not present

## 2022-03-19 DIAGNOSIS — R69 Illness, unspecified: Secondary | ICD-10-CM | POA: Diagnosis not present

## 2022-03-19 DIAGNOSIS — Z7960 Long term (current) use of unspecified immunomodulators and immunosuppressants: Secondary | ICD-10-CM | POA: Diagnosis not present

## 2022-03-19 DIAGNOSIS — G2581 Restless legs syndrome: Secondary | ICD-10-CM | POA: Diagnosis not present

## 2022-03-19 DIAGNOSIS — Z944 Liver transplant status: Secondary | ICD-10-CM | POA: Diagnosis not present

## 2022-03-19 DIAGNOSIS — K769 Liver disease, unspecified: Secondary | ICD-10-CM | POA: Diagnosis not present

## 2022-03-19 DIAGNOSIS — I5030 Unspecified diastolic (congestive) heart failure: Secondary | ICD-10-CM | POA: Diagnosis not present

## 2022-04-02 DIAGNOSIS — N390 Urinary tract infection, site not specified: Secondary | ICD-10-CM | POA: Diagnosis not present

## 2022-04-02 DIAGNOSIS — B1920 Unspecified viral hepatitis C without hepatic coma: Secondary | ICD-10-CM | POA: Diagnosis not present

## 2022-04-02 DIAGNOSIS — B259 Cytomegaloviral disease, unspecified: Secondary | ICD-10-CM | POA: Diagnosis not present

## 2022-04-02 DIAGNOSIS — Z944 Liver transplant status: Secondary | ICD-10-CM | POA: Diagnosis not present

## 2022-04-02 DIAGNOSIS — Z940 Kidney transplant status: Secondary | ICD-10-CM | POA: Diagnosis not present

## 2022-04-02 DIAGNOSIS — T8643 Liver transplant infection: Secondary | ICD-10-CM | POA: Diagnosis not present

## 2022-04-02 DIAGNOSIS — R69 Illness, unspecified: Secondary | ICD-10-CM | POA: Diagnosis not present

## 2022-04-02 DIAGNOSIS — Z5181 Encounter for therapeutic drug level monitoring: Secondary | ICD-10-CM | POA: Diagnosis not present

## 2022-04-02 DIAGNOSIS — B349 Viral infection, unspecified: Secondary | ICD-10-CM | POA: Diagnosis not present

## 2022-05-10 DIAGNOSIS — D84821 Immunodeficiency due to drugs: Secondary | ICD-10-CM | POA: Diagnosis not present

## 2022-05-10 DIAGNOSIS — E7800 Pure hypercholesterolemia, unspecified: Secondary | ICD-10-CM | POA: Diagnosis not present

## 2022-05-10 DIAGNOSIS — Z79899 Other long term (current) drug therapy: Secondary | ICD-10-CM | POA: Diagnosis not present

## 2022-05-10 DIAGNOSIS — Z4822 Encounter for aftercare following kidney transplant: Secondary | ICD-10-CM | POA: Diagnosis not present

## 2022-05-10 DIAGNOSIS — D849 Immunodeficiency, unspecified: Secondary | ICD-10-CM | POA: Diagnosis not present

## 2022-05-10 DIAGNOSIS — Z87891 Personal history of nicotine dependence: Secondary | ICD-10-CM | POA: Diagnosis not present

## 2022-05-10 DIAGNOSIS — Z944 Liver transplant status: Secondary | ICD-10-CM | POA: Diagnosis not present

## 2022-05-10 DIAGNOSIS — R69 Illness, unspecified: Secondary | ICD-10-CM | POA: Diagnosis not present

## 2022-05-10 DIAGNOSIS — E213 Hyperparathyroidism, unspecified: Secondary | ICD-10-CM | POA: Diagnosis not present

## 2022-05-10 DIAGNOSIS — Z23 Encounter for immunization: Secondary | ICD-10-CM | POA: Diagnosis not present

## 2022-05-10 DIAGNOSIS — Z940 Kidney transplant status: Secondary | ICD-10-CM | POA: Diagnosis not present

## 2022-05-17 DIAGNOSIS — E785 Hyperlipidemia, unspecified: Secondary | ICD-10-CM | POA: Diagnosis not present

## 2022-05-17 DIAGNOSIS — M1991 Primary osteoarthritis, unspecified site: Secondary | ICD-10-CM | POA: Diagnosis not present

## 2022-05-17 DIAGNOSIS — Z940 Kidney transplant status: Secondary | ICD-10-CM | POA: Diagnosis not present

## 2022-05-17 DIAGNOSIS — Z944 Liver transplant status: Secondary | ICD-10-CM | POA: Diagnosis not present

## 2022-05-17 DIAGNOSIS — I129 Hypertensive chronic kidney disease with stage 1 through stage 4 chronic kidney disease, or unspecified chronic kidney disease: Secondary | ICD-10-CM | POA: Diagnosis not present

## 2022-05-17 DIAGNOSIS — N184 Chronic kidney disease, stage 4 (severe): Secondary | ICD-10-CM | POA: Diagnosis not present

## 2022-05-17 DIAGNOSIS — M12811 Other specific arthropathies, not elsewhere classified, right shoulder: Secondary | ICD-10-CM | POA: Diagnosis not present

## 2022-05-17 DIAGNOSIS — G720 Drug-induced myopathy: Secondary | ICD-10-CM | POA: Diagnosis not present

## 2022-05-17 DIAGNOSIS — K7460 Unspecified cirrhosis of liver: Secondary | ICD-10-CM | POA: Diagnosis not present

## 2022-05-17 DIAGNOSIS — M12812 Other specific arthropathies, not elsewhere classified, left shoulder: Secondary | ICD-10-CM | POA: Diagnosis not present

## 2022-05-17 DIAGNOSIS — E1122 Type 2 diabetes mellitus with diabetic chronic kidney disease: Secondary | ICD-10-CM | POA: Diagnosis not present

## 2022-05-17 DIAGNOSIS — J029 Acute pharyngitis, unspecified: Secondary | ICD-10-CM | POA: Diagnosis not present

## 2022-07-03 DIAGNOSIS — B259 Cytomegaloviral disease, unspecified: Secondary | ICD-10-CM | POA: Diagnosis not present

## 2022-07-03 DIAGNOSIS — N390 Urinary tract infection, site not specified: Secondary | ICD-10-CM | POA: Diagnosis not present

## 2022-07-03 DIAGNOSIS — B349 Viral infection, unspecified: Secondary | ICD-10-CM | POA: Diagnosis not present

## 2022-07-03 DIAGNOSIS — Z940 Kidney transplant status: Secondary | ICD-10-CM | POA: Diagnosis not present

## 2022-07-03 DIAGNOSIS — Z944 Liver transplant status: Secondary | ICD-10-CM | POA: Diagnosis not present

## 2022-07-03 DIAGNOSIS — Z5181 Encounter for therapeutic drug level monitoring: Secondary | ICD-10-CM | POA: Diagnosis not present

## 2022-07-19 DIAGNOSIS — Z6830 Body mass index (BMI) 30.0-30.9, adult: Secondary | ICD-10-CM | POA: Diagnosis not present

## 2022-07-19 DIAGNOSIS — M1991 Primary osteoarthritis, unspecified site: Secondary | ICD-10-CM | POA: Diagnosis not present

## 2022-07-19 DIAGNOSIS — E663 Overweight: Secondary | ICD-10-CM | POA: Diagnosis not present

## 2022-07-19 DIAGNOSIS — I5030 Unspecified diastolic (congestive) heart failure: Secondary | ICD-10-CM | POA: Diagnosis not present

## 2022-07-19 DIAGNOSIS — D696 Thrombocytopenia, unspecified: Secondary | ICD-10-CM | POA: Diagnosis not present

## 2022-07-19 DIAGNOSIS — I119 Hypertensive heart disease without heart failure: Secondary | ICD-10-CM | POA: Diagnosis not present

## 2022-07-19 DIAGNOSIS — M1711 Unilateral primary osteoarthritis, right knee: Secondary | ICD-10-CM | POA: Diagnosis not present

## 2022-07-19 DIAGNOSIS — I129 Hypertensive chronic kidney disease with stage 1 through stage 4 chronic kidney disease, or unspecified chronic kidney disease: Secondary | ICD-10-CM | POA: Diagnosis not present

## 2022-07-19 DIAGNOSIS — E1122 Type 2 diabetes mellitus with diabetic chronic kidney disease: Secondary | ICD-10-CM | POA: Diagnosis not present

## 2022-07-19 DIAGNOSIS — N184 Chronic kidney disease, stage 4 (severe): Secondary | ICD-10-CM | POA: Diagnosis not present

## 2022-07-31 DIAGNOSIS — M25561 Pain in right knee: Secondary | ICD-10-CM | POA: Diagnosis not present

## 2022-08-04 DIAGNOSIS — M25561 Pain in right knee: Secondary | ICD-10-CM | POA: Diagnosis not present

## 2022-08-07 DIAGNOSIS — M25561 Pain in right knee: Secondary | ICD-10-CM | POA: Diagnosis not present

## 2022-08-21 DIAGNOSIS — M1711 Unilateral primary osteoarthritis, right knee: Secondary | ICD-10-CM | POA: Diagnosis not present

## 2022-08-28 DIAGNOSIS — M1711 Unilateral primary osteoarthritis, right knee: Secondary | ICD-10-CM | POA: Diagnosis not present

## 2022-09-04 DIAGNOSIS — M1711 Unilateral primary osteoarthritis, right knee: Secondary | ICD-10-CM | POA: Diagnosis not present

## 2022-09-27 DIAGNOSIS — L304 Erythema intertrigo: Secondary | ICD-10-CM | POA: Diagnosis not present

## 2022-09-27 DIAGNOSIS — D2270 Melanocytic nevi of unspecified lower limb, including hip: Secondary | ICD-10-CM | POA: Diagnosis not present

## 2022-09-27 DIAGNOSIS — D2260 Melanocytic nevi of unspecified upper limb, including shoulder: Secondary | ICD-10-CM | POA: Diagnosis not present

## 2022-09-27 DIAGNOSIS — D849 Immunodeficiency, unspecified: Secondary | ICD-10-CM | POA: Diagnosis not present

## 2022-09-27 DIAGNOSIS — L918 Other hypertrophic disorders of the skin: Secondary | ICD-10-CM | POA: Diagnosis not present

## 2022-09-27 DIAGNOSIS — L814 Other melanin hyperpigmentation: Secondary | ICD-10-CM | POA: Diagnosis not present

## 2022-09-27 DIAGNOSIS — D225 Melanocytic nevi of trunk: Secondary | ICD-10-CM | POA: Diagnosis not present

## 2022-10-15 DIAGNOSIS — F5104 Psychophysiologic insomnia: Secondary | ICD-10-CM | POA: Diagnosis not present

## 2022-10-15 DIAGNOSIS — F32A Depression, unspecified: Secondary | ICD-10-CM | POA: Diagnosis not present

## 2022-10-15 DIAGNOSIS — Z940 Kidney transplant status: Secondary | ICD-10-CM | POA: Diagnosis not present

## 2022-10-15 DIAGNOSIS — F419 Anxiety disorder, unspecified: Secondary | ICD-10-CM | POA: Diagnosis not present

## 2022-10-18 DIAGNOSIS — Z1231 Encounter for screening mammogram for malignant neoplasm of breast: Secondary | ICD-10-CM | POA: Diagnosis not present

## 2022-11-22 DIAGNOSIS — D849 Immunodeficiency, unspecified: Secondary | ICD-10-CM | POA: Diagnosis not present

## 2022-11-22 DIAGNOSIS — F419 Anxiety disorder, unspecified: Secondary | ICD-10-CM | POA: Diagnosis not present

## 2022-11-22 DIAGNOSIS — F32A Depression, unspecified: Secondary | ICD-10-CM | POA: Diagnosis not present

## 2022-11-22 DIAGNOSIS — B1910 Unspecified viral hepatitis B without hepatic coma: Secondary | ICD-10-CM | POA: Diagnosis not present

## 2022-11-22 DIAGNOSIS — Z4822 Encounter for aftercare following kidney transplant: Secondary | ICD-10-CM | POA: Diagnosis not present

## 2022-11-22 DIAGNOSIS — Z944 Liver transplant status: Secondary | ICD-10-CM | POA: Diagnosis not present

## 2022-11-22 DIAGNOSIS — Z005 Encounter for examination of potential donor of organ and tissue: Secondary | ICD-10-CM | POA: Diagnosis not present

## 2022-11-22 DIAGNOSIS — Z87891 Personal history of nicotine dependence: Secondary | ICD-10-CM | POA: Diagnosis not present

## 2022-11-22 DIAGNOSIS — Z940 Kidney transplant status: Secondary | ICD-10-CM | POA: Diagnosis not present

## 2022-12-03 DIAGNOSIS — Z940 Kidney transplant status: Secondary | ICD-10-CM | POA: Diagnosis not present

## 2022-12-03 DIAGNOSIS — Z5181 Encounter for therapeutic drug level monitoring: Secondary | ICD-10-CM | POA: Diagnosis not present

## 2023-01-31 DIAGNOSIS — H43393 Other vitreous opacities, bilateral: Secondary | ICD-10-CM | POA: Diagnosis not present

## 2023-01-31 DIAGNOSIS — H26493 Other secondary cataract, bilateral: Secondary | ICD-10-CM | POA: Diagnosis not present

## 2023-01-31 DIAGNOSIS — H353130 Nonexudative age-related macular degeneration, bilateral, stage unspecified: Secondary | ICD-10-CM | POA: Diagnosis not present

## 2023-01-31 DIAGNOSIS — Z961 Presence of intraocular lens: Secondary | ICD-10-CM | POA: Diagnosis not present

## 2023-02-01 DIAGNOSIS — Z940 Kidney transplant status: Secondary | ICD-10-CM | POA: Diagnosis not present

## 2023-02-01 DIAGNOSIS — K580 Irritable bowel syndrome with diarrhea: Secondary | ICD-10-CM | POA: Diagnosis not present

## 2023-02-01 DIAGNOSIS — Z0000 Encounter for general adult medical examination without abnormal findings: Secondary | ICD-10-CM | POA: Diagnosis not present

## 2023-02-01 DIAGNOSIS — K7460 Unspecified cirrhosis of liver: Secondary | ICD-10-CM | POA: Diagnosis not present

## 2023-02-01 DIAGNOSIS — E785 Hyperlipidemia, unspecified: Secondary | ICD-10-CM | POA: Diagnosis not present

## 2023-02-01 DIAGNOSIS — I129 Hypertensive chronic kidney disease with stage 1 through stage 4 chronic kidney disease, or unspecified chronic kidney disease: Secondary | ICD-10-CM | POA: Diagnosis not present

## 2023-02-01 DIAGNOSIS — Z79899 Other long term (current) drug therapy: Secondary | ICD-10-CM | POA: Diagnosis not present

## 2023-02-01 DIAGNOSIS — Z23 Encounter for immunization: Secondary | ICD-10-CM | POA: Diagnosis not present

## 2023-02-01 DIAGNOSIS — Z944 Liver transplant status: Secondary | ICD-10-CM | POA: Diagnosis not present

## 2023-02-01 DIAGNOSIS — M8580 Other specified disorders of bone density and structure, unspecified site: Secondary | ICD-10-CM | POA: Diagnosis not present

## 2023-02-01 DIAGNOSIS — N184 Chronic kidney disease, stage 4 (severe): Secondary | ICD-10-CM | POA: Diagnosis not present

## 2023-02-01 DIAGNOSIS — E559 Vitamin D deficiency, unspecified: Secondary | ICD-10-CM | POA: Diagnosis not present

## 2023-02-01 DIAGNOSIS — E1122 Type 2 diabetes mellitus with diabetic chronic kidney disease: Secondary | ICD-10-CM | POA: Diagnosis not present

## 2023-02-15 DIAGNOSIS — E119 Type 2 diabetes mellitus without complications: Secondary | ICD-10-CM | POA: Diagnosis not present

## 2023-02-15 DIAGNOSIS — H353130 Nonexudative age-related macular degeneration, bilateral, stage unspecified: Secondary | ICD-10-CM | POA: Diagnosis not present

## 2023-02-15 DIAGNOSIS — H26493 Other secondary cataract, bilateral: Secondary | ICD-10-CM | POA: Diagnosis not present

## 2023-02-15 DIAGNOSIS — H18413 Arcus senilis, bilateral: Secondary | ICD-10-CM | POA: Diagnosis not present

## 2023-02-15 DIAGNOSIS — Z961 Presence of intraocular lens: Secondary | ICD-10-CM | POA: Diagnosis not present

## 2023-02-15 DIAGNOSIS — H43393 Other vitreous opacities, bilateral: Secondary | ICD-10-CM | POA: Diagnosis not present

## 2023-02-15 DIAGNOSIS — H26491 Other secondary cataract, right eye: Secondary | ICD-10-CM | POA: Diagnosis not present

## 2023-02-18 DIAGNOSIS — Z940 Kidney transplant status: Secondary | ICD-10-CM | POA: Diagnosis not present

## 2023-02-18 DIAGNOSIS — Z5181 Encounter for therapeutic drug level monitoring: Secondary | ICD-10-CM | POA: Diagnosis not present

## 2023-03-05 DIAGNOSIS — M25561 Pain in right knee: Secondary | ICD-10-CM | POA: Diagnosis not present

## 2023-03-16 DIAGNOSIS — D1621 Benign neoplasm of long bones of right lower limb: Secondary | ICD-10-CM | POA: Diagnosis not present

## 2023-03-16 DIAGNOSIS — M7121 Synovial cyst of popliteal space [Baker], right knee: Secondary | ICD-10-CM | POA: Diagnosis not present

## 2023-03-16 DIAGNOSIS — M1711 Unilateral primary osteoarthritis, right knee: Secondary | ICD-10-CM | POA: Diagnosis not present

## 2023-03-16 DIAGNOSIS — M25561 Pain in right knee: Secondary | ICD-10-CM | POA: Diagnosis not present

## 2023-03-16 DIAGNOSIS — M25461 Effusion, right knee: Secondary | ICD-10-CM | POA: Diagnosis not present

## 2023-03-16 DIAGNOSIS — S83241A Other tear of medial meniscus, current injury, right knee, initial encounter: Secondary | ICD-10-CM | POA: Diagnosis not present

## 2023-03-16 DIAGNOSIS — X58XXXA Exposure to other specified factors, initial encounter: Secondary | ICD-10-CM | POA: Diagnosis not present

## 2023-03-23 DIAGNOSIS — B349 Viral infection, unspecified: Secondary | ICD-10-CM | POA: Diagnosis not present

## 2023-04-11 DIAGNOSIS — S83241A Other tear of medial meniscus, current injury, right knee, initial encounter: Secondary | ICD-10-CM | POA: Diagnosis not present

## 2023-04-11 DIAGNOSIS — S83281A Other tear of lateral meniscus, current injury, right knee, initial encounter: Secondary | ICD-10-CM | POA: Diagnosis not present

## 2023-05-07 DIAGNOSIS — I340 Nonrheumatic mitral (valve) insufficiency: Secondary | ICD-10-CM

## 2023-05-07 DIAGNOSIS — I5030 Unspecified diastolic (congestive) heart failure: Secondary | ICD-10-CM | POA: Diagnosis not present

## 2023-06-05 DIAGNOSIS — N390 Urinary tract infection, site not specified: Secondary | ICD-10-CM | POA: Diagnosis not present

## 2023-06-17 DIAGNOSIS — Z87440 Personal history of urinary (tract) infections: Secondary | ICD-10-CM | POA: Diagnosis not present

## 2023-06-17 DIAGNOSIS — B1920 Unspecified viral hepatitis C without hepatic coma: Secondary | ICD-10-CM | POA: Diagnosis not present

## 2023-06-17 DIAGNOSIS — Z79621 Long term (current) use of calcineurin inhibitor: Secondary | ICD-10-CM | POA: Diagnosis not present

## 2023-06-17 DIAGNOSIS — Z940 Kidney transplant status: Secondary | ICD-10-CM | POA: Diagnosis not present

## 2023-06-17 DIAGNOSIS — F419 Anxiety disorder, unspecified: Secondary | ICD-10-CM | POA: Diagnosis not present

## 2023-06-17 DIAGNOSIS — Z48298 Encounter for aftercare following other organ transplant: Secondary | ICD-10-CM | POA: Diagnosis not present

## 2023-06-17 DIAGNOSIS — F418 Other specified anxiety disorders: Secondary | ICD-10-CM | POA: Diagnosis not present

## 2023-06-17 DIAGNOSIS — K9183 Postprocedural hepatorenal syndrome: Secondary | ICD-10-CM | POA: Diagnosis not present

## 2023-06-17 DIAGNOSIS — T8643 Liver transplant infection: Secondary | ICD-10-CM | POA: Diagnosis not present

## 2023-06-17 DIAGNOSIS — D84821 Immunodeficiency due to drugs: Secondary | ICD-10-CM | POA: Diagnosis not present

## 2023-06-17 DIAGNOSIS — F32A Depression, unspecified: Secondary | ICD-10-CM | POA: Diagnosis not present

## 2023-06-17 DIAGNOSIS — Y830 Surgical operation with transplant of whole organ as the cause of abnormal reaction of the patient, or of later complication, without mention of misadventure at the time of the procedure: Secondary | ICD-10-CM | POA: Diagnosis not present

## 2023-06-17 DIAGNOSIS — Z5181 Encounter for therapeutic drug level monitoring: Secondary | ICD-10-CM | POA: Diagnosis not present

## 2023-06-17 DIAGNOSIS — R300 Dysuria: Secondary | ICD-10-CM | POA: Diagnosis not present

## 2023-06-17 DIAGNOSIS — F5104 Psychophysiologic insomnia: Secondary | ICD-10-CM | POA: Diagnosis not present

## 2023-06-17 DIAGNOSIS — D849 Immunodeficiency, unspecified: Secondary | ICD-10-CM | POA: Diagnosis not present

## 2023-06-17 DIAGNOSIS — Z79624 Long term (current) use of inhibitors of nucleotide synthesis: Secondary | ICD-10-CM | POA: Diagnosis not present

## 2023-06-17 DIAGNOSIS — N1832 Chronic kidney disease, stage 3b: Secondary | ICD-10-CM | POA: Diagnosis not present

## 2023-07-09 DIAGNOSIS — M1710 Unilateral primary osteoarthritis, unspecified knee: Secondary | ICD-10-CM | POA: Diagnosis not present

## 2023-07-15 DIAGNOSIS — Z6831 Body mass index (BMI) 31.0-31.9, adult: Secondary | ICD-10-CM | POA: Diagnosis not present

## 2023-07-15 DIAGNOSIS — J3489 Other specified disorders of nose and nasal sinuses: Secondary | ICD-10-CM | POA: Diagnosis not present

## 2023-07-23 DIAGNOSIS — J40 Bronchitis, not specified as acute or chronic: Secondary | ICD-10-CM | POA: Diagnosis not present

## 2023-07-23 DIAGNOSIS — J329 Chronic sinusitis, unspecified: Secondary | ICD-10-CM | POA: Diagnosis not present

## 2023-09-13 DIAGNOSIS — K219 Gastro-esophageal reflux disease without esophagitis: Secondary | ICD-10-CM | POA: Diagnosis not present

## 2023-09-13 DIAGNOSIS — E785 Hyperlipidemia, unspecified: Secondary | ICD-10-CM | POA: Diagnosis not present

## 2023-09-13 DIAGNOSIS — Z7982 Long term (current) use of aspirin: Secondary | ICD-10-CM | POA: Diagnosis not present

## 2023-09-13 DIAGNOSIS — E213 Hyperparathyroidism, unspecified: Secondary | ICD-10-CM | POA: Diagnosis not present

## 2023-09-13 DIAGNOSIS — K7460 Unspecified cirrhosis of liver: Secondary | ICD-10-CM | POA: Diagnosis not present

## 2023-09-13 DIAGNOSIS — F325 Major depressive disorder, single episode, in full remission: Secondary | ICD-10-CM | POA: Diagnosis not present

## 2023-09-13 DIAGNOSIS — I700 Atherosclerosis of aorta: Secondary | ICD-10-CM | POA: Diagnosis not present

## 2023-09-13 DIAGNOSIS — E669 Obesity, unspecified: Secondary | ICD-10-CM | POA: Diagnosis not present

## 2023-09-13 DIAGNOSIS — N1832 Chronic kidney disease, stage 3b: Secondary | ICD-10-CM | POA: Diagnosis not present

## 2023-09-13 DIAGNOSIS — M1990 Unspecified osteoarthritis, unspecified site: Secondary | ICD-10-CM | POA: Diagnosis not present

## 2023-09-13 DIAGNOSIS — B181 Chronic viral hepatitis B without delta-agent: Secondary | ICD-10-CM | POA: Diagnosis not present

## 2023-09-13 DIAGNOSIS — Z008 Encounter for other general examination: Secondary | ICD-10-CM | POA: Diagnosis not present

## 2023-09-13 DIAGNOSIS — D84821 Immunodeficiency due to drugs: Secondary | ICD-10-CM | POA: Diagnosis not present

## 2023-10-03 DIAGNOSIS — L814 Other melanin hyperpigmentation: Secondary | ICD-10-CM | POA: Diagnosis not present

## 2023-10-03 DIAGNOSIS — L821 Other seborrheic keratosis: Secondary | ICD-10-CM | POA: Diagnosis not present

## 2023-10-03 DIAGNOSIS — D849 Immunodeficiency, unspecified: Secondary | ICD-10-CM | POA: Diagnosis not present

## 2023-10-03 DIAGNOSIS — L820 Inflamed seborrheic keratosis: Secondary | ICD-10-CM | POA: Diagnosis not present

## 2023-10-03 DIAGNOSIS — D225 Melanocytic nevi of trunk: Secondary | ICD-10-CM | POA: Diagnosis not present

## 2023-10-03 DIAGNOSIS — D2270 Melanocytic nevi of unspecified lower limb, including hip: Secondary | ICD-10-CM | POA: Diagnosis not present

## 2023-10-03 DIAGNOSIS — L304 Erythema intertrigo: Secondary | ICD-10-CM | POA: Diagnosis not present

## 2023-10-03 DIAGNOSIS — D2260 Melanocytic nevi of unspecified upper limb, including shoulder: Secondary | ICD-10-CM | POA: Diagnosis not present

## 2023-11-12 DIAGNOSIS — Z940 Kidney transplant status: Secondary | ICD-10-CM | POA: Diagnosis not present

## 2023-11-12 DIAGNOSIS — J301 Allergic rhinitis due to pollen: Secondary | ICD-10-CM | POA: Diagnosis not present

## 2023-11-12 DIAGNOSIS — N184 Chronic kidney disease, stage 4 (severe): Secondary | ICD-10-CM | POA: Diagnosis not present

## 2023-11-12 DIAGNOSIS — J209 Acute bronchitis, unspecified: Secondary | ICD-10-CM | POA: Diagnosis not present

## 2023-11-12 DIAGNOSIS — I129 Hypertensive chronic kidney disease with stage 1 through stage 4 chronic kidney disease, or unspecified chronic kidney disease: Secondary | ICD-10-CM | POA: Diagnosis not present

## 2023-11-12 DIAGNOSIS — K766 Portal hypertension: Secondary | ICD-10-CM | POA: Diagnosis not present

## 2023-11-12 DIAGNOSIS — Z944 Liver transplant status: Secondary | ICD-10-CM | POA: Diagnosis not present

## 2023-11-12 DIAGNOSIS — K7460 Unspecified cirrhosis of liver: Secondary | ICD-10-CM | POA: Diagnosis not present

## 2023-11-12 DIAGNOSIS — E1122 Type 2 diabetes mellitus with diabetic chronic kidney disease: Secondary | ICD-10-CM | POA: Diagnosis not present

## 2023-11-12 DIAGNOSIS — I5030 Unspecified diastolic (congestive) heart failure: Secondary | ICD-10-CM | POA: Diagnosis not present

## 2023-11-15 DIAGNOSIS — R051 Acute cough: Secondary | ICD-10-CM | POA: Diagnosis not present

## 2023-11-15 DIAGNOSIS — R070 Pain in throat: Secondary | ICD-10-CM | POA: Diagnosis not present

## 2023-11-15 DIAGNOSIS — R509 Fever, unspecified: Secondary | ICD-10-CM | POA: Diagnosis not present

## 2023-11-15 DIAGNOSIS — H9203 Otalgia, bilateral: Secondary | ICD-10-CM | POA: Diagnosis not present

## 2023-11-15 DIAGNOSIS — R519 Headache, unspecified: Secondary | ICD-10-CM | POA: Diagnosis not present

## 2023-11-15 DIAGNOSIS — R0981 Nasal congestion: Secondary | ICD-10-CM | POA: Diagnosis not present

## 2023-11-21 DIAGNOSIS — Z6829 Body mass index (BMI) 29.0-29.9, adult: Secondary | ICD-10-CM | POA: Diagnosis not present

## 2023-11-21 DIAGNOSIS — S99922A Unspecified injury of left foot, initial encounter: Secondary | ICD-10-CM | POA: Diagnosis not present

## 2023-11-21 DIAGNOSIS — E663 Overweight: Secondary | ICD-10-CM | POA: Diagnosis not present

## 2023-11-21 DIAGNOSIS — J209 Acute bronchitis, unspecified: Secondary | ICD-10-CM | POA: Diagnosis not present

## 2023-11-29 ENCOUNTER — Telehealth: Payer: Self-pay

## 2023-11-29 ENCOUNTER — Other Ambulatory Visit (HOSPITAL_COMMUNITY): Payer: Self-pay

## 2023-11-29 NOTE — Telephone Encounter (Signed)
 PAP: Application for Ozempic has been submitted to [employer], online  I have faxed the provider portion of the application to Dr. Medford Rubens at [facility]

## 2023-12-02 NOTE — Telephone Encounter (Signed)
 Received provider form from Dr. Rusty shed to [employer]

## 2023-12-03 DIAGNOSIS — M1711 Unilateral primary osteoarthritis, right knee: Secondary | ICD-10-CM | POA: Diagnosis not present

## 2023-12-05 DIAGNOSIS — Z01818 Encounter for other preprocedural examination: Secondary | ICD-10-CM | POA: Diagnosis not present

## 2023-12-05 NOTE — Telephone Encounter (Signed)
 PAP: Patient assistance application for Ozempic has been approved by [employer]: [employer] from 12/05/2023 to 1231/2025. Medication should be delivered to PAP Delivery: Provider's office. For further shipping updates, please contact [employer] at [phone]. Patient ID is: [id number]

## 2023-12-05 NOTE — Progress Notes (Signed)
 Pharmacy Medication Assistance Program Note    12/05/2023  Patient ID: Loretta [name], female   DOB: 1955-05-19, 68 y.o.   MRN: [id number]     11/29/2023  Outreach [employer]  Manufacturer [employer]ication One [employer]  [employer] Drugs Ozempic  Dose of Ozempic 0.5  Type of Radiographer, therapeutic Assistance  Date Application Sent to Prescriber 11/29/2023  Name of Prescriber [employer]  Application Items Received From Patient Application  Date Application Received From Provider 12/02/2023  Date Application Submitted to Manufacturer 12/02/2023  Method Application Sent to Manufacturer Fax  Patient Assistance Determination Approved  Approval Start Date 12/05/2023  Approval End Date 05/06/2024  Patient Notification Method Telephone Call     Signature

## 2023-12-11 DIAGNOSIS — I700 Atherosclerosis of aorta: Secondary | ICD-10-CM | POA: Diagnosis not present

## 2023-12-11 DIAGNOSIS — R319 Hematuria, unspecified: Secondary | ICD-10-CM | POA: Diagnosis not present

## 2023-12-11 DIAGNOSIS — N390 Urinary tract infection, site not specified: Secondary | ICD-10-CM | POA: Diagnosis not present

## 2023-12-11 DIAGNOSIS — Z0389 Encounter for observation for other suspected diseases and conditions ruled out: Secondary | ICD-10-CM | POA: Diagnosis not present

## 2023-12-11 DIAGNOSIS — R531 Weakness: Secondary | ICD-10-CM | POA: Diagnosis not present

## 2023-12-12 DIAGNOSIS — I959 Hypotension, unspecified: Secondary | ICD-10-CM | POA: Diagnosis not present

## 2023-12-12 DIAGNOSIS — N390 Urinary tract infection, site not specified: Secondary | ICD-10-CM | POA: Diagnosis not present

## 2023-12-12 DIAGNOSIS — Z0389 Encounter for observation for other suspected diseases and conditions ruled out: Secondary | ICD-10-CM | POA: Diagnosis not present

## 2023-12-12 DIAGNOSIS — I700 Atherosclerosis of aorta: Secondary | ICD-10-CM | POA: Diagnosis not present

## 2023-12-18 DIAGNOSIS — M1711 Unilateral primary osteoarthritis, right knee: Secondary | ICD-10-CM | POA: Diagnosis not present

## 2023-12-23 DIAGNOSIS — Z471 Aftercare following joint replacement surgery: Secondary | ICD-10-CM | POA: Diagnosis not present

## 2023-12-23 DIAGNOSIS — Z96651 Presence of right artificial knee joint: Secondary | ICD-10-CM | POA: Diagnosis not present

## 2024-01-01 DIAGNOSIS — Z96651 Presence of right artificial knee joint: Secondary | ICD-10-CM | POA: Diagnosis not present

## 2024-01-01 DIAGNOSIS — Z471 Aftercare following joint replacement surgery: Secondary | ICD-10-CM | POA: Diagnosis not present

## 2024-01-03 DIAGNOSIS — M25661 Stiffness of right knee, not elsewhere classified: Secondary | ICD-10-CM | POA: Diagnosis not present

## 2024-01-03 DIAGNOSIS — Z96651 Presence of right artificial knee joint: Secondary | ICD-10-CM | POA: Diagnosis not present

## 2024-01-03 DIAGNOSIS — R2689 Other abnormalities of gait and mobility: Secondary | ICD-10-CM | POA: Diagnosis not present

## 2024-01-03 DIAGNOSIS — M25561 Pain in right knee: Secondary | ICD-10-CM | POA: Diagnosis not present

## 2024-01-08 DIAGNOSIS — M25561 Pain in right knee: Secondary | ICD-10-CM | POA: Diagnosis not present

## 2024-01-08 DIAGNOSIS — M25661 Stiffness of right knee, not elsewhere classified: Secondary | ICD-10-CM | POA: Diagnosis not present

## 2024-01-08 DIAGNOSIS — Z96651 Presence of right artificial knee joint: Secondary | ICD-10-CM | POA: Diagnosis not present

## 2024-01-08 DIAGNOSIS — R2689 Other abnormalities of gait and mobility: Secondary | ICD-10-CM | POA: Diagnosis not present

## 2024-01-10 DIAGNOSIS — R2689 Other abnormalities of gait and mobility: Secondary | ICD-10-CM | POA: Diagnosis not present

## 2024-01-10 DIAGNOSIS — Z96651 Presence of right artificial knee joint: Secondary | ICD-10-CM | POA: Diagnosis not present

## 2024-01-10 DIAGNOSIS — M25561 Pain in right knee: Secondary | ICD-10-CM | POA: Diagnosis not present

## 2024-01-10 DIAGNOSIS — M25661 Stiffness of right knee, not elsewhere classified: Secondary | ICD-10-CM | POA: Diagnosis not present

## 2024-01-13 DIAGNOSIS — M25661 Stiffness of right knee, not elsewhere classified: Secondary | ICD-10-CM | POA: Diagnosis not present

## 2024-01-13 DIAGNOSIS — R2689 Other abnormalities of gait and mobility: Secondary | ICD-10-CM | POA: Diagnosis not present

## 2024-01-13 DIAGNOSIS — Z96651 Presence of right artificial knee joint: Secondary | ICD-10-CM | POA: Diagnosis not present

## 2024-01-13 DIAGNOSIS — M25561 Pain in right knee: Secondary | ICD-10-CM | POA: Diagnosis not present

## 2024-01-17 DIAGNOSIS — M25661 Stiffness of right knee, not elsewhere classified: Secondary | ICD-10-CM | POA: Diagnosis not present

## 2024-01-17 DIAGNOSIS — M25561 Pain in right knee: Secondary | ICD-10-CM | POA: Diagnosis not present

## 2024-01-17 DIAGNOSIS — R2689 Other abnormalities of gait and mobility: Secondary | ICD-10-CM | POA: Diagnosis not present

## 2024-01-17 DIAGNOSIS — Z96651 Presence of right artificial knee joint: Secondary | ICD-10-CM | POA: Diagnosis not present

## 2024-01-20 DIAGNOSIS — R2689 Other abnormalities of gait and mobility: Secondary | ICD-10-CM | POA: Diagnosis not present

## 2024-01-20 DIAGNOSIS — M25661 Stiffness of right knee, not elsewhere classified: Secondary | ICD-10-CM | POA: Diagnosis not present

## 2024-01-20 DIAGNOSIS — M25561 Pain in right knee: Secondary | ICD-10-CM | POA: Diagnosis not present

## 2024-01-20 DIAGNOSIS — Z96651 Presence of right artificial knee joint: Secondary | ICD-10-CM | POA: Diagnosis not present

## 2024-01-24 DIAGNOSIS — Z944 Liver transplant status: Secondary | ICD-10-CM | POA: Diagnosis not present

## 2024-01-24 DIAGNOSIS — Z96651 Presence of right artificial knee joint: Secondary | ICD-10-CM | POA: Diagnosis not present

## 2024-01-24 DIAGNOSIS — F419 Anxiety disorder, unspecified: Secondary | ICD-10-CM | POA: Diagnosis not present

## 2024-01-24 DIAGNOSIS — R2689 Other abnormalities of gait and mobility: Secondary | ICD-10-CM | POA: Diagnosis not present

## 2024-01-24 DIAGNOSIS — F5104 Psychophysiologic insomnia: Secondary | ICD-10-CM | POA: Diagnosis not present

## 2024-01-24 DIAGNOSIS — Z940 Kidney transplant status: Secondary | ICD-10-CM | POA: Diagnosis not present

## 2024-01-24 DIAGNOSIS — M25661 Stiffness of right knee, not elsewhere classified: Secondary | ICD-10-CM | POA: Diagnosis not present

## 2024-01-24 DIAGNOSIS — F32A Depression, unspecified: Secondary | ICD-10-CM | POA: Diagnosis not present

## 2024-01-24 DIAGNOSIS — M25561 Pain in right knee: Secondary | ICD-10-CM | POA: Diagnosis not present

## 2024-01-27 DIAGNOSIS — R2689 Other abnormalities of gait and mobility: Secondary | ICD-10-CM | POA: Diagnosis not present

## 2024-01-27 DIAGNOSIS — M25561 Pain in right knee: Secondary | ICD-10-CM | POA: Diagnosis not present

## 2024-01-27 DIAGNOSIS — Z96651 Presence of right artificial knee joint: Secondary | ICD-10-CM | POA: Diagnosis not present

## 2024-01-27 DIAGNOSIS — M25661 Stiffness of right knee, not elsewhere classified: Secondary | ICD-10-CM | POA: Diagnosis not present

## 2024-01-30 DIAGNOSIS — Z471 Aftercare following joint replacement surgery: Secondary | ICD-10-CM | POA: Diagnosis not present

## 2024-01-30 DIAGNOSIS — Z96651 Presence of right artificial knee joint: Secondary | ICD-10-CM | POA: Diagnosis not present

## 2024-01-31 DIAGNOSIS — M25561 Pain in right knee: Secondary | ICD-10-CM | POA: Diagnosis not present

## 2024-01-31 DIAGNOSIS — M25661 Stiffness of right knee, not elsewhere classified: Secondary | ICD-10-CM | POA: Diagnosis not present

## 2024-01-31 DIAGNOSIS — Z96651 Presence of right artificial knee joint: Secondary | ICD-10-CM | POA: Diagnosis not present

## 2024-01-31 DIAGNOSIS — R2689 Other abnormalities of gait and mobility: Secondary | ICD-10-CM | POA: Diagnosis not present

## 2024-02-03 DIAGNOSIS — M25561 Pain in right knee: Secondary | ICD-10-CM | POA: Diagnosis not present

## 2024-02-03 DIAGNOSIS — Z96651 Presence of right artificial knee joint: Secondary | ICD-10-CM | POA: Diagnosis not present

## 2024-02-03 DIAGNOSIS — M25661 Stiffness of right knee, not elsewhere classified: Secondary | ICD-10-CM | POA: Diagnosis not present

## 2024-02-03 DIAGNOSIS — R2689 Other abnormalities of gait and mobility: Secondary | ICD-10-CM | POA: Diagnosis not present

## 2024-02-06 DIAGNOSIS — Z96651 Presence of right artificial knee joint: Secondary | ICD-10-CM | POA: Diagnosis not present

## 2024-02-06 DIAGNOSIS — R2689 Other abnormalities of gait and mobility: Secondary | ICD-10-CM | POA: Diagnosis not present

## 2024-02-06 DIAGNOSIS — M25561 Pain in right knee: Secondary | ICD-10-CM | POA: Diagnosis not present

## 2024-02-06 DIAGNOSIS — M25661 Stiffness of right knee, not elsewhere classified: Secondary | ICD-10-CM | POA: Diagnosis not present

## 2024-02-10 DIAGNOSIS — Z96651 Presence of right artificial knee joint: Secondary | ICD-10-CM | POA: Diagnosis not present

## 2024-02-10 DIAGNOSIS — M25561 Pain in right knee: Secondary | ICD-10-CM | POA: Diagnosis not present

## 2024-02-10 DIAGNOSIS — R2689 Other abnormalities of gait and mobility: Secondary | ICD-10-CM | POA: Diagnosis not present

## 2024-02-10 DIAGNOSIS — M25661 Stiffness of right knee, not elsewhere classified: Secondary | ICD-10-CM | POA: Diagnosis not present

## 2024-02-12 DIAGNOSIS — R2689 Other abnormalities of gait and mobility: Secondary | ICD-10-CM | POA: Diagnosis not present

## 2024-02-12 DIAGNOSIS — Z96651 Presence of right artificial knee joint: Secondary | ICD-10-CM | POA: Diagnosis not present

## 2024-02-12 DIAGNOSIS — M25561 Pain in right knee: Secondary | ICD-10-CM | POA: Diagnosis not present

## 2024-02-12 DIAGNOSIS — M25661 Stiffness of right knee, not elsewhere classified: Secondary | ICD-10-CM | POA: Diagnosis not present

## 2024-02-17 DIAGNOSIS — M25661 Stiffness of right knee, not elsewhere classified: Secondary | ICD-10-CM | POA: Diagnosis not present

## 2024-02-17 DIAGNOSIS — Z96651 Presence of right artificial knee joint: Secondary | ICD-10-CM | POA: Diagnosis not present

## 2024-02-17 DIAGNOSIS — R2689 Other abnormalities of gait and mobility: Secondary | ICD-10-CM | POA: Diagnosis not present

## 2024-02-17 DIAGNOSIS — M25561 Pain in right knee: Secondary | ICD-10-CM | POA: Diagnosis not present

## 2024-02-20 DIAGNOSIS — N183 Chronic kidney disease, stage 3 unspecified: Secondary | ICD-10-CM | POA: Diagnosis not present

## 2024-02-20 DIAGNOSIS — Z944 Liver transplant status: Secondary | ICD-10-CM | POA: Diagnosis not present

## 2024-02-20 DIAGNOSIS — B1920 Unspecified viral hepatitis C without hepatic coma: Secondary | ICD-10-CM | POA: Diagnosis not present

## 2024-02-20 DIAGNOSIS — Z87440 Personal history of urinary (tract) infections: Secondary | ICD-10-CM | POA: Diagnosis not present

## 2024-02-20 DIAGNOSIS — D849 Immunodeficiency, unspecified: Secondary | ICD-10-CM | POA: Diagnosis not present

## 2024-02-20 DIAGNOSIS — I10 Essential (primary) hypertension: Secondary | ICD-10-CM | POA: Diagnosis not present

## 2024-02-20 DIAGNOSIS — Z6828 Body mass index (BMI) 28.0-28.9, adult: Secondary | ICD-10-CM | POA: Diagnosis not present

## 2024-02-20 DIAGNOSIS — Z23 Encounter for immunization: Secondary | ICD-10-CM | POA: Diagnosis not present

## 2024-02-20 DIAGNOSIS — B1910 Unspecified viral hepatitis B without hepatic coma: Secondary | ICD-10-CM | POA: Diagnosis not present

## 2024-02-20 DIAGNOSIS — T8643 Liver transplant infection: Secondary | ICD-10-CM | POA: Diagnosis not present

## 2024-02-20 DIAGNOSIS — R799 Abnormal finding of blood chemistry, unspecified: Secondary | ICD-10-CM | POA: Diagnosis not present

## 2024-02-21 DIAGNOSIS — R2689 Other abnormalities of gait and mobility: Secondary | ICD-10-CM | POA: Diagnosis not present

## 2024-02-21 DIAGNOSIS — M25661 Stiffness of right knee, not elsewhere classified: Secondary | ICD-10-CM | POA: Diagnosis not present

## 2024-02-21 DIAGNOSIS — M25561 Pain in right knee: Secondary | ICD-10-CM | POA: Diagnosis not present

## 2024-02-21 DIAGNOSIS — Z96651 Presence of right artificial knee joint: Secondary | ICD-10-CM | POA: Diagnosis not present

## 2024-02-24 DIAGNOSIS — Z9181 History of falling: Secondary | ICD-10-CM | POA: Diagnosis not present

## 2024-02-24 DIAGNOSIS — I130 Hypertensive heart and chronic kidney disease with heart failure and stage 1 through stage 4 chronic kidney disease, or unspecified chronic kidney disease: Secondary | ICD-10-CM | POA: Diagnosis not present

## 2024-02-24 DIAGNOSIS — E1122 Type 2 diabetes mellitus with diabetic chronic kidney disease: Secondary | ICD-10-CM | POA: Diagnosis not present

## 2024-02-24 DIAGNOSIS — J209 Acute bronchitis, unspecified: Secondary | ICD-10-CM | POA: Diagnosis not present

## 2024-02-24 DIAGNOSIS — K7460 Unspecified cirrhosis of liver: Secondary | ICD-10-CM | POA: Diagnosis not present

## 2024-02-24 DIAGNOSIS — M25561 Pain in right knee: Secondary | ICD-10-CM | POA: Diagnosis not present

## 2024-02-24 DIAGNOSIS — Z96651 Presence of right artificial knee joint: Secondary | ICD-10-CM | POA: Diagnosis not present

## 2024-02-24 DIAGNOSIS — Z1331 Encounter for screening for depression: Secondary | ICD-10-CM | POA: Diagnosis not present

## 2024-02-24 DIAGNOSIS — Z1339 Encounter for screening examination for other mental health and behavioral disorders: Secondary | ICD-10-CM | POA: Diagnosis not present

## 2024-02-24 DIAGNOSIS — M25661 Stiffness of right knee, not elsewhere classified: Secondary | ICD-10-CM | POA: Diagnosis not present

## 2024-02-24 DIAGNOSIS — Z0000 Encounter for general adult medical examination without abnormal findings: Secondary | ICD-10-CM | POA: Diagnosis not present

## 2024-02-24 DIAGNOSIS — I5032 Chronic diastolic (congestive) heart failure: Secondary | ICD-10-CM | POA: Diagnosis not present

## 2024-02-24 DIAGNOSIS — R2689 Other abnormalities of gait and mobility: Secondary | ICD-10-CM | POA: Diagnosis not present

## 2024-02-24 DIAGNOSIS — I5030 Unspecified diastolic (congestive) heart failure: Secondary | ICD-10-CM | POA: Diagnosis not present

## 2024-02-24 DIAGNOSIS — N184 Chronic kidney disease, stage 4 (severe): Secondary | ICD-10-CM | POA: Diagnosis not present

## 2024-02-24 DIAGNOSIS — I129 Hypertensive chronic kidney disease with stage 1 through stage 4 chronic kidney disease, or unspecified chronic kidney disease: Secondary | ICD-10-CM | POA: Diagnosis not present

## 2024-03-02 DIAGNOSIS — M25661 Stiffness of right knee, not elsewhere classified: Secondary | ICD-10-CM | POA: Diagnosis not present

## 2024-03-02 DIAGNOSIS — R2689 Other abnormalities of gait and mobility: Secondary | ICD-10-CM | POA: Diagnosis not present

## 2024-03-02 DIAGNOSIS — Z96651 Presence of right artificial knee joint: Secondary | ICD-10-CM | POA: Diagnosis not present

## 2024-03-02 DIAGNOSIS — M25561 Pain in right knee: Secondary | ICD-10-CM | POA: Diagnosis not present

## 2024-03-11 DIAGNOSIS — M25561 Pain in right knee: Secondary | ICD-10-CM | POA: Diagnosis not present

## 2024-03-11 DIAGNOSIS — Z96651 Presence of right artificial knee joint: Secondary | ICD-10-CM | POA: Diagnosis not present

## 2024-03-11 DIAGNOSIS — M25661 Stiffness of right knee, not elsewhere classified: Secondary | ICD-10-CM | POA: Diagnosis not present

## 2024-03-11 DIAGNOSIS — R2689 Other abnormalities of gait and mobility: Secondary | ICD-10-CM | POA: Diagnosis not present

## 2024-03-17 DIAGNOSIS — Z940 Kidney transplant status: Secondary | ICD-10-CM | POA: Diagnosis not present

## 2024-03-17 DIAGNOSIS — R399 Unspecified symptoms and signs involving the genitourinary system: Secondary | ICD-10-CM | POA: Diagnosis not present

## 2024-03-26 DIAGNOSIS — Z96651 Presence of right artificial knee joint: Secondary | ICD-10-CM | POA: Diagnosis not present
